# Patient Record
Sex: Female | Born: 1970 | Race: White | Hispanic: No | Marital: Married | Smoking: Never smoker
Health system: Southern US, Community
[De-identification: ages and names within clinical notes are randomized; demographics above are authoritative.]

## PROBLEM LIST (undated history)

## (undated) DIAGNOSIS — R32 Unspecified urinary incontinence: Secondary | ICD-10-CM

## (undated) DIAGNOSIS — G379 Demyelinating disease of central nervous system, unspecified: Secondary | ICD-10-CM

## (undated) DIAGNOSIS — Z9289 Personal history of other medical treatment: Secondary | ICD-10-CM

## (undated) DIAGNOSIS — K219 Gastro-esophageal reflux disease without esophagitis: Secondary | ICD-10-CM

## (undated) DIAGNOSIS — J309 Allergic rhinitis, unspecified: Secondary | ICD-10-CM

## (undated) DIAGNOSIS — R7303 Prediabetes: Secondary | ICD-10-CM

## (undated) DIAGNOSIS — E7800 Pure hypercholesterolemia, unspecified: Secondary | ICD-10-CM

## (undated) DIAGNOSIS — I959 Hypotension, unspecified: Secondary | ICD-10-CM

## (undated) DIAGNOSIS — Q282 Arteriovenous malformation of cerebral vessels: Secondary | ICD-10-CM

## (undated) DIAGNOSIS — F419 Anxiety disorder, unspecified: Secondary | ICD-10-CM

## (undated) DIAGNOSIS — E039 Hypothyroidism, unspecified: Secondary | ICD-10-CM

## (undated) HISTORY — DX: Gastro-esophageal reflux disease without esophagitis: K21.9

## (undated) HISTORY — DX: Unspecified urinary incontinence: R32

## (undated) HISTORY — DX: Personal history of other medical treatment: Z92.89

## (undated) HISTORY — DX: Allergic rhinitis, unspecified: J30.9

## (undated) HISTORY — PX: REFRACTIVE SURGERY: SHX103

## (undated) HISTORY — PX: NASAL SEPTUM SURGERY: SHX37

## (undated) HISTORY — PX: BLADDER SURGERY: SHX569

## (undated) HISTORY — PX: TONSILLECTOMY: SUR1361

## (undated) HISTORY — DX: Pure hypercholesterolemia, unspecified: E78.00

## (undated) HISTORY — DX: Hypothyroidism, unspecified: E03.9

## (undated) HISTORY — PX: MOUTH SURGERY: SHX715

## (undated) HISTORY — DX: Demyelinating disease of central nervous system, unspecified: G37.9

---
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
## 2008-12-01 DIAGNOSIS — G378 Other specified demyelinating diseases of central nervous system: Secondary | ICD-10-CM

## 2008-12-01 DIAGNOSIS — G3789 Other specified demyelinating diseases of central nervous system: Secondary | ICD-10-CM

## 2008-12-01 HISTORY — DX: Other specified demyelinating diseases of central nervous system: G37.89

## 2008-12-01 HISTORY — DX: Other specified demyelinating diseases of central nervous system: G37.8

## 2010-03-31 ENCOUNTER — Ambulatory Visit: Payer: Self-pay | Admitting: Neurology

## 2011-04-26 ENCOUNTER — Ambulatory Visit: Payer: Self-pay | Admitting: Psychiatry

## 2011-05-08 ENCOUNTER — Encounter: Payer: Self-pay | Admitting: Maternal & Fetal Medicine

## 2012-01-19 ENCOUNTER — Emergency Department: Payer: Self-pay | Admitting: Emergency Medicine

## 2012-01-19 LAB — CBC WITH DIFFERENTIAL/PLATELET
Basophil %: 0.4 %
Eosinophil #: 0.2 10*3/uL (ref 0.0–0.7)
Eosinophil %: 1.8 %
HCT: 43.9 % (ref 35.0–47.0)
HGB: 15 g/dL (ref 12.0–16.0)
Lymphocyte %: 21 %
MCHC: 34.2 g/dL (ref 32.0–36.0)
Monocyte %: 9.2 %
Neutrophil #: 7.1 10*3/uL — ABNORMAL HIGH (ref 1.4–6.5)
Neutrophil %: 67.6 %
RBC: 5.02 10*6/uL (ref 3.80–5.20)
RDW: 12.7 % (ref 11.5–14.5)
WBC: 10.5 10*3/uL (ref 3.6–11.0)

## 2012-01-19 LAB — URINALYSIS, COMPLETE
Bacteria: NONE SEEN
Glucose,UR: NEGATIVE mg/dL (ref 0–75)
Leukocyte Esterase: NEGATIVE
Nitrite: NEGATIVE
Protein: NEGATIVE
RBC,UR: 2 /HPF (ref 0–5)
WBC UR: 1 /HPF (ref 0–5)

## 2012-01-19 LAB — COMPREHENSIVE METABOLIC PANEL
Anion Gap: 4 — ABNORMAL LOW (ref 7–16)
Bilirubin,Total: 0.2 mg/dL (ref 0.2–1.0)
Calcium, Total: 8.6 mg/dL (ref 8.5–10.1)
Chloride: 105 mmol/L (ref 98–107)
Co2: 28 mmol/L (ref 21–32)
Creatinine: 0.75 mg/dL (ref 0.60–1.30)
EGFR (African American): >60
EGFR (Non-African Amer.): >60
Osmolality: 274 (ref 275–301)
Potassium: 4.3 mmol/L (ref 3.5–5.1)
Sodium: 137 mmol/L (ref 136–145)
Total Protein: 7.7 g/dL (ref 6.4–8.2)

## 2012-01-19 LAB — LIPASE, BLOOD: Lipase: 177 U/L (ref 73–393)

## 2012-03-15 ENCOUNTER — Ambulatory Visit: Payer: Self-pay | Admitting: Psychiatry

## 2012-11-18 ENCOUNTER — Encounter: Payer: Self-pay | Admitting: Obstetrics & Gynecology

## 2012-11-29 ENCOUNTER — Encounter: Payer: Self-pay | Admitting: Maternal & Fetal Medicine

## 2013-03-17 ENCOUNTER — Encounter: Payer: Self-pay | Admitting: Maternal & Fetal Medicine

## 2013-03-28 LAB — CBC WITH DIFFERENTIAL/PLATELET
Basophil %: 0.5 %
HCT: 35 % (ref 35.0–47.0)
Lymphocyte %: 14.8 %
MCHC: 34.1 g/dL (ref 32.0–36.0)
Monocyte #: 1.2 x10 3/mm — ABNORMAL HIGH (ref 0.2–0.9)
Neutrophil #: 10.4 10*3/uL — ABNORMAL HIGH (ref 1.4–6.5)
Neutrophil %: 74.8 %
Platelet: 241 10*3/uL (ref 150–440)
RDW: 13.7 % (ref 11.5–14.5)
WBC: 13.9 10*3/uL — ABNORMAL HIGH (ref 3.6–11.0)

## 2013-03-29 ENCOUNTER — Inpatient Hospital Stay: Payer: Self-pay

## 2013-03-30 LAB — CBC WITH DIFFERENTIAL/PLATELET
Basophil %: 0.4 %
Eosinophil #: 0 10*3/uL (ref 0.0–0.7)
HGB: 11.4 g/dL — ABNORMAL LOW (ref 12.0–16.0)
Lymphocyte #: 2 10*3/uL (ref 1.0–3.6)
MCH: 30.1 pg (ref 26.0–34.0)
MCV: 86 fL (ref 80–100)
Monocyte #: 1.1 x10 3/mm — ABNORMAL HIGH (ref 0.2–0.9)
Monocyte %: 6.7 %
Neutrophil #: 13.1 10*3/uL — ABNORMAL HIGH (ref 1.4–6.5)
Platelet: 251 10*3/uL (ref 150–440)
RDW: 13.6 % (ref 11.5–14.5)

## 2013-03-31 LAB — CBC WITH DIFFERENTIAL/PLATELET
Basophil #: 0 10*3/uL (ref 0.0–0.1)
Basophil %: 0.2 %
Eosinophil %: 0.7 %
HCT: 31.8 % — ABNORMAL LOW (ref 35.0–47.0)
HGB: 11 g/dL — ABNORMAL LOW (ref 12.0–16.0)
Lymphocyte #: 2.8 10*3/uL (ref 1.0–3.6)
MCH: 29.8 pg (ref 26.0–34.0)
MCHC: 34.6 g/dL (ref 32.0–36.0)
MCV: 86 fL (ref 80–100)
Monocyte %: 9.2 %
Neutrophil %: 69.9 %
Platelet: 232 10*3/uL (ref 150–440)
RDW: 13.9 % (ref 11.5–14.5)
WBC: 14.1 10*3/uL — ABNORMAL HIGH (ref 3.6–11.0)

## 2013-04-01 LAB — CBC WITH DIFFERENTIAL/PLATELET
Basophil #: 0 10*3/uL (ref 0.0–0.1)
Eosinophil #: 0.2 10*3/uL (ref 0.0–0.7)
Eosinophil %: 1.3 %
HCT: 31.9 % — ABNORMAL LOW (ref 35.0–47.0)
Lymphocyte #: 2.8 10*3/uL (ref 1.0–3.6)
Lymphocyte %: 18 %
MCH: 30.3 pg (ref 26.0–34.0)
MCHC: 35.3 g/dL (ref 32.0–36.0)
Monocyte #: 1.3 x10 3/mm — ABNORMAL HIGH (ref 0.2–0.9)
Monocyte %: 8.3 %
Neutrophil #: 11.4 10*3/uL — ABNORMAL HIGH (ref 1.4–6.5)
Neutrophil %: 72.1 %
RBC: 3.71 10*6/uL — ABNORMAL LOW (ref 3.80–5.20)
RDW: 13.7 % (ref 11.5–14.5)

## 2013-04-04 DIAGNOSIS — E034 Atrophy of thyroid (acquired): Secondary | ICD-10-CM | POA: Insufficient documentation

## 2013-04-04 DIAGNOSIS — Q283 Other malformations of cerebral vessels: Secondary | ICD-10-CM | POA: Insufficient documentation

## 2013-06-25 ENCOUNTER — Emergency Department: Payer: Self-pay | Admitting: Emergency Medicine

## 2013-09-06 ENCOUNTER — Ambulatory Visit: Payer: Self-pay

## 2014-04-26 ENCOUNTER — Ambulatory Visit: Payer: Self-pay | Admitting: Psychiatry

## 2014-07-18 DIAGNOSIS — Z8751 Personal history of pre-term labor: Secondary | ICD-10-CM | POA: Insufficient documentation

## 2015-03-20 NOTE — Consult Note (Signed)
Referral Information:   Reason for Referral 44 year old referred for preconception counseling, due to history of mylelitis diagnosis in 2010 following episode of numbness in left hand, arm and torso, which resolved with steroid therapy.  Her lumbar puncture and visual evoked response were normal.  She does not have a diagnosis of multiple sclerosis, however, is followed closely by neurology.  MRI demonstrated 2 lesions in the right cerebellum consistent with hemangiomas and or vascular malformations.    Referring Physician Dr. Rossie Muskrat    Prenatal Hx noncontributorn    Past Obstetrical Hx nulliparous   Allergies:   Sulfa drugs: Other  Perinatal Consult:   PGyn Hx regular menses    PMed Hx Hx of varicella    Past Medical History cont'd 1. Hashimotos Thyoriditis 2. Seasonal Allergies 3. Vitamin B12 Deficiency 4. Elevated BMI (35) 5.    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 vascular malformation, and the other c/w a venous angioma--we addressed the need for neurosurgical evaluation prior to preganncy and surgical correction prior to conception, if indicated.   We also addressed the risk for bleeding and rupture of during pregnancy.  I could not determine the size of these lesions based on available records, however, we addressed the risk of rupture during pregnancy due to increase in engorgement and friability of vessels during pregnancy.  We addressed the need to determine  whether she would be at risk during labor and whether or not valsalva would increase her risk for rupture. 2. 1 episode of myelitis/branstem syndrome in 2000--diagnosed following transient los of function/numbness in left hand/arm/torso--resolved with steroids, subsequent evaluations normal, no evidence of multiple sclerosis 3. History of Hashimotos Thyroiditis--we reviewed the risks associated with growth disturbances and even fetal goiter in the setting of hashmotos.  We reviewed surveillance with close monitoring of maternal thyroid function, as well as evaluation for fetal growth. 4. Vitamin D deficiency, on replacement 5. Seasonal allergies---has required steroid use in past 6. Works with organic solvents during theater work, but uses Herbalist for safety purposes.   7. AMA--we adressed the concerns for aneuploidy. 8. She has had varicella, but is unsure of other vaccines.  She is taking folic acid and will continue to do so.  9. elevated BMI--we addressed risks for gestational diabetes, preeclampsia, and operative delivery in setting of elevated bmi, as well as IOM recommendations for weight gain during pregnancy to reduce risks.    Recommendations 1.  Recommend neurosurgical evaluation of her cerebellar vascular lesions prior to pregnancy.  If correction/treatment is recommended, I advised her to have this performed prior to pregnancy. If she becomes pregnant prior to any intervention, we addressed the need to determine her risks associated with labor/valsalva.  We addressed the options for cesarean delivery in this setting v. laboring down followed by an assisted second stage of labor. 2. history of myelitis--we addressed safety of steroid therapy during pregnancy and reviewed fact that pregnancy not associated with recurrence of this condition 3. Thyroiditis--we addressed surveillance in pregnancy with growth scans and  assessment for fetal goiter, as well as 30% increase in thyroid  replacement during pregnancy and thyroid function testing q trimester. 4. Vitamin D deiciency--she is on replacement and will continue during pregnancy 5. Seasonal allergies--her current regimen is acceptable to continue during pregnancy 6. Exposure to paints and solvents--she will review any specific pregnancy-associated precautions regarding exposures, however, does use respirator during exposure to solvents used during theatre work 7. AMA--we addressed the risk for aneuploidy and recommend she meet with our genetic counselors during pregnancy for additional family history screening and to review all options available for prenatal screening/diagnosis. 8 Gen: she will continue folic acid, will check on her rubella status and will request tdap if not already obtained 9. She is working on weight reduction and I adivised her to attempt a 10% weight reduction prior to conception   Plan:   Genetic Counseling yes     Total Time Spent with Patient 45 minutes    Office Use Only [id number]  Level 3 (47min) NEW office consult detailed   Coding Description: MATERNAL CONDITIONS/HISTORY INDICATION(S).   OTHER: history of intracranial vascular malformation.  Electronic Signatures: Pearletha Furl Investment banker, corporate)  (Signed 13-Dec-13 14:24)  Authored: Referral, Allergies, Consult, Exam, Impression, Plan, Billing, Coding Description   Last Updated: 13-Dec-13 14:24 by Pearletha Furl (RN)

## 2015-03-20 NOTE — Consult Note (Signed)
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 She was seen by Dr. Diamantina Monks in our office on 05/08/11 for prepregnancy consultation (see that separate dictation),  In 2010, she had numbness that began in her left upper extremity that progressed to her left upper torso. She maintained normal strength and senstation. Her workup demonstrated only a "plaque" within her cerebellum and her symptoms quickly resolved with steroids. 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  She has never had symptoms since.    During the workup of her left upper extremity numbness, she was found to have an incidental finding of two venous malformations within the cerebellum. She was told that these likely were not the cause of her numbness symptoms. She does not have history of headaches or weakness.  She was seen by two neurosurgeons prior to becoming pregnant (Dr. Harrison [facility] and Dr. Alfred Levins [facility] neuro[facility] in [place]).  We have requested their notes but the patient states that both felt that the size/location of the lesions did not require surgical correction or embolization.  She also said that they both felt that pregnancy likely would not increase the risk for rupture above her baseline risk.  MRI of the brain from [facility] on 03/15/12 demonstrated two stable lesions in the right cerrebellar hemisphere (previously measured 1.1 cm and 1.3 cm.)  Stephanie Shields denies headache, blurry vision, trouble with gait/speech or any other neurologic symptom. .    Past Obstetrical Hx G1 P0 -Conceived on clomid.  Viability ultrasound pending.   Home Medications: Medication Instructions Status  Prenatal Multivitamins oral tablet 1 tab(s) orally once a day Active  Vitamin D2 1.25 milligram(s) orally 2 times a week Active  Armour Thyroid 60 mg oral tablet 1 tab(s) orally once a day Active   Allergies:   Sulfa drugs: Other  Perinatal Consult:   PGyn Hx Denies history of abnormal paps/STDs    Past Medical History cont'd PMH: 1. Cerrebellal venous malformation as above 2. Hypothyroid 3. Vitamin D deficiency 4. Advanced Maternal Age 2. Episode of left upper extremity numbess (see above, responded to steroids) 6. Elevated BMI    PSurg Hx Wisdom teeth, bladder urethra dilation (age 44), Tonsillectomy, Lasix eye surgery    FHx Maternal first cousin with Down Syndrome. 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 No headaches, blurry vision, speech/gait difficulties. No abdominal pain. No bleeding.    Fever/Chills No    Cough No    Abdominal Pain No    Nausea/Vomiting No    SOB/DOE No    Chest Pain No    Dysuria No    Tolerating Diet Yes     Additional Lab/Radiology Notes EKG (11/11/12): Normal sinus rhythm. Normal EKG. Thyroid function ([facility] 11/10/12): TSH 3.56 (0.34-5.6), Free T4 0.78 (0.66-1.14)  MRI brain [facility] 04/26/11): 1.3 cm and 1.1 cm right cerrebellar lesions consistent with vascular malformations. No change in  size when compared to scans from [facility] on 03/31/10 and 07/03/09. MRI brain [facility] 03/15/12): Stable right cerrebellar lesions consitent with vascular lesion MRI spine [facility] 03/15/12): No evidence of MS. .   Impression/Recommendations:   Impression 44 year-old G1 P0 at 5 4/7 weeks (no ultrasound yet) with history of Advanced Maternal Age, hypothyroidism,  Vitamin D Deficiency, episode of left extremity numbness in 2010 of unclear etiology and two stable lesions in the right cerrebellum thought to be venous malformations.    Recommendations 1. Advanced Maternal Age. We discussed the association of aneuploidy with Advanced Maternal Age and the availability of genetic counseling. We also discussed the available screening and diagnostic testing options that are available. We have scheduled a genetic counseling consultation to be done after her viabiltity scan which is scheduled for next week.  We also discussed the association of medical complications of pregnancy with Advanced Maternal Age. Given her elevated BMI, an early glucola could be considered to screen for pre-existing type II diabetes.   2. Hypothyroidism. She is well-controlled on her synthroid and is followed by an endocrinologist, Dr. Ronnald Collum. We discussed the importance of thyroid hormone for normal fetal neurologic development and fetal growth. She will contact her endocrinologist to alert them of her pregnancy. Our group's practice has been to increase synthroid by an extra two pills (at her current dose) per week. Please check thyroid function tests at least every trimester and follow fetal growth with ultrasound.  3. Vitamin D deficiency. She is currently taking supplementation.  4. History of single episode of left extremity numbness that responded to steroids. No recurrent episodes. Does not have a diagnosis of MS but was told that if the symptoms returned that there would be concern for MS. Her most recent MRI of the spine was normal.  MS is thought to improve during pregnancy but there is risk for exacerbations postpartum.  Women with MS also have increased risk for cesarean delivery and low birth weight.   ...continued below...     Comments ...continued from above...  5. Two stable cerrebellar lesions, thought to be venous malformations.  It is reasuring that the size of the lesions  have remained stable over the last 3 years. The worry is for rupture during pregnancy in the setting of the increased blood volume of pregnancy. She has seen two neurosurgeons, both of which did not recommend resection or embolization.  They also both told her that they would not recommend against pregnancy.  A Review of the literature suggests that brain vascular malformations have the same risk of rupture during pregnancy as compared to non-pregnancy, though I did explain that the literature is limited to small series.  We reviewed signs and symptoms of rutpure and need for emergent medical attention. Furthermore, it is unclear if valsalva during the second stage increases the risk of rupture. An assisted second stage seems reasonable.  Recs: -We have scheduled an appointment with genetic counseling to review Advanced Maternal Age  -Early glucola to screen for type II diabetes in setting of Advanced Maternal Age and elevated maternal BMI -Continue synthroid. Check TFTs at least every trimester. Stephanie Shields will touch base with her endocrinologist concerning dose adjustment in pregnancy. Our group's practice has been to increase her weekly dose by two pills per week -Detailed anatomy scan at approximately 18 weeks. We will be happy to perform if desired -Follow fetal growth during pregnancy with ultrasound (hypothryroid, Advanced Maternal Age, elevated BMI) -We have requested the consult notes  from both neurosurgeons that saw Stephanie Shields to determine if there were any other recommendations.  We will update this consult note once those notes are received. Assisted second stage is reasonable but again it is not known if valsalva during the second stage increases the risk for rupture of cerebral venous malformations.     Total Time Spent with Patient 60 minutes    Office Use Only 99244  Level 4 (90min) NEW office consult low complexity   Coding Description: MATERNAL CONDITIONS/HISTORY INDICATION(S).   Adv  Maternal Age- primigravida.   Thyroid dysfunction in pregnancy.   OTHER: Cerebral venous malformations.  Electronic Signatures: Chastity Noland, [place] (MD)  (Signed 19-Dec-13 13:49)  Authored: Referral, Home Medications, Allergies, Consult, Exam, Lab/Radiology Notes, Impression, Other Comments, Billing, Coding Description   Last Updated: 19-Dec-13 13:49 by Helana Macbride, [place] (MD)

## 2015-03-23 NOTE — Consult Note (Signed)
Referral Information:  Reason for Referral Pregnant with history of PPROM at ~19 weeks, cerebral venous malformation, Advanced Maternal Age and hypothyroidism   Referring Physician [facility]   Prenatal Hx Stephanie Shields is a 44 year-old G1 P0 at 21/3 4/7 weeks by LMP. She was previously seen by our group for prepregnancy in 2012 and again during the first trimeseter of pregnancy.  She has subsequently seen Neurology and advised pregnancy and delivery should not impact her brain lesion.   Most recently, she reports onset of watery discharge at 19 weeks.  She was seen in the office following this episode and noted to have oligohydramnios and PPROM.  Her most recent ultrasound (03/14/13) demonstrated an AFI of 7.26. She is accompained by her husband, Randall Hiss, today.   Past Obstetrical Hx G1 P0 -Conceived on clomid. Viability ultrasound pending.   Home Medications: Medication Instructions Status  Prenatal Multivitamins oral tablet 1 tab(s) orally once a day Active  Dulcolax Stool Softener sodium 100 mg oral capsule 1  orally 2 times a day, As Needed Active  Vitamin D2 1.25 milligram(s) orally 2 times a week Active  Armour Thyroid 60 mg oral tablet 1 tab(s) orally once a day Active   Allergies:   Sulfa drugs: Other  Vital Signs/Notes:  Nursing Vital Signs: **Vital Signs.:   17-Apr-14 11:07  Vital Signs Type Routine  Temperature Temperature (F) 98  Celsius 36.6  Temperature Source oral  Pulse Pulse 69  Respirations Respirations 18  Systolic BP Systolic BP 962  Diastolic BP (mmHg) Diastolic BP (mmHg) 62  Mean BP 76   Perinatal Consult:  PGyn Hx Denies history of abnormal paps/STDs   Past Medical History cont'd PMH: 1. Cerrebellal venous malformation as above 2. Hypothyroid 3. Vitamin D deficiency 4. Advanced Maternal Age 16. Episode of left upper extremity numbess (see previous consults responded to steroids) 6. Elevated BMI   PSurg Hx Wisdom teeth, bladder urethra dilation  (age 89), Tonsillectomy, Lasix eye surgery   FHx Maternal first cousin with Down Syndrome. 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 No headaches, blurry vision, speech/gait difficulties. No abdominal pain. No bleeding.   Fever/Chills No   Cough No   Abdominal Pain No   Nausea/Vomiting No   SOB/DOE No   Chest Pain No   Dysuria No   Tolerating Diet Yes    Additional Lab/Radiology Notes EKG (11/11/12): Normal sinus rhythm. Normal EKG. Thyroid function ([facility] 11/10/12): TSH 3.56 (0.34-5.6), Free T4 0.78 (0.66-1.14)  MRI brain [facility] 04/26/11): 1.3 cm and 1.1 cm right cerrebellar lesions consistent with vascular malformations. No change in size when compared to scans from [facility] on 03/31/10 and 07/03/09. MRI brain [facility] 03/15/12): Stable right cerrebellar lesions consitent with vascular lesion MRI spine [facility] 03/15/12): No evidence of MS. .   Impression/Recommendations:  Impression 44 year-old G1 P0 at 21 weeks 3 days here to address midtrimester PPROM since [redacted] weeks gestation.   History also significant for Advanced Maternal Age, hypothyroidism, Vitamin D Deficiency, episode of left extremity numbness in 2010 of unclear etiology and two stable lesions in the right cerrebellum thought to be venous malformations.  Please see previous consultations for details.  We addressed concerns related to midtrimester PPROM.  We addressed the possible mechanisms--cervical weakness leading to  inflammation from ascending bacteria (subclinical inflammation) and subsequent rupture of membranes, trauma/invasive procedures and bleeding (she did not report this) which sometimes leads to weaking of membranes and rupture.   We addressed the following concerns related to  midtrimester PPROM: --30-50% risk for infection and need for delivery to avoid maternal sepsis. we addressed avoidance of digital exams in order to prologn latency --risk for abruption (~40%) with severe abruption resulting in preterm delivery/uterine activity or stillbirth --risk for stillbirth/iufd (30%) due largely to infection and/or abruption --risk of pulm hypoplasia (particularly if mid tri amniotic fluid pocket measurs <2cm) and ~80% mortality in setting of pulmonary hypoplasia --possiblity that membranes 'reseal' (approx 20%) --we addressed the concerns related to periviability (23w) and morbidity/mortality associated with fetuses born at or above 24w.  We also addressed  possible admission (in the absence of infection, labor etc)  to the hospital at a gestational age when she and her husband would be comfortable with intervention for fetal benefit (generally that gestational age is considered 53 weeks--we addresed likelihood a cesarean delivery at 24 weeks would be a classical cs) --She is aware of signs of infection, preterm labor and abruption and knows to present to [facility] if these occur before 24weeks.  --She shows no sign of infection, preterm labor, or abruption at this time.   Recommendations 1. PPROM since [redacted] weeks gestation.   She has met with Dr. Tona Sensing from Neonatology and appears to have good understanding of what to expect in terms of ICU care for a premature infant and expectations for survival/morbidity at early gestational ages.   --She and her husband would like to be admitted to [facility] at ~[redacted] weeks gestation, if her condition remains stable.  She will continue to do daily temperature checks and will monitor for signs of bleeding or preterm labor --If transfer of care is desired prior to 24 weeks, please contact our office and we can establish care.  At this point, it is reasonable to continue outpatient care.   Plan:  Ultrasound at what gestational ages weekly afi    (Removed):     Total Time Spent with Patient 45 minutes   >50% of visit spent in couseling/coordination of care yes   Office Use Only [id number]  Office Visit Level 4 (84mn) EST detailed office/outpt   Coding Description: FETAL - 2nd/3rd TRIMESTER INDICATION(S).   OTHER: preterm premature rupture of membranes.  Electronic Signatures: SManfred Shirts(MD)  (Signed 17-Apr-14 13:00)  Authored: Referral, Home Medications, Allergies, Vital Signs/Notes, Consult, Exam, Lab/Radiology Notes, Impression, Plan, Other Comments, Billing, Coding Description   Last Updated: 17-Apr-14 13:00 by SManfred Shirts(MD)

## 2015-04-10 NOTE — H&P (Signed)
L&D Evaluation:  History:  HPI 77 G1P0 at 40 +0 weeks with PPROM for the last 4 weeks . Pt c/o vaginal bleeding tonight. No cramping. some additional gush of fluid.   Patient's Medical History cerebral venous malformation, AMA, obesity, hypothyroid   Patient's Surgical History urethral dilation, wisdom tooth, lasix , tonsilectomy   Medications armour 60 mg , pnv   Allergies Sulfa   Social History none   Family History Non-Contributory   ROS:  ROS All systems were reviewed.  HEENT, CNS, GI, GU, Respiratory, CV, Renal and Musculoskeletal systems were found to be normal.   Exam:  Vital Signs stable  afebrile106/59  p=64   General no apparent distress   Mental Status clear   Chest clear   Heart normal sinus rhythm   Abdomen gravid, non-tender   Fetal Position vtx   Back no CVAT   Edema no edema   Reflexes 1+   Pelvic cervix closed and thick, Pink d/c on glove   FHT 140   Ucx absent   Skin dry   Impression:  Impression PPROM, vaginal bleeding. Nl FHT   Plan:  Plan observaton , IVF, cbc   Electronic Signatures: Tashonda Pinkus, Gwen Her (MD)  (Signed 28-Apr-14 00:08)  Authored: L&D Evaluation   Last Updated: 28-Apr-14 00:08 by Venicia Vandall, Gwen Her (MD)

## 2015-05-20 DIAGNOSIS — N179 Acute kidney failure, unspecified: Secondary | ICD-10-CM | POA: Insufficient documentation

## 2015-08-07 ENCOUNTER — Other Ambulatory Visit: Payer: Self-pay | Admitting: Psychiatry

## 2015-08-07 DIAGNOSIS — G35 Multiple sclerosis: Secondary | ICD-10-CM

## 2015-08-13 ENCOUNTER — Ambulatory Visit
Admission: RE | Admit: 2015-08-13 | Discharge: 2015-08-13 | Disposition: A | Discharge status: Home / Self Care | Payer: BLUE CROSS/BLUE SHIELD | Source: Ambulatory Visit | Attending: Psychiatry | Admitting: Psychiatry

## 2015-08-13 DIAGNOSIS — G35 Multiple sclerosis: Secondary | ICD-10-CM

## 2015-08-13 MED ORDER — GADOBENATE DIMEGLUMINE 529 MG/ML IV SOLN
20.0000 mL | Freq: Once | INTRAVENOUS | Status: AC | PRN
  Administered 2015-08-13: 20 mL via INTRAVENOUS

## 2015-11-19 ENCOUNTER — Encounter: Payer: Self-pay | Admitting: Family Medicine

## 2015-11-19 ENCOUNTER — Ambulatory Visit (INDEPENDENT_AMBULATORY_CARE_PROVIDER_SITE_OTHER): Payer: BLUE CROSS/BLUE SHIELD | Admitting: Family Medicine

## 2015-11-19 VITALS — BP 114/68 | HR 65 | Temp 98.2°F | Ht 66.5 in | Wt 232.0 lb

## 2015-11-19 DIAGNOSIS — E039 Hypothyroidism, unspecified: Secondary | ICD-10-CM

## 2015-11-19 DIAGNOSIS — K219 Gastro-esophageal reflux disease without esophagitis: Secondary | ICD-10-CM | POA: Diagnosis not present

## 2015-11-19 NOTE — Patient Instructions (Signed)
Nice to meet you. Please continue the [place] for your reflux. You can add back as needed Zantac or Mylanta. If this does not help we may need to consider changing your reflux medication.  We will check your thyroid function today.  If you develop chest pain, shortness of breath, sweating , palpitations , abdominal pain, blood in her stool, or any new or changing symptoms please seek medical attention.

## 2015-11-19 NOTE — Progress Notes (Signed)
Pre visit review using our clinic review tool, if applicable. No additional management support is needed unless otherwise documented below in the visit note. 

## 2015-11-20 ENCOUNTER — Encounter: Payer: Self-pay | Admitting: Family Medicine

## 2015-11-20 DIAGNOSIS — E039 Hypothyroidism, unspecified: Secondary | ICD-10-CM | POA: Insufficient documentation

## 2015-11-20 DIAGNOSIS — K219 Gastro-esophageal reflux disease without esophagitis: Secondary | ICD-10-CM | POA: Insufficient documentation

## 2015-11-20 LAB — TSH: TSH: 0.54 u[IU]/mL (ref 0.35–4.50)

## 2015-11-20 NOTE — Assessment & Plan Note (Signed)
Appears stable on Synthroid. We will check a TSH today

## 2015-11-20 NOTE — Assessment & Plan Note (Signed)
Symptoms most consistent with reflux. Has had this for a number of years. Unlikely to be related to cardiac cause or VTE. Unlikely to be related pulmonary cause as well. Discussed continuing her current PPI versus switching to a new PPI. Patient opted to continue her current PPI. Discussed that she could add back Zantac if her reflux is more bothersome. We discussed dietary changes and foods to monitor. Also discussed having her go back to see GI if her reflux does not continue to improve. Given return precautions.

## 2015-11-20 NOTE — Progress Notes (Signed)
Patient ID: Stephanie Shields, female   DOB: Jun 21, 1971, 43 y.o.   MRN: [id number]  [name], MD Phone: [phone]  Stephanie Shields is a 44 y.o. female who presents today for new patient visit.  GERD: Patient notes since 2010 she's had issues with heartburn. She describes her heartburn as a burning sensation in her central chest. She occasionally gets sharp pains on the outside of her chest with this. She also notes she occasionally gets sensation of fullness in her central chest following eating and then she needs to burp and it improves. The sharp discomforts are less than a second at a time. The fullness always improves with burping. Only gets symptoms following eating. She denies shortness of breath, exertional component, diaphoresis, history of PE and DVT, long trips, and recent surgeries. She notes the issue has been unchanged since 2010. Does note occasionally she will get flareups that will respond to Mylanta. She's been on dexilant for several years. She has been on Zantac in addition to this as well the past. She now she had EGD in 2015 that she reports was normal. Also reports she has had several EKGs that have been normal in the past. Notes recently it is mildly worsened. She's been using Mylanta for this. She has not particularly identified any foods that bother her.  Hypothyroidism: Patient notes this is related to Hashimoto's thyroiditis. She had been on [place] Thyroid in the past. Recently she was switched to Synthroid 150 g daily. She has tolerated this well. Her TSH is always been normal. She denies skin changes, palpitations, weight changes, and hair changes.  Active Ambulatory Problems    Diagnosis Date Noted  . Hypothyroidism 11/20/2015  . GERD (gastroesophageal reflux disease) 11/20/2015   Resolved Ambulatory Problems    Diagnosis Date Noted  . No Resolved Ambulatory Problems   Past Medical History  Diagnosis Date  . Allergic rhinitis   . Elevated cholesterol   .  History of blood transfusion   . Urinary incontinence   . 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 Arthritis      Parent, Grandparent  . Prostate cancer      Grandparent  . Hyperlipidemia      Parent  . Heart disease      Grandparent  . Stroke      Grandparent  . Diabetes      Parent    Social History   Social History  . Marital Status: Married    Spouse Name: N/A  . Number of Children: N/A  . Years of Education: N/A   Occupational History  . Not on file.   Social History Main Topics  . Smoking status: Never Smoker   . Smokeless tobacco: Not on file  . Alcohol Use: 0.0 oz/week    0 Standard drinks or equivalent per week  . Drug Use: No  . Sexual Activity: Not on file   Other Topics Concern  . Not on file   Social History Narrative    ROS  General:  Negative for unexplained weight loss, fever Skin: Negative for new or changing mole, sore that won't heal HEENT: Negative for trouble hearing, trouble seeing, ringing in ears, mouth sores, hoarseness, change in voice, dysphagia. CV:  Negative for chest pain, dyspnea, edema, palpitations Resp: Negative for cough, dyspnea, hemoptysis GI: Positive for GERD, Negative for nausea, vomiting, diarrhea, constipation, abdominal pain, melena, hematochezia. GU: Negative for dysuria,  incontinence, urinary hesitance, hematuria, vaginal or penile discharge, polyuria, sexual difficulty, lumps in testicle or breasts MSK: Negative for muscle cramps or aches, joint pain or swelling Neuro: Negative for headaches, weakness, numbness, dizziness, passing out/fainting Psych: Negative for depression, anxiety, memory problems    Objective  Physical Exam Filed Vitals:   11/19/15 1609  BP: 114/68  Pulse: 65  Temp: 98.2 F (36.8 C)    BP Readings from Last 3 Encounters:  11/19/15 114/68   Wt Readings from Last 3 Encounters:  11/19/15 232 lb (105.235 kg)  08/13/15  226 lb (102.513 kg)    Physical Exam  Constitutional: She is well-developed, well-nourished, and in no distress.  HENT:  Head: Normocephalic and atraumatic.  Right Ear: External ear normal.  Left Ear: External ear normal.  Mouth/Throat: Oropharynx is clear and moist. No oropharyngeal exudate.  Eyes: Conjunctivae are normal.  Neck: Neck supple. No thyromegaly present.  Cardiovascular: Normal rate, regular rhythm and normal heart sounds.  Exam reveals no gallop and no friction rub.   No murmur heard. Pulmonary/Chest: Effort normal and breath sounds normal. No respiratory distress. She has no wheezes. She has no rales.  Abdominal: Soft. Bowel sounds are normal. She exhibits no distension. There is no tenderness. There is no rebound and no guarding.  Lymphadenopathy:    She has no cervical adenopathy.  Neurological: She is alert. Gait normal.  Skin: Skin is warm and dry. She is not diaphoretic.  Psychiatric: Mood and affect normal.     Assessment/Plan:   Hypothyroidism Appears stable on Synthroid. We will check a TSH today  GERD (gastroesophageal reflux disease) Symptoms most consistent with reflux. Has had this for a number of years. Unlikely to be related to cardiac cause or VTE. Unlikely to be related pulmonary cause as well. Discussed continuing her current PPI versus switching to a new PPI. Patient opted to continue her current PPI. Discussed that she could add back Zantac if her reflux is more bothersome. We discussed dietary changes and foods to monitor. Also discussed having her go back to see GI if her reflux does not continue to improve. Given return precautions.    Orders Placed This Encounter  Procedures  . TSH   Dragon voice recognition software was used during the dictation process of this note. If any phrases or words seem inappropriate it is likely secondary to the translation process being inefficient.  [name]

## 2015-12-07 ENCOUNTER — Encounter: Payer: Self-pay | Admitting: Family Medicine

## 2015-12-10 ENCOUNTER — Encounter: Payer: Self-pay | Admitting: Family Medicine

## 2015-12-21 ENCOUNTER — Encounter: Payer: Self-pay | Admitting: Family Medicine

## 2015-12-21 ENCOUNTER — Ambulatory Visit (INDEPENDENT_AMBULATORY_CARE_PROVIDER_SITE_OTHER): Payer: BLUE CROSS/BLUE SHIELD | Admitting: Family Medicine

## 2015-12-21 VITALS — BP 118/66 | HR 72 | Temp 98.6°F | Ht 66.25 in | Wt 233.0 lb

## 2015-12-21 DIAGNOSIS — K219 Gastro-esophageal reflux disease without esophagitis: Secondary | ICD-10-CM

## 2015-12-21 DIAGNOSIS — M2550 Pain in unspecified joint: Secondary | ICD-10-CM

## 2015-12-21 MED ORDER — DEXLANSOPRAZOLE 60 MG PO CPDR: 60.0000 mg | DELAYED_RELEASE_CAPSULE | Freq: Every day | ORAL | Status: DC

## 2015-12-21 NOTE — Progress Notes (Signed)
Pre visit review using our clinic review tool, if applicable. No additional management support is needed unless otherwise documented below in the visit note. 

## 2015-12-21 NOTE — Patient Instructions (Signed)
Nice to see you. Please continued dexilant. Please monitor your hands. If you develop abdominal pain, blood in your stool, or worsening reflux please let us know.

## 2015-12-22 DIAGNOSIS — M2550 Pain in unspecified joint: Secondary | ICD-10-CM | POA: Insufficient documentation

## 2015-12-22 NOTE — Progress Notes (Signed)
Patient ID: Stephanie Shields, female   DOB: [birth year], 45 y.o.   MRN: [id number]  [name], MD Phone: [phone]  Stephanie Shields is a 45 y.o. female who presents today for follow-up.  GERD: Patient notes this is improved. She has only used Mylanta a couple of times. She's been more careful about what she eats. Has avoided acidic and citrus foods. Watching how much chocolate she takes in and cutting back on spicy foods as well. She rarely notes a burning reflux sensation. Takes her dexilant most days. No blood in her stool or recent pneumonias.  Patient also notes some achiness in her bilateral hands. She notes there is minimal swelling associated with this. She notes this tends to occur when she uses her hands more at work. Also the swelling comes on when she takes in an increased amount of salt. She denies any weakness. She does note some positional numbness particularly if she is holding her hands with her arms flexed. The numbness in the lateral aspect of her hand.  PMH: nonsmoker.   ROS see history of present illness  Objective  Physical Exam Filed Vitals:   12/21/15 1557  BP: 118/66  Pulse: 72  Temp: 98.6 F (37 C)    BP Readings from Last 3 Encounters:  12/21/15 118/66  11/19/15 114/68   Wt Readings from Last 3 Encounters:  12/21/15 233 lb (105.688 kg)  11/19/15 232 lb (105.235 kg)  08/13/15 226 lb (102.513 kg)    Physical Exam  Constitutional: She is well-developed, well-nourished, and in no distress.  HENT:  Head: Normocephalic and atraumatic.  Cardiovascular: Normal rate, regular rhythm and normal heart sounds.  Exam reveals no gallop and no friction rub.   No murmur heard. Pulmonary/Chest: Effort normal and breath sounds normal. No respiratory distress. She has no wheezes. She has no rales.  Abdominal: Soft. Bowel sounds are normal. She exhibits no distension. There is no tenderness. There is no rebound and no guarding.  Musculoskeletal:  No swelling of  the soft tissue in the hands or the joints of the hands, no joint tenderness or soft tissue tenderness in the hands, hands are warm and well-perfused  Neurological: She is alert.  5 out of 5 strength in bilateral biceps, triceps, grip, sensation light touch intact in bilateral upper extremities  Skin: Skin is warm and dry. She is not diaphoretic.     Assessment/Plan: Please see individual problem list.  GERD (gastroesophageal reflux disease) Improved. Benign abdominal exam. We'll continue dexilant. Monitor for worsening symptoms.  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 Has benign exam today. She is neurologically and vascularly intact. Suspect numbness is positional in nature and sounds as though does ulnar nerve related. Discussed that if this is persistent she needs let us know. We will continue to monitor and if recurs consider further testing.    Meds ordered this encounter  Medications  . dexlansoprazole (DEXILANT) 60 MG capsule    Sig: Take 1 capsule (60 mg total) by mouth daily.    Dispense:  90 capsule    Refill:  [place]

## 2015-12-22 NOTE — Assessment & Plan Note (Signed)
Improved. Benign abdominal exam. We'll continue dexilant. Monitor for worsening symptoms.

## 2015-12-22 NOTE — Assessment & Plan Note (Addendum)
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 Has benign exam today. She is neurologically and vascularly intact. Suspect numbness is positional in nature and sounds as though does ulnar nerve related. Discussed that if this is persistent she needs let us know. We will continue to monitor and if recurs consider further testing.

## 2016-02-19 ENCOUNTER — Ambulatory Visit (INDEPENDENT_AMBULATORY_CARE_PROVIDER_SITE_OTHER): Payer: BLUE CROSS/BLUE SHIELD | Admitting: Family Medicine

## 2016-02-19 ENCOUNTER — Encounter: Payer: Self-pay | Admitting: Family Medicine

## 2016-02-19 VITALS — BP 102/78 | HR 73 | Temp 98.1°F | Wt 227.2 lb

## 2016-02-19 DIAGNOSIS — J3489 Other specified disorders of nose and nasal sinuses: Secondary | ICD-10-CM | POA: Diagnosis not present

## 2016-02-19 MED ORDER — PREDNISONE 50 MG PO TABS: ORAL_TABLET | ORAL | Status: DC

## 2016-02-19 MED ORDER — AZELASTINE-FLUTICASONE 137-50 MCG/ACT NA SUSP: NASAL | Status: DC

## 2016-02-19 NOTE — Progress Notes (Signed)
Pre visit review using our clinic review tool, if applicable. No additional management support is needed unless otherwise documented below in the visit note. 

## 2016-02-19 NOTE — Patient Instructions (Signed)
This is allergic in nature.  Use the nasal spray in lieu of flonase.  Take the prednisone as prescribed.  Take care  Dr. Lacinda Axon

## 2016-02-20 DIAGNOSIS — J3489 Other specified disorders of nose and nasal sinuses: Secondary | ICD-10-CM | POA: Insufficient documentation

## 2016-02-20 NOTE — Progress Notes (Signed)
   Subjective:  Patient ID: Stephanie Shields, female    DOB: 11/03/1971  Age: 45 y.o. MRN: [id number]  CC: Sinus pressure/pain  HPI:  45 year old female presents to clinic today with the above complaint.  Sinus pressure/pain  Reports a 2 week history of sinus pressure/pain.  No associated fever.  She reports recent discolored mucous when blowing her nose. This has improved recently.  She has been using an antihistamine intermittently and flonase with no resolution.  No known exacerbating factors.   No other complaints today.  Social Hx   Social History   Social History  . Marital Status: Married    Spouse Name: N/A  . Number of Children: N/A  . Years of Education: N/A   Social History Main Topics  . Smoking status: Never Smoker   . Smokeless tobacco: None  . Alcohol Use: 0.0 oz/week    0 Standard drinks or equivalent per week  . Drug Use: No  . Sexual Activity: Not Asked   Other Topics Concern  . None   Social History Narrative   Review of Systems  Constitutional: Negative.   HENT: Positive for sinus pressure.    Objective:  BP 102/78 mmHg  Pulse 73  Temp(Src) 98.1 F (36.7 C) (Oral)  Wt 227 lb 4 oz (103.08 kg)  SpO2 96%  BP/Weight 02/19/2016 12/21/2015 0000000  Systolic BP [id number] 123456 [id number]  Diastolic BP 78 66 68  Wt. (Lbs) 227.25 233 232  BMI 36.39 37.31 36.89   Physical Exam  Constitutional: She appears well-developed. No distress.  HENT:  Head: Normocephalic and atraumatic.  Mouth/Throat: Oropharynx is clear and moist.  Normal TM's bilaterally. Nose - normal. Sinuses nontender to palpation.  Eyes: Conjunctivae are normal.  Neck: Neck supple.  Cardiovascular: Normal rate and regular rhythm.   Pulmonary/Chest: Effort normal and breath sounds normal.  Lymphadenopathy:    She has no cervical adenopathy.  Vitals reviewed.  Lab Results  Component Value Date   WBC 15.8* 04/01/2013   HGB 11.3* 04/01/2013   HCT 31.9* 04/01/2013   PLT 238  04/01/2013   GLUCOSE 92 01/19/2012   ALT 29 01/19/2012   AST 21 01/19/2012   NA 137 01/19/2012   K 4.3 01/19/2012   CL 105 01/19/2012   CREATININE 0.75 01/19/2012   BUN 15 01/19/2012   CO2 28 01/19/2012   TSH 0.54 11/19/2015    Assessment & Plan:   Problem List Items Addressed This Visit    Sinus pressure - Primary    New Problem. Appears to be allergic in nature. Treating with Dymista. Given duration of problem, also given prednisone to expedite improvement.         Meds ordered this encounter  Medications  . Azelastine-Fluticasone 137-50 MCG/ACT SUSP    Sig: 1 spray in each nostril twice daily.    Dispense:  23 g    Refill:  1  . predniSONE (DELTASONE) 50 MG tablet    Sig: 1 tablet daily x 5 days.    Dispense:  5 tablet    Refill:  0    Follow-up: PRN  [place]

## 2016-02-20 NOTE — Assessment & Plan Note (Addendum)
New Problem. Appears to be allergic in nature. Treating with Dymista. Given duration of problem, also given prednisone to expedite improvement.

## 2016-03-18 DIAGNOSIS — J029 Acute pharyngitis, unspecified: Secondary | ICD-10-CM | POA: Diagnosis not present

## 2016-03-19 DIAGNOSIS — R21 Rash and other nonspecific skin eruption: Secondary | ICD-10-CM | POA: Diagnosis not present

## 2016-03-19 DIAGNOSIS — J020 Streptococcal pharyngitis: Secondary | ICD-10-CM | POA: Diagnosis not present

## 2016-03-20 ENCOUNTER — Encounter: Payer: Self-pay | Admitting: Family Medicine

## 2016-03-20 ENCOUNTER — Ambulatory Visit (INDEPENDENT_AMBULATORY_CARE_PROVIDER_SITE_OTHER): Payer: BLUE CROSS/BLUE SHIELD | Admitting: Family Medicine

## 2016-03-20 VITALS — BP 118/72 | HR 70 | Temp 97.9°F | Ht 66.25 in | Wt 226.8 lb

## 2016-03-20 DIAGNOSIS — J020 Streptococcal pharyngitis: Secondary | ICD-10-CM

## 2016-03-20 DIAGNOSIS — R21 Rash and other nonspecific skin eruption: Secondary | ICD-10-CM | POA: Diagnosis not present

## 2016-03-20 NOTE — Assessment & Plan Note (Signed)
Positive rapid strep test at urgent care. Symptoms improving on amoxicillin. She will continue to monitor. Given return precautions.

## 2016-03-20 NOTE — Progress Notes (Signed)
Patient ID: Stephanie Shields, female   DOB: 1971-02-07, 45 y.o.   MRN: [id number]  [name], MD Phone: [phone]  Stephanie Shields is a 45 y.o. female who presents today for same-day visit.  Patient notes she started with sore throat last week and then developed tiredness and feeling sick on Monday. She was evaluated at the walk-in clinic and started on doxycycline for a possible bug bite over her anterior neck they felt could be MRSA. She had a negative strep test at that time. She followed up yesterday at an urgent care and was started on amoxicillin for a positive strep throat test. She notes her sore throat has improved. She feels back to normal. She does note the small area of rash on her anterior neck is still present. She has tried hydrocortisone cream on it. She's not had any fevers.  PMH: nonsmoker.   ROS see history of present illness  Objective  Physical Exam Filed Vitals:   03/20/16 1135  BP: 118/72  Pulse: 70  Temp: 97.9 F (36.6 C)    BP Readings from Last 3 Encounters:  03/20/16 118/72  02/19/16 102/78  12/21/15 118/66   Wt Readings from Last 3 Encounters:  03/20/16 226 lb 12.8 oz (102.876 kg)  02/19/16 227 lb 4 oz (103.08 kg)  12/21/15 233 lb (105.688 kg)    Physical Exam  Constitutional: She is well-developed, well-nourished, and in no distress.  HENT:  Head: Normocephalic and atraumatic.  Right Ear: External ear normal.  Left Ear: External ear normal.  Mouth/Throat: Oropharynx is clear and moist. No oropharyngeal exudate.  TMs mildly erythematous with mild decrease light reflex, no purulent material behind the TMs  Eyes: Conjunctivae are normal. Pupils are equal, round, and reactive to light.  Neck: Neck supple.  Small less than dime-sized area of erythema midline neck right about the middle of her neck that is nontender, nonfluctuant, nonindurated  Cardiovascular: Normal rate, regular rhythm and normal heart sounds.   Pulmonary/Chest: Effort  normal and breath sounds normal.  Lymphadenopathy:    She has no cervical adenopathy.  Neurological: She is alert. Gait normal.  Skin: Skin is warm and dry. She is not diaphoretic.     Assessment/Plan: Please see individual problem list.  Streptococcal sore throat Positive rapid strep test at urgent care. Symptoms improving on amoxicillin. She will continue to monitor. Given return precautions.  Rash and nonspecific skin eruption Patient with nonspecific rash in the anterior portion of her neck. Could be related to a clogged follicle though has no purulent material. Doubt MRSA given lack of purulent material. Doubt cellulitis given lack of warmth and induration. Could be a bug bite given itching that she has had. She will finish her course of doxycycline. She will continue to monitor and try Benadryl cream on it. She is given return precautions.    [name], MD [place]

## 2016-03-20 NOTE — Progress Notes (Signed)
Pre visit review using our clinic review tool, if applicable. No additional management support is needed unless otherwise documented below in the visit note. 

## 2016-03-20 NOTE — Assessment & Plan Note (Signed)
Patient with nonspecific rash in the anterior portion of her neck. Could be related to a clogged follicle though has no purulent material. Doubt MRSA given lack of purulent material. Doubt cellulitis given lack of warmth and induration. Could be a bug bite given itching that she has had. She will finish her course of doxycycline. She will continue to monitor and try Benadryl cream on it. She is given return precautions.

## 2016-03-20 NOTE — Patient Instructions (Signed)
Nice to see you. Please complete your treatments of amoxicillin and doxycycline for strep throat and lesion on your skin. If you develop fevers, sore throat, trouble swallowing, spreading rash, or any new or changing symptoms please seek medical attention.

## 2016-04-25 ENCOUNTER — Telehealth: Payer: Self-pay | Admitting: *Deleted

## 2016-04-25 NOTE — Telephone Encounter (Signed)
Patient called back to schedule her Hep AB vaccine on 04/29/16

## 2016-04-25 NOTE — Telephone Encounter (Signed)
Patient will be traveling to [place] and [place] on June 28, she requested to know if her regular vaccines are up to date. She stated that Hep A and B, rabies and [place] encelitis vaccine are recommenced. Can she receive these vaccines in this office.

## 2016-04-25 NOTE — Telephone Encounter (Signed)
Spoke with patient and let her know we do not have any immunizations on file for her.  We do not offer the rabies or encelitis vaccine, but we do offer the Hep A/B vaccine.  Recommended she check with previous doctor's seen to verify immunization record and present [place] with a copy.  Also, to check with her local health department for the vaccines requested.  Encouraged to call the office back to make an appointment if she still needs the Hep A/B vaccine after following up with prior physicians record.

## 2016-04-27 DIAGNOSIS — J019 Acute sinusitis, unspecified: Secondary | ICD-10-CM | POA: Diagnosis not present

## 2016-04-29 ENCOUNTER — Other Ambulatory Visit: Payer: Self-pay | Admitting: Family Medicine

## 2016-04-29 ENCOUNTER — Ambulatory Visit: Payer: BLUE CROSS/BLUE SHIELD

## 2016-04-29 ENCOUNTER — Other Ambulatory Visit (INDEPENDENT_AMBULATORY_CARE_PROVIDER_SITE_OTHER): Payer: BLUE CROSS/BLUE SHIELD

## 2016-04-29 DIAGNOSIS — Z283 Underimmunization status: Secondary | ICD-10-CM

## 2016-04-29 DIAGNOSIS — Z2839 Other underimmunization status: Secondary | ICD-10-CM

## 2016-04-29 DIAGNOSIS — Z789 Other specified health status: Secondary | ICD-10-CM

## 2016-04-30 LAB — HEPATITIS A ANTIBODY, TOTAL: HEP A TOTAL AB: REACTIVE — AB

## 2016-04-30 LAB — HEPATITIS B SURFACE ANTIGEN: HEP B S AG: NEGATIVE

## 2016-04-30 LAB — HEPATITIS B CORE ANTIBODY, TOTAL: HEP B C TOTAL AB: NONREACTIVE

## 2016-04-30 LAB — HEPATITIS B SURFACE ANTIBODY,QUALITATIVE: HEP B S AB: NEGATIVE

## 2016-05-01 ENCOUNTER — Other Ambulatory Visit: Payer: Self-pay | Admitting: *Deleted

## 2016-05-01 ENCOUNTER — Telehealth: Payer: Self-pay | Admitting: *Deleted

## 2016-05-01 MED ORDER — DEXLANSOPRAZOLE 60 MG PO CPDR: 60.0000 mg | DELAYED_RELEASE_CAPSULE | Freq: Every day | ORAL | Status: DC

## 2016-05-01 NOTE — Telephone Encounter (Signed)
Patient has requested lab results She will also need a medication refill for dexilant  Pharmacy [facility] [address]

## 2016-05-01 NOTE — Telephone Encounter (Signed)
Sent in [place]. Patient already informed of lab results

## 2016-05-02 ENCOUNTER — Ambulatory Visit (INDEPENDENT_AMBULATORY_CARE_PROVIDER_SITE_OTHER): Payer: BLUE CROSS/BLUE SHIELD | Admitting: Internal Medicine

## 2016-05-02 DIAGNOSIS — Z9189 Other specified personal risk factors, not elsewhere classified: Secondary | ICD-10-CM

## 2016-05-02 MED ORDER — CIPROFLOXACIN HCL 500 MG PO TABS: 500.0000 mg | ORAL_TABLET | Freq: Two times a day (BID) | ORAL | Status: DC

## 2016-05-06 ENCOUNTER — Ambulatory Visit (INDEPENDENT_AMBULATORY_CARE_PROVIDER_SITE_OTHER): Payer: BLUE CROSS/BLUE SHIELD | Admitting: *Deleted

## 2016-05-06 ENCOUNTER — Ambulatory Visit (INDEPENDENT_AMBULATORY_CARE_PROVIDER_SITE_OTHER): Payer: BLUE CROSS/BLUE SHIELD | Admitting: Family Medicine

## 2016-05-06 VITALS — BP 108/72 | HR 70 | Temp 98.6°F

## 2016-05-06 DIAGNOSIS — J069 Acute upper respiratory infection, unspecified: Secondary | ICD-10-CM

## 2016-05-06 DIAGNOSIS — Z23 Encounter for immunization: Secondary | ICD-10-CM | POA: Diagnosis not present

## 2016-05-06 LAB — POCT RAPID STREP A (OFFICE): Rapid Strep A Screen: NEGATIVE

## 2016-05-06 NOTE — Progress Notes (Signed)
Pre visit review using our clinic review tool, if applicable. No additional management support is needed unless otherwise documented below in the visit note. 

## 2016-05-06 NOTE — Assessment & Plan Note (Signed)
New acute problem. Has already been on antibiotics. Rapid strep negative. Advised supportive care, flonase, PRN Sudafed.

## 2016-05-06 NOTE — Progress Notes (Signed)
   Subjective:  Patient ID: Stephanie Shields, female    DOB: 1971/09/20  Age: 45 y.o. MRN: [id number]  CC: Sinus congestion, ST  HPI:  45 year old female presents with the above complaints.  Patient states that she's been sick for the past 2 weeks. Symptoms started after her child became sick with adenovirus. She was recently seen at urgent care and was treated with amoxicillin for a sinus infection. She reports that she continues to have sinus congestion but it is improved. Last night she developed sore throat. Moderate in severity. No fever or chills. No known exacerbating factors. No other complaints at this time.  Social Hx   Social History   Social History  . Marital Status: Married    Spouse Name: N/A  . Number of Children: N/A  . Years of Education: N/A   Social History Main Topics  . Smoking status: Never Smoker   . Smokeless tobacco: Not on file  . Alcohol Use: 0.0 oz/week    0 Standard drinks or equivalent per week  . Drug Use: No  . Sexual Activity: Not on file   Other Topics Concern  . Not on file   Social History Narrative   Review of Systems  Constitutional: Negative for fever.  HENT: Positive for congestion and sore throat.    Objective:  BP 108/72 mmHg  Pulse 70  Temp(Src) 98.6 F (37 C) (Oral)  SpO2 98%  BP/Weight 05/06/2016 03/20/2016 [id number]  Systolic BP [id number] 123456 [id number]  Diastolic BP 72 72 78  Wt. (Lbs) - 226.8 227.25  BMI - 36.32 36.39   Physical Exam  Constitutional: She is oriented to person, place, and time. She appears well-developed. No distress.  HENT:  Oropharynx with erythema. No exudate. + Post nasal drip.  Normal TM's.  Cardiovascular: Normal rate and regular rhythm.   Pulmonary/Chest: Effort normal. She has no wheezes. She has no rales.  Neurological: She is alert and oriented to person, place, and time.  Vitals reviewed.  Lab Results  Component Value Date   WBC 15.8* 04/01/2013   HGB 11.3* 04/01/2013   HCT 31.9* 04/01/2013   PLT  238 04/01/2013   GLUCOSE 92 01/19/2012   ALT 29 01/19/2012   AST 21 01/19/2012   NA 137 01/19/2012   K 4.3 01/19/2012   CL 105 01/19/2012   CREATININE 0.75 01/19/2012   BUN 15 01/19/2012   CO2 28 01/19/2012   TSH 0.54 11/19/2015   Assessment & Plan:   Problem List Items Addressed This Visit    URI (upper respiratory infection) - Primary    New acute problem. Has already been on antibiotics. Rapid strep negative. Advised supportive care, flonase, PRN Sudafed.       Relevant Orders   POCT rapid strep A (Completed)     Follow-up: PRN  [place]

## 2016-05-06 NOTE — Progress Notes (Signed)
Patient received first injection of HEP B vaccine to left deltoid.  Patient given reminder card for second dose.

## 2016-05-06 NOTE — Patient Instructions (Signed)
Use flonase and a nettipot for the congestion.  Ibuprofen for sore throat.  This is likely viral and will improve.  Take care  Dr. Lacinda Axon

## 2016-05-16 DIAGNOSIS — J069 Acute upper respiratory infection, unspecified: Secondary | ICD-10-CM | POA: Diagnosis not present

## 2016-05-30 NOTE — Progress Notes (Signed)
  RFV: pretravel counseling for upcoming trip to [place] Subjective:    Patient ID: Lenoria Farrier, female    DOB: [birth year], 45 y.o.   MRN: [id number]  HPI  Elray Mcgregor is a 45yo F who will be going to a work related conference to [place] and briefly in [place].  Allergies  Allergen Reactions  . Sulfa Antibiotics Nausea And Vomiting   Current Outpatient Prescriptions on File Prior to Visit  Medication Sig Dispense Refill  . Azelastine-Fluticasone 137-50 MCG/ACT SUSP 1 spray in each nostril twice daily. 23 g 1  . dexlansoprazole (DEXILANT) 60 MG capsule Take 1 capsule (60 mg total) by mouth daily. 90 capsule 0  . levothyroxine (SYNTHROID, LEVOTHROID) 150 MCG tablet Take by mouth.    . Vitamin D, Ergocalciferol, (DRISDOL) 50000 UNITS CAPS capsule      No current facility-administered medications on file prior to visit.   Active Ambulatory Problems    Diagnosis Date Noted  . Hypothyroidism 11/20/2015  . GERD (gastroesophageal reflux disease) 11/20/2015  . URI (upper respiratory infection) 05/06/2016   Resolved Ambulatory Problems    Diagnosis Date Noted  . Hand aching 12/22/2015  . Sinus pressure 02/20/2016  . Streptococcal sore throat 03/20/2016  . Rash and nonspecific skin eruption 03/20/2016   Past Medical History  Diagnosis Date  . Allergic rhinitis   . Elevated cholesterol   . History of blood transfusion   . Urinary incontinence   . 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 Recommended hep A. 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  Can either use azithromycin or cipro for treatment if needed.  Can use mosquito repellent for bug bite prevention but no need for malaria prophylaxis

## 2016-06-10 ENCOUNTER — Ambulatory Visit (INDEPENDENT_AMBULATORY_CARE_PROVIDER_SITE_OTHER): Payer: BLUE CROSS/BLUE SHIELD

## 2016-06-10 DIAGNOSIS — Z23 Encounter for immunization: Secondary | ICD-10-CM

## 2016-06-10 NOTE — Progress Notes (Signed)
Patient is in today receiving a Hep B injection in right deltoid. patient tolerated well.

## 2016-06-12 ENCOUNTER — Ambulatory Visit: Payer: BLUE CROSS/BLUE SHIELD | Admitting: Family Medicine

## 2016-06-19 ENCOUNTER — Encounter: Payer: Self-pay | Admitting: Family Medicine

## 2016-06-19 ENCOUNTER — Ambulatory Visit (INDEPENDENT_AMBULATORY_CARE_PROVIDER_SITE_OTHER): Payer: BLUE CROSS/BLUE SHIELD | Admitting: Family Medicine

## 2016-06-19 VITALS — BP 108/66 | HR 88 | Temp 98.0°F | Ht 66.25 in | Wt 227.4 lb

## 2016-06-19 DIAGNOSIS — K219 Gastro-esophageal reflux disease without esophagitis: Secondary | ICD-10-CM

## 2016-06-19 DIAGNOSIS — E039 Hypothyroidism, unspecified: Secondary | ICD-10-CM | POA: Diagnosis not present

## 2016-06-19 DIAGNOSIS — E669 Obesity, unspecified: Secondary | ICD-10-CM

## 2016-06-19 DIAGNOSIS — J309 Allergic rhinitis, unspecified: Secondary | ICD-10-CM | POA: Diagnosis not present

## 2016-06-19 LAB — TSH: TSH: 1.91 u[IU]/mL (ref 0.35–4.50)

## 2016-06-19 NOTE — Assessment & Plan Note (Signed)
BMI 36. Discussed diet and exercise. Encouraged her to work on this as she is able to.

## 2016-06-19 NOTE — Assessment & Plan Note (Addendum)
Asymptomatic. Check TSH today. Continue Synthroid.

## 2016-06-19 NOTE — Progress Notes (Signed)
  [name], MD Phone: [phone]  Stephanie Shields is a 45 y.o. female who presents today for f/u.  HYPOTHYROIDISM Disease Monitoring Weight changes: none  Skin Changes: no Palpitations: no Heat/Cold intolerance: no  Medication Monitoring Compliance:  Taking synthroid   Last TSH:   Lab Results  Component Value Date   TSH 0.54 11/19/2015   GERD: Well controlled. Diffuse symptoms on her PPI. No abdominal pain or blood in her stool.  Patient notes she had been dealing with upper respiratory symptoms for about a month and a half until the last week or so. Had been having sinus pain and cough with postnasal drip and sore throat. She went through 2 courses of antibiotics and this has resolved. Had some runny nose and postnasal drip over the last week and has been using Flonase and Claritin and this has improved.  Obesity: Exercises with her kids. Walks a mile 5 times a week with her family. Does meal prep deliveries. Tries to eat healthy. Just does not have the time to increase her effort with this.  PMH: nonsmoker   ROS see history of present illness  Objective  Physical Exam Filed Vitals:   06/19/16 1405  BP: 108/66  Pulse: 88  Temp: 98 F (36.7 C)    BP Readings from Last 3 Encounters:  06/19/16 108/66  05/06/16 108/72  03/20/16 118/72   Wt Readings from Last 3 Encounters:  06/19/16 227 lb 6.4 oz (103.148 kg)  03/20/16 226 lb 12.8 oz (102.876 kg)  02/19/16 227 lb 4 oz (103.08 kg)    Physical Exam  Constitutional: She is well-developed, well-nourished, and in no distress.  HENT:  Head: Normocephalic and atraumatic.  Right Ear: External ear normal.  Left Ear: External ear normal.  Mouth/Throat: Oropharynx is clear and moist. No oropharyngeal exudate.  Bilateral TMs minimally erythematous, no fluid noted behind the TMs  Eyes: Conjunctivae are normal. Pupils are equal, round, and reactive to light.  Cardiovascular: Normal rate, regular rhythm and normal heart  sounds.   Pulmonary/Chest: Effort normal and breath sounds normal.  Neurological: She is alert. Gait normal.  Skin: Skin is warm and dry.     Assessment/Plan: Please see individual problem list.  GERD (gastroesophageal reflux disease) Well-controlled. Continue current PPI therapy.  Hypothyroidism Asymptomatic. Check TSH today. Continue Synthroid.  Allergic rhinitis Upper respiratory symptoms recently likely related to allergic rhinitis. She'll continue Flonase and Claritin. Monitor for recurrence.  Obesity BMI 36. Discussed diet and exercise. Encouraged her to work on this as she is able to.    Orders Placed This Encounter  Procedures  . TSH    [name], MD [facility]

## 2016-06-19 NOTE — Assessment & Plan Note (Signed)
Upper respiratory symptoms recently likely related to allergic rhinitis. She'll continue Flonase and Claritin. Monitor for recurrence.

## 2016-06-19 NOTE — Assessment & Plan Note (Signed)
Well-controlled. Continue current PPI therapy.

## 2016-06-19 NOTE — Progress Notes (Signed)
Pre visit review using our clinic review tool, if applicable. No additional management support is needed unless otherwise documented below in the visit note. 

## 2016-06-19 NOTE — Patient Instructions (Signed)
Nice to see you. We're going to check your thyroid. Please continue Flonase and Claritin. Please continue your reflux medication. If you develop worsening reflux, any upper rest for his symptoms, fevers, or any new or changing symptoms please seek medical attention.

## 2016-07-28 ENCOUNTER — Other Ambulatory Visit: Payer: Self-pay | Admitting: Family Medicine

## 2016-08-06 ENCOUNTER — Telehealth: Payer: Self-pay | Admitting: *Deleted

## 2016-08-06 MED ORDER — LEVOTHYROXINE SODIUM 150 MCG PO TABS: 150.0000 ug | ORAL_TABLET | Freq: Every day | ORAL | 3 refills | Status: DC

## 2016-08-06 NOTE — Telephone Encounter (Signed)
Medication refilled

## 2016-08-06 NOTE — Telephone Encounter (Signed)
Pt requested her levothyroxine refilled [facility]

## 2016-09-16 DIAGNOSIS — J019 Acute sinusitis, unspecified: Secondary | ICD-10-CM | POA: Diagnosis not present

## 2016-09-24 ENCOUNTER — Ambulatory Visit (INDEPENDENT_AMBULATORY_CARE_PROVIDER_SITE_OTHER): Payer: BLUE CROSS/BLUE SHIELD | Admitting: Podiatry

## 2016-09-24 ENCOUNTER — Encounter: Payer: Self-pay | Admitting: Podiatry

## 2016-09-24 ENCOUNTER — Ambulatory Visit (INDEPENDENT_AMBULATORY_CARE_PROVIDER_SITE_OTHER): Payer: BLUE CROSS/BLUE SHIELD

## 2016-09-24 VITALS — Ht 66.0 in | Wt 226.0 lb

## 2016-09-24 DIAGNOSIS — M79671 Pain in right foot: Secondary | ICD-10-CM

## 2016-09-24 DIAGNOSIS — M722 Plantar fascial fibromatosis: Secondary | ICD-10-CM

## 2016-09-24 DIAGNOSIS — M84374A Stress fracture, right foot, initial encounter for fracture: Secondary | ICD-10-CM | POA: Diagnosis not present

## 2016-09-24 DIAGNOSIS — M7751 Other enthesopathy of right foot: Secondary | ICD-10-CM | POA: Diagnosis not present

## 2016-09-24 DIAGNOSIS — M778 Other enthesopathies, not elsewhere classified: Secondary | ICD-10-CM

## 2016-09-24 DIAGNOSIS — M779 Enthesopathy, unspecified: Secondary | ICD-10-CM

## 2016-09-24 MED ORDER — IBUPROFEN-FAMOTIDINE 800-26.6 MG PO TABS: 1.0000 | ORAL_TABLET | Freq: Three times a day (TID) | ORAL | 1 refills | Status: DC

## 2016-09-24 MED ORDER — NONFORMULARY OR COMPOUNDED ITEM: 1.0000 g | Freq: Four times a day (QID) | 2 refills | Status: DC

## 2016-09-24 NOTE — Progress Notes (Signed)
   Subjective:    Patient ID: Stephanie Shields, female    DOB: 1971-03-30, 45 y.o.   MRN: [id number]  HPI    Review of Systems  Allergic/Immunologic: Positive for environmental allergies.  All other systems reviewed and are negative.      Objective:   Physical Exam        Assessment & Plan:

## 2016-09-25 MED ORDER — BETAMETHASONE SOD PHOS & ACET 6 (3-3) MG/ML IJ SUSP: 3.0000 mg | Freq: Once | INTRAMUSCULAR | Status: DC

## 2016-09-25 NOTE — Progress Notes (Signed)
Patient ID: Stephanie Shields, female   DOB: [birth year], 45 y.o.   MRN: [id number] Subjective: Patient presents today for pain and tenderness in the right foot. Patient states the foot pain has been hurting for several weeks now. Patient states that it hurts in the mornings with the first steps out of bed. Patient presents today for further treatment and evaluation Patient also complains of pain to the lateral aspect of the patient's right foot. Patient states that she noticed the pain before she began to notice her heel pain.  Objective: Physical Exam General: The patient is alert and oriented x3 in no acute distress.  Dermatology: Skin is warm, dry and supple bilateral lower extremities. Negative for open lesions or macerations bilateral.   Vascular: Dorsalis Pedis and Posterior Tibial pulses palpable bilateral.  Capillary fill time is immediate to all digits.  Neurological: Epicritic and protective threshold intact bilateral.   Musculoskeletal: Tenderness to palpation at the medial calcaneal tubercale and through the insertion of the plantar fascia of the right foot. All other joints range of motion within normal limits bilateral. Strength 5/5 in all groups bilateral.  Pain on palpation noted to the lateral aspect of the patient's right foot  Radiographic exam: Normal osseous mineralization. Joint spaces preserved. No fracture/dislocation/boney destruction. Calcaneal spur present with mild thickening of plantar fascia right. No other soft tissue abnormalities or radiopaque foreign bodies.  Negative for fracture dislocation to the lateral aspect of the right foot  Assessment: 1. Plantar fasciitis right 2. Pain in right foot 3. Possible stress fracture not visible on x-ray fourth metatarsal right foot 4. Capsulitis right midfoot-Lisfranc joint   Plan of Care:  1. Patient evaluated. Xrays reviewed.   2. Injection of 0.5cc Celestone soluspan injected into the right heel at the insertion of  the plantar fascia.  3. Instructed patient regarding therapies and modalities at home to alleviate symptoms.  4. Rx for duexis dispensed   5. Rx for anti-inflammatory pain cream dispensed through [employer]. 6. Plantar fascial band(s) dispensed. 7. Injection of 0.5 mL Celestone Soluspan injected in the patient's right Lisfranc joint laterally.  8. Return to clinic in 4 weeks.

## 2016-09-26 ENCOUNTER — Telehealth: Payer: Self-pay | Admitting: *Deleted

## 2016-09-26 NOTE — Telephone Encounter (Addendum)
Pt states she was given Duexis and she takes Dexilant for stomach problems and she wandered if the Famotidine would cause a problem with the [place]. I told pt I would check with Dr. Amalia Hailey incase it did and call again possible Monday. Pt states that would be fine, the shot and brace has helped and she has Ibuprofen at home. 09/30/2016-Left message with Dr. Amalia Hailey explanation no reactions between Famotidine and Dexilant, and that she should be okay to take as long as she tolerated Ibuprofen.

## 2016-09-26 NOTE — Telephone Encounter (Signed)
There's no significant reactions between famotidine and dexilant. She is okay to take it if she is known to tolerate ibuprofen.   Thanks,  Dr. Amalia Hailey

## 2016-09-29 DIAGNOSIS — Z01419 Encounter for gynecological examination (general) (routine) without abnormal findings: Secondary | ICD-10-CM | POA: Diagnosis not present

## 2016-09-29 DIAGNOSIS — R32 Unspecified urinary incontinence: Secondary | ICD-10-CM | POA: Diagnosis not present

## 2016-09-29 DIAGNOSIS — Z1231 Encounter for screening mammogram for malignant neoplasm of breast: Secondary | ICD-10-CM | POA: Diagnosis not present

## 2016-10-15 DIAGNOSIS — Z1231 Encounter for screening mammogram for malignant neoplasm of breast: Secondary | ICD-10-CM | POA: Diagnosis not present

## 2016-10-20 ENCOUNTER — Ambulatory Visit (INDEPENDENT_AMBULATORY_CARE_PROVIDER_SITE_OTHER): Payer: BLUE CROSS/BLUE SHIELD | Admitting: Podiatry

## 2016-10-20 DIAGNOSIS — M722 Plantar fascial fibromatosis: Secondary | ICD-10-CM

## 2016-10-20 DIAGNOSIS — M779 Enthesopathy, unspecified: Secondary | ICD-10-CM

## 2016-10-20 DIAGNOSIS — M79671 Pain in right foot: Secondary | ICD-10-CM

## 2016-10-20 DIAGNOSIS — M778 Other enthesopathies, not elsewhere classified: Secondary | ICD-10-CM

## 2016-10-20 DIAGNOSIS — M7751 Other enthesopathy of right foot: Secondary | ICD-10-CM

## 2016-10-22 DIAGNOSIS — N3641 Hypermobility of urethra: Secondary | ICD-10-CM | POA: Diagnosis not present

## 2016-10-22 DIAGNOSIS — N393 Stress incontinence (female) (male): Secondary | ICD-10-CM | POA: Diagnosis not present

## 2016-11-09 NOTE — Progress Notes (Signed)
Patient ID: Stephanie Shields, female   DOB: [birth year], 45 y.o.   MRN: [id number] Subjective: Patient presents today for follow-up evaluation of pain and tenderness in the right foot. Patient states that there is a lot of improvement and she feels much better. Patient presents today for further treatment and evaluation  Objective: Physical Exam General: The patient is alert and oriented x3 in no acute distress.  Dermatology: Skin is warm, dry and supple bilateral lower extremities. Negative for open lesions or macerations bilateral.   Vascular: Dorsalis Pedis and Posterior Tibial pulses palpable bilateral.  Capillary fill time is immediate to all digits.  Neurological: Epicritic and protective threshold intact bilateral.   Musculoskeletal: Tenderness to palpation at the medial calcaneal tubercale and through the insertion of the plantar fascia of the right foot. All other joints range of motion within normal limits bilateral. Strength 5/5 in all groups bilateral.  Pain on palpation noted to the lateral aspect of the patient's right foot  Assessment: 1. Plantar fasciitis right-improved 2. Pain in right foot-improved 3. Possible stress fracture not visible on x-ray fourth metatarsal right foot 4. Capsulitis right midfoot-Lisfranc joint   Plan of Care:  1. Patient evaluated. Xrays reviewed.   2. Today we discussed the conservative management of plantar fasciitis to the right foot including shoe gear modification, supportive tennis shoes, physical therapy activities, and oral anti-inflammatories. Instructed the patient regarding therapies and modalities at home to alleviate symptoms. 3. Today patient was scanned for custom molded orthotics. 4. Patient was concerned regarding the mornings labeled on her Duexis prescription. Recommend taking Duexis as needed for inflammation 5. Return to clinic in 3 weeks for orthotic pickup

## 2016-11-10 ENCOUNTER — Ambulatory Visit (INDEPENDENT_AMBULATORY_CARE_PROVIDER_SITE_OTHER): Payer: BLUE CROSS/BLUE SHIELD | Admitting: Podiatry

## 2016-11-10 DIAGNOSIS — M7751 Other enthesopathy of right foot: Secondary | ICD-10-CM

## 2016-11-10 DIAGNOSIS — M79671 Pain in right foot: Secondary | ICD-10-CM | POA: Diagnosis not present

## 2016-11-10 DIAGNOSIS — M778 Other enthesopathies, not elsewhere classified: Secondary | ICD-10-CM

## 2016-11-10 DIAGNOSIS — M722 Plantar fascial fibromatosis: Secondary | ICD-10-CM | POA: Diagnosis not present

## 2016-11-10 DIAGNOSIS — M779 Enthesopathy, unspecified: Secondary | ICD-10-CM

## 2016-11-10 DIAGNOSIS — M84374D Stress fracture, right foot, subsequent encounter for fracture with routine healing: Secondary | ICD-10-CM

## 2016-11-10 NOTE — Progress Notes (Signed)
Patient ID: Stephanie Shields, female   DOB: 06/24/1971, 45 y.o.   MRN: [id number]  Patient presents for orthotic pick up.  Verbal and written break in and wear instructions given.  Patient will follow up in 4 weeks if symptoms worsen or fail to improve.

## 2016-11-10 NOTE — Patient Instructions (Signed)

## 2016-11-12 ENCOUNTER — Ambulatory Visit (INDEPENDENT_AMBULATORY_CARE_PROVIDER_SITE_OTHER): Payer: BLUE CROSS/BLUE SHIELD

## 2016-11-12 ENCOUNTER — Ambulatory Visit: Payer: BLUE CROSS/BLUE SHIELD

## 2016-11-12 DIAGNOSIS — Z23 Encounter for immunization: Secondary | ICD-10-CM | POA: Diagnosis not present

## 2016-11-12 NOTE — Progress Notes (Signed)
Patient comes in for injection.  Injected right deltoid.  Patient tolerated injection well.

## 2016-11-16 MED ORDER — BETAMETHASONE SOD PHOS & ACET 6 (3-3) MG/ML IJ SUSP: 3.0000 mg | Freq: Once | INTRAMUSCULAR | Status: DC

## 2016-11-16 NOTE — Progress Notes (Signed)
I reviewed the above note and agree.  Scottlynn Lindell, M.D. 

## 2016-11-16 NOTE — Progress Notes (Signed)
Patient ID: Stephanie Shields, female   DOB: 06-Feb-1971, 45 y.o.   MRN: [id number] Subjective: Patient presents today for follow-up evaluation of pain and tenderness in the right foot. Patient states that there is a lot of improvement and she feels much better. Patient also presents today for orthotic pickup Patient presents today for further treatment and evaluation  Objective: Physical Exam General: The patient is alert and oriented x3 in no acute distress.  Dermatology: Skin is warm, dry and supple bilateral lower extremities. Negative for open lesions or macerations bilateral.   Vascular: Dorsalis Pedis and Posterior Tibial pulses palpable bilateral.  Capillary fill time is immediate to all digits.  Neurological: Epicritic and protective threshold intact bilateral.   Musculoskeletal: Tenderness to palpation at the medial calcaneal tubercale and through the insertion of the plantar fascia of the right foot. All other joints range of motion within normal limits bilateral. Strength 5/5 in all groups bilateral.  Pain on palpation noted to the lateral aspect of the patient's right foot  Assessment: 1. Plantar fasciitis right-improved 2. Pain in right foot-improved 3. Possible stress fracture not visible on x-ray fourth metatarsal right foot 4. Capsulitis right midfoot-Lisfranc joint   Plan of Care:  1. Patient evaluated. Xrays reviewed.   2. Today we discussed the conservative management of plantar fasciitis to the right foot including shoe gear modification, supportive tennis shoes, physical therapy activities, and oral anti-inflammatories. Instructed the patient regarding therapies and modalities at home to alleviate symptoms. 3. Today the patient picked up her custom molded orthotics with break-in instructions provided 4. Injection of 0.5 mL Celestone Soluspan injected into the right heel the insertion of the plantar fascia 5. Injection of 0.5 mL Celestone Soluspan injected into the right  midfoot-Lisfranc joint 6. Return to clinic in 4 weeks   Patient ID: Stephanie Shields, female   DOB: 30-Oct-1971, 45 y.o.   MRN: [id number]

## 2016-11-18 ENCOUNTER — Other Ambulatory Visit: Payer: Self-pay | Admitting: Family Medicine

## 2016-11-21 DIAGNOSIS — N393 Stress incontinence (female) (male): Secondary | ICD-10-CM | POA: Diagnosis not present

## 2016-11-21 DIAGNOSIS — M6281 Muscle weakness (generalized): Secondary | ICD-10-CM | POA: Diagnosis not present

## 2016-11-21 DIAGNOSIS — M62838 Other muscle spasm: Secondary | ICD-10-CM | POA: Diagnosis not present

## 2016-11-27 ENCOUNTER — Encounter: Payer: Self-pay | Admitting: Family

## 2016-11-27 ENCOUNTER — Ambulatory Visit (INDEPENDENT_AMBULATORY_CARE_PROVIDER_SITE_OTHER): Payer: BLUE CROSS/BLUE SHIELD | Admitting: Family

## 2016-11-27 VITALS — BP 118/76 | HR 73 | Temp 98.3°F | Ht 66.0 in | Wt 228.4 lb

## 2016-11-27 DIAGNOSIS — H6592 Unspecified nonsuppurative otitis media, left ear: Secondary | ICD-10-CM | POA: Diagnosis not present

## 2016-11-27 MED ORDER — PROMETHAZINE-DM 6.25-15 MG/5ML PO SYRP: 5.0000 mL | ORAL_SOLUTION | Freq: Every evening | ORAL | 1 refills | Status: DC | PRN

## 2016-11-27 MED ORDER — AMOXICILLIN 500 MG PO CAPS: 500.0000 mg | ORAL_CAPSULE | Freq: Two times a day (BID) | ORAL | 0 refills | Status: DC

## 2016-11-27 NOTE — Progress Notes (Signed)
Pre visit review using our clinic review tool, if applicable. No additional management support is needed unless otherwise documented below in the visit note. 

## 2016-11-27 NOTE — Patient Instructions (Signed)
Let me know if not better  Otitis Media With Effusion Otitis media with effusion is the presence of fluid in the middle ear. This is a common problem in children, which often follows ear infections. It may be present for weeks or longer after the infection. Unlike an acute ear infection, otitis media with effusion refers only to fluid behind the ear drum and not infection. Children with repeated ear and sinus infections and allergy problems are the most likely to get otitis media with effusion. CAUSES  The most frequent cause of the fluid buildup is dysfunction of the eustachian tubes. These are the tubes that drain fluid in the ears to the back of the nose (nasopharynx). SYMPTOMS   The main symptom of this condition is hearing loss. As a result, you or your child may:  Listen to the TV at a loud volume.  Not respond to questions.  Ask "what" often when spoken to.  Mistake or confuse one sound or word for another.  There may be a sensation of fullness or pressure but usually not pain. DIAGNOSIS   Your health care provider will diagnose this condition by examining you or your child's ears.  Your health care provider may test the pressure in you or your child's ear with a tympanometer.  A hearing test may be conducted if the problem persists. TREATMENT   Treatment depends on the duration and the effects of the effusion.  Antibiotics, decongestants, nose drops, and cortisone-type drugs (tablets or nasal spray) may not be helpful.  Children with persistent ear effusions may have delayed language or behavioral problems. Children at risk for developmental delays in hearing, learning, and speech may require referral to a specialist earlier than children not at risk.  You or your child's health care provider may suggest a referral to an ear, nose, and throat surgeon for treatment. The following may help restore normal hearing:  Drainage of fluid.  Placement of ear tubes (tympanostomy  tubes).  Removal of adenoids (adenoidectomy). HOME CARE INSTRUCTIONS   Avoid secondhand smoke.  Infants who are breastfed are less likely to have this condition.  Avoid feeding infants while they are lying flat.  Avoid known environmental allergens.  Avoid people who are sick. SEEK MEDICAL CARE IF:   Hearing is not better in 3 months.  Hearing is worse.  Ear pain.  Drainage from the ear.  Dizziness. MAKE SURE YOU:   Understand these instructions.  Will watch your condition.  Will get help right away if you are not doing well or get worse. This information is not intended to replace advice given to you by your health care provider. Make sure you discuss any questions you have with your health care provider. Document Released: 12/25/2004 Document Revised: 12/08/2014 Document Reviewed: 06/14/2013 Elsevier Interactive Patient Education  2017 [employer].

## 2016-11-27 NOTE — Progress Notes (Signed)
Subjective:    Patient ID: Stephanie Shields, female    DOB: 01/16/1971, 45 y.o.   MRN: [id number]  CC: Stephanie Shields is a 45 y.o. female who presents today for an acute visit.    HPI: CC: thick sinus congestion x 2 weeks, worsening. Tried claritin D,  sudafed without relief. Endorses ear pain, sinus pressure, productive cough. No HA, vision changes, fever, chills.  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        HISTORY:  Past Medical History:  Diagnosis Date  . Allergic rhinitis   . 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 Elevated cholesterol   . GERD (gastroesophageal reflux disease)   . History of blood transfusion   . Hypothyroidism   . Urinary incontinence    After delivery of her recent baby   Past Surgical History:  Procedure Laterality Date  . APPENDECTOMY  1980   Family History  Problem Relation Age of Onset  . Arthritis      Parent, Grandparent  . Prostate cancer      Grandparent  . Hyperlipidemia      Parent  . Heart disease      Grandparent  . Stroke      Grandparent  . Diabetes      Parent    Allergies: Sulfa antibiotics Current Outpatient Prescriptions on File Prior to Visit  Medication Sig Dispense Refill  . Azelastine-Fluticasone 137-50 MCG/ACT SUSP 1 spray in each nostril twice daily. 23 g 1  . DEXILANT 60 MG capsule TAKE 1 CAPSULE (60 MG TOTAL) BY MOUTH DAILY. 90 capsule 2  . Ibuprofen-Famotidine (DUEXIS) 800-26.6 MG TABS Take 1 tablet by mouth 3 (three) times daily. 90 tablet 1  . levothyroxine (SYNTHROID, LEVOTHROID) 150 MCG tablet TAKE 1 TABLET (150 MCG TOTAL) BY MOUTH DAILY BEFORE BREAKFAST. 30 tablet 6  . loratadine (CLARITIN) 10 MG tablet Take 10 mg by mouth daily as needed.    . NONFORMULARY OR COMPOUNDED ITEM Apply 1-2 g topically 4 (four) times daily. 120 each 2  . Vitamin D, Ergocalciferol, (DRISDOL) 50000 UNITS CAPS capsule      Current  Facility-Administered Medications on File Prior to Visit  Medication Dose Route Frequency Provider Last Rate Last Dose  . betamethasone acetate-betamethasone sodium phosphate (CELESTONE) injection 3 mg  3 mg Intramuscular Once Edrick Kins, DPM      . betamethasone acetate-betamethasone sodium phosphate (CELESTONE) injection 3 mg  3 mg Intramuscular Once Edrick Kins, DPM        Social History  Substance Use Topics  . Smoking status: Never Smoker  . Smokeless tobacco: Not on file  . Alcohol use 0.0 oz/week    Review of Systems  Constitutional: Negative for chills and fever.  HENT: Positive for congestion, ear pain and sinus pressure. Negative for ear discharge and sore throat.   Respiratory: Positive for cough. Negative for shortness of breath and wheezing.   Cardiovascular: Negative for chest pain and palpitations.  Gastrointestinal: Negative for nausea and vomiting.      Objective:    BP 118/76   Pulse 73   Temp 98.3 F (36.8 C) (Oral)   Ht 5\' 6"  (1.676 m)   Wt 228 lb 6.4 oz (103.6 kg)   SpO2 97%   BMI 36.86 kg/m    Physical Exam  Constitutional: She appears well-developed and well-nourished.  HENT:  Head: Normocephalic and atraumatic.  Right  Ear: Hearing, tympanic membrane, external ear and ear canal normal. No drainage, swelling or tenderness. No foreign bodies. Tympanic membrane is not erythematous and not bulging. No middle ear effusion. No decreased hearing is noted.  Left Ear: Hearing, external ear and ear canal normal. No drainage, swelling or tenderness. No foreign bodies. Tympanic membrane is erythematous. Tympanic membrane is not bulging. A middle ear effusion is present. No decreased hearing is noted.  Nose: Nose normal. No rhinorrhea. Right sinus exhibits no maxillary sinus tenderness and no frontal sinus tenderness. Left sinus exhibits no maxillary sinus tenderness and no frontal sinus tenderness.  Mouth/Throat: Uvula is midline, oropharynx is clear and moist  and mucous membranes are normal. No oropharyngeal exudate, posterior oropharyngeal edema, posterior oropharyngeal erythema or tonsillar abscesses.  Eyes: Conjunctivae are normal.  Cardiovascular: Regular rhythm, normal heart sounds and normal pulses.   Pulmonary/Chest: Effort normal and breath sounds normal. She has no wheezes. She has no rhonchi. She has no rales.  Lymphadenopathy:       Head (right side): No submental, no submandibular, no tonsillar, no preauricular, no posterior auricular and no occipital adenopathy present.       Head (left side): No submental, no submandibular, no tonsillar, no preauricular, no posterior auricular and no occipital adenopathy present.    She has no cervical adenopathy.  Neurological: She is alert.  Skin: Skin is warm and dry.  Psychiatric: She has a normal mood and affect. Her speech is normal and behavior is normal. Thought content normal.  Vitals reviewed.      Assessment & Plan:  1. Left non-suppurative otitis media Afebrile. Left otitis media with concurrent bronchitis. Will treat with amoxicillin based on duration of symptoms. PRN cough syrup. Return precautions given.  - promethazine-dextromethorphan (PROMETHAZINE-DM) 6.25-15 MG/5ML syrup; Take 5 mLs by mouth at bedtime as needed for cough.  Dispense: 40 mL; Refill: 1 - amoxicillin (AMOXIL) 500 MG capsule; Take 1 capsule (500 mg total) by mouth 2 (two) times daily.  Dispense: 14 capsule; Refill: 0     I have discontinued Ms. Ruland's amoxicillin-clavulanate. I am also having her start on promethazine-dextromethorphan and amoxicillin. Additionally, I am having her maintain her Vitamin D (Ergocalciferol), Azelastine-Fluticasone, loratadine, DEXILANT, Ibuprofen-Famotidine, NONFORMULARY OR COMPOUNDED ITEM, and levothyroxine. We will continue to administer betamethasone acetate-betamethasone sodium phosphate and betamethasone acetate-betamethasone sodium phosphate.   Meds ordered this encounter    Medications  . promethazine-dextromethorphan (PROMETHAZINE-DM) 6.25-15 MG/5ML syrup    Sig: Take 5 mLs by mouth at bedtime as needed for cough.    Dispense:  40 mL    Refill:  1    Order Specific Question:   Supervising Provider    Answer:   Derrel Nip, TERESA L [[id number]]  . amoxicillin (AMOXIL) 500 MG capsule    Sig: Take 1 capsule (500 mg total) by mouth 2 (two) times daily.    Dispense:  14 capsule    Refill:  0    Order Specific Question:   Supervising Provider    Answer:   Crecencio Mc [[id number]]    Return precautions given.   Risks, benefits, and alternatives of the medications and treatment plan prescribed today were discussed, and patient expressed understanding.   Education regarding symptom management and diagnosis given to patient on AVS.  Continue to follow with [name], MD for routine health maintenance.   Lenoria Farrier and I agreed with plan.   Mable Paris, FNP

## 2016-12-01 ENCOUNTER — Encounter: Payer: Self-pay | Admitting: Family

## 2016-12-11 ENCOUNTER — Telehealth: Payer: Self-pay | Admitting: Family Medicine

## 2016-12-11 NOTE — Telephone Encounter (Signed)
Patient stated that her mucus has changed to pale color since finishing antibiotics.  Feels a little better but still having bad congestion. Also she stated that she may have caught something from her children yesterday. Please advise.

## 2016-12-11 NOTE — Telephone Encounter (Signed)
Left message for patient to return call back.  

## 2016-12-11 NOTE — Telephone Encounter (Signed)
Pt called and stated that she saw M. Arnett on 12/28 for a sinus infection and it has not resolved. She is still having a lot of pressure in her ears along with general congestion. Please advise, thank you!  Call pt @ [phone]

## 2016-12-11 NOTE — Telephone Encounter (Signed)
Patient has been notified.  Patient had no questions, comments, or concerns at this time. 

## 2016-12-11 NOTE — Telephone Encounter (Signed)
Call pt-  I started her on amoxicillin which is great agent for ear infection and sinus congestion.   She is still on the medication, which I would prefer her to finish.   Is she not feeling better even slightly?  If not better at all, we can try augmentin.

## 2016-12-11 NOTE — Telephone Encounter (Signed)
Pleased to see improvement.   At this point, I would advise to stay the course a few more days with mucinex and other remedies below.   Most likely viral if caught something from kids yesterday so another antibiotic would not be appropriate.   Increase intake of clear fluids. Congestion is best treated by hydration, when mucus is wetter, it is thinner, less sticky, and easier to expel from the body, either through coughing up drainage, or by blowing your nose.   Get plenty of rest.   Use saline nasal drops and blow your nose frequently. Run a humidifier at night and elevate the head of the bed. Vicks Vapor rub will help with congestion and cough. Steam showers and sinus massage for congestion.   Use Acetaminophen or Ibuprofen as needed for fever or pain. Avoid second hand smoke. Even the smallest exposure will worsen symptoms.   This illness will typically last 7 - 10 days.   Please follow up with our clinic if you develop a fever greater than 101 F, symptoms worsen, or do not resolve in the next week.

## 2016-12-11 NOTE — Telephone Encounter (Signed)
Reason for call: sinus Symptoms:sinus congestion,sinus pain, sinus pressure  ear pain /pressure, malaise, fever not measureds 2 nights ago, kids tested negative for RSV and flu Duration sinus early December  Medications:Mucinex /Flonase Last seen for this problem:11/27/16 Seen MD:[id number] Arnett FNP No appointment available today Please advise

## 2016-12-12 ENCOUNTER — Ambulatory Visit (INDEPENDENT_AMBULATORY_CARE_PROVIDER_SITE_OTHER): Payer: BLUE CROSS/BLUE SHIELD | Admitting: Family Medicine

## 2016-12-12 ENCOUNTER — Ambulatory Visit (INDEPENDENT_AMBULATORY_CARE_PROVIDER_SITE_OTHER): Payer: BLUE CROSS/BLUE SHIELD | Admitting: Podiatry

## 2016-12-12 DIAGNOSIS — M722 Plantar fascial fibromatosis: Secondary | ICD-10-CM

## 2016-12-12 DIAGNOSIS — M79671 Pain in right foot: Secondary | ICD-10-CM | POA: Diagnosis not present

## 2016-12-12 DIAGNOSIS — Z0289 Encounter for other administrative examinations: Secondary | ICD-10-CM

## 2016-12-13 DIAGNOSIS — H109 Unspecified conjunctivitis: Secondary | ICD-10-CM | POA: Diagnosis not present

## 2016-12-13 DIAGNOSIS — J019 Acute sinusitis, unspecified: Secondary | ICD-10-CM | POA: Diagnosis not present

## 2016-12-17 NOTE — Progress Notes (Signed)
Patient ID: Stephanie Shields, female   DOB: 1971/04/22, 47 y.o.   MRN: [id number] Subjective: Patient presents today for follow-up evaluation of plantar fasciitis to the right foot. Patient states that she's doing great.  Objective: Physical Exam General: The patient is alert and oriented x3 in no acute distress.  Dermatology: Skin is warm, dry and supple bilateral lower extremities. Negative for open lesions or macerations bilateral.   Vascular: Dorsalis Pedis and Posterior Tibial pulses palpable bilateral.  Capillary fill time is immediate to all digits.  Neurological: Epicritic and protective threshold intact bilateral.   Musculoskeletal: Negative for Tenderness to palpation at the medial calcaneal tubercale and through the insertion of the plantar fascia of the right foot. All other joints range of motion within normal limits bilateral. Strength 5/5 in all groups bilateral.    Assessment: 1. Plantar fasciitis right-improved 2. Pain in right foot-improved   Plan of Care:  1. Patient evaluated. Patient status is improved. 2. Patient doing very well with her custom molded orthotics. 3. Return to clinic when necessary

## 2016-12-24 ENCOUNTER — Encounter: Payer: Self-pay | Admitting: Family Medicine

## 2016-12-24 ENCOUNTER — Ambulatory Visit (INDEPENDENT_AMBULATORY_CARE_PROVIDER_SITE_OTHER): Payer: BLUE CROSS/BLUE SHIELD | Admitting: Family Medicine

## 2016-12-24 VITALS — BP 104/70 | HR 66 | Temp 98.6°F | Wt 230.8 lb

## 2016-12-24 DIAGNOSIS — J329 Chronic sinusitis, unspecified: Secondary | ICD-10-CM | POA: Diagnosis not present

## 2016-12-24 DIAGNOSIS — E6609 Other obesity due to excess calories: Secondary | ICD-10-CM | POA: Diagnosis not present

## 2016-12-24 DIAGNOSIS — D229 Melanocytic nevi, unspecified: Secondary | ICD-10-CM | POA: Diagnosis not present

## 2016-12-24 DIAGNOSIS — E039 Hypothyroidism, unspecified: Secondary | ICD-10-CM

## 2016-12-24 DIAGNOSIS — Z6836 Body mass index (BMI) 36.0-36.9, adult: Secondary | ICD-10-CM

## 2016-12-24 LAB — HEMOGLOBIN A1C: Hgb A1c MFr Bld: 5.8 % (ref 4.6–6.5)

## 2016-12-24 LAB — TSH: TSH: 0.59 u[IU]/mL (ref 0.35–4.50)

## 2016-12-24 NOTE — Assessment & Plan Note (Signed)
Check TSH.  Continue Synthroid. 

## 2016-12-24 NOTE — Assessment & Plan Note (Signed)
Currently asymptomatic. Given her current issues with sinus infections and allergies we will have her see ENT. Offered referral though she notes that she can contact them to set up an appointment on her own.

## 2016-12-24 NOTE — Progress Notes (Signed)
  Stephanie Rumps, MD Phone: [phone]  Stephanie Shields is a 46 y.o. female who presents today for follow-up.  Hypothyroidism: Taking Synthroid. No weight changes. No skin changes. No cold or heat intolerance.  Patient reports frequent sinus infections. Several times a year. This most recent time required amoxicillin and Levaquin. It is clearing up this time. She notes no upper respiratory symptoms at this time.  Patient notes 2 new moles on the left aspect of her face and neck. The one on her neck she is unsure how long it has been there. She is unsure if it has changed. The one on her face has been there several weeks and does itch some. Has not grown any since it is become apparent. No rash around it.  PMH: nonsmoker.   ROS see history of present illness  Objective  Physical Exam Vitals:   12/24/16 1405  BP: 104/70  Pulse: 66  Temp: 98.6 F (37 C)    BP Readings from Last 3 Encounters:  12/24/16 104/70  11/27/16 118/76  06/19/16 108/66   Wt Readings from Last 3 Encounters:  12/24/16 230 lb 12.8 oz (104.7 kg)  11/27/16 228 lb 6.4 oz (103.6 kg)  09/24/16 226 lb (102.5 kg)    Physical Exam  Constitutional: No distress.  HENT:  Head: Normocephalic and atraumatic.    Mouth/Throat: Oropharynx is clear and moist. No oropharyngeal exudate.  Normal TMs bilaterally  Eyes: Conjunctivae are normal. Pupils are equal, round, and reactive to light.  Cardiovascular: Normal rate, regular rhythm and normal heart sounds.   Pulmonary/Chest: Effort normal and breath sounds normal.  Skin: She is not diaphoretic.     Assessment/Plan: Please see individual problem list.  Hypothyroidism Check TSH. Continue Synthroid.  Recurrent sinusitis Currently asymptomatic. Given her current issues with sinus infections and allergies we will have her see ENT. Offered referral though she notes that she can contact them to set up an appointment on her own.  Multiple benign nevi Patient  with 2 new benign-appearing nevi. They do not meet ABCD criteria. Discussed that they appear benign and that she can continue to monitor them. If there is itching associated with the one on her cheek she could try topical Benadryl. Discussed that if they change in any manner she should be reevaluated.   Orders Placed This Encounter  Procedures  . HgB A1c  . TSH   Stephanie Rumps, MD [place]

## 2016-12-24 NOTE — Patient Instructions (Signed)
Nice to see you. We will get some lab work today and contact you with the results. Please monitor the mole on your face and neck. If these change at all in any manner please have them looked at again.  Please contact ENT at her convenience for evaluation for frequent sinus infections.

## 2016-12-24 NOTE — Assessment & Plan Note (Signed)
Patient with 2 new benign-appearing nevi. They do not meet ABCD criteria. Discussed that they appear benign and that she can continue to monitor them. If there is itching associated with the one on her cheek she could try topical Benadryl. Discussed that if they change in any manner she should be reevaluated.

## 2016-12-24 NOTE — Progress Notes (Signed)
Pre visit review using our clinic review tool, if applicable. No additional management support is needed unless otherwise documented below in the visit note. 

## 2016-12-26 DIAGNOSIS — G379 Demyelinating disease of central nervous system, unspecified: Secondary | ICD-10-CM | POA: Diagnosis not present

## 2016-12-29 ENCOUNTER — Other Ambulatory Visit: Payer: Self-pay | Admitting: Psychiatry

## 2016-12-29 DIAGNOSIS — G35 Multiple sclerosis: Secondary | ICD-10-CM

## 2017-01-05 ENCOUNTER — Ambulatory Visit
Admission: RE | Admit: 2017-01-05 | Discharge: 2017-01-05 | Disposition: A | Discharge status: Home / Self Care | Payer: BLUE CROSS/BLUE SHIELD | Source: Ambulatory Visit | Attending: Psychiatry | Admitting: Psychiatry

## 2017-01-05 DIAGNOSIS — G936 Cerebral edema: Secondary | ICD-10-CM | POA: Diagnosis not present

## 2017-01-05 DIAGNOSIS — G35 Multiple sclerosis: Secondary | ICD-10-CM

## 2017-01-05 DIAGNOSIS — D1802 Hemangioma of intracranial structures: Secondary | ICD-10-CM | POA: Insufficient documentation

## 2017-01-05 DIAGNOSIS — J320 Chronic maxillary sinusitis: Secondary | ICD-10-CM | POA: Insufficient documentation

## 2017-01-05 MED ORDER — GADOBENATE DIMEGLUMINE 529 MG/ML IV SOLN
20.0000 mL | Freq: Once | INTRAVENOUS | Status: AC | PRN
  Administered 2017-01-05: 20 mL via INTRAVENOUS

## 2017-01-14 DIAGNOSIS — M62838 Other muscle spasm: Secondary | ICD-10-CM | POA: Diagnosis not present

## 2017-01-14 DIAGNOSIS — M6281 Muscle weakness (generalized): Secondary | ICD-10-CM | POA: Diagnosis not present

## 2017-01-14 DIAGNOSIS — N393 Stress incontinence (female) (male): Secondary | ICD-10-CM | POA: Diagnosis not present

## 2017-02-01 DIAGNOSIS — J040 Acute laryngitis: Secondary | ICD-10-CM | POA: Diagnosis not present

## 2017-02-01 DIAGNOSIS — R05 Cough: Secondary | ICD-10-CM | POA: Diagnosis not present

## 2017-02-01 DIAGNOSIS — J019 Acute sinusitis, unspecified: Secondary | ICD-10-CM | POA: Diagnosis not present

## 2017-02-04 DIAGNOSIS — M62838 Other muscle spasm: Secondary | ICD-10-CM | POA: Diagnosis not present

## 2017-02-04 DIAGNOSIS — N393 Stress incontinence (female) (male): Secondary | ICD-10-CM | POA: Diagnosis not present

## 2017-02-04 DIAGNOSIS — M6281 Muscle weakness (generalized): Secondary | ICD-10-CM | POA: Diagnosis not present

## 2017-02-18 DIAGNOSIS — M6281 Muscle weakness (generalized): Secondary | ICD-10-CM | POA: Diagnosis not present

## 2017-02-18 DIAGNOSIS — N393 Stress incontinence (female) (male): Secondary | ICD-10-CM | POA: Diagnosis not present

## 2017-02-18 DIAGNOSIS — M62838 Other muscle spasm: Secondary | ICD-10-CM | POA: Diagnosis not present

## 2017-03-04 DIAGNOSIS — M62838 Other muscle spasm: Secondary | ICD-10-CM | POA: Diagnosis not present

## 2017-03-04 DIAGNOSIS — N393 Stress incontinence (female) (male): Secondary | ICD-10-CM | POA: Diagnosis not present

## 2017-03-04 DIAGNOSIS — M6281 Muscle weakness (generalized): Secondary | ICD-10-CM | POA: Diagnosis not present

## 2017-03-25 DIAGNOSIS — M6281 Muscle weakness (generalized): Secondary | ICD-10-CM | POA: Diagnosis not present

## 2017-03-25 DIAGNOSIS — N393 Stress incontinence (female) (male): Secondary | ICD-10-CM | POA: Diagnosis not present

## 2017-03-25 DIAGNOSIS — M62838 Other muscle spasm: Secondary | ICD-10-CM | POA: Diagnosis not present

## 2017-04-15 DIAGNOSIS — M62838 Other muscle spasm: Secondary | ICD-10-CM | POA: Diagnosis not present

## 2017-04-15 DIAGNOSIS — M6281 Muscle weakness (generalized): Secondary | ICD-10-CM | POA: Diagnosis not present

## 2017-04-15 DIAGNOSIS — N393 Stress incontinence (female) (male): Secondary | ICD-10-CM | POA: Diagnosis not present

## 2017-05-06 DIAGNOSIS — M6281 Muscle weakness (generalized): Secondary | ICD-10-CM | POA: Diagnosis not present

## 2017-05-06 DIAGNOSIS — M62838 Other muscle spasm: Secondary | ICD-10-CM | POA: Diagnosis not present

## 2017-05-06 DIAGNOSIS — N393 Stress incontinence (female) (male): Secondary | ICD-10-CM | POA: Diagnosis not present

## 2017-05-12 DIAGNOSIS — J329 Chronic sinusitis, unspecified: Secondary | ICD-10-CM | POA: Diagnosis not present

## 2017-05-12 DIAGNOSIS — J301 Allergic rhinitis due to pollen: Secondary | ICD-10-CM | POA: Diagnosis not present

## 2017-05-28 DIAGNOSIS — N393 Stress incontinence (female) (male): Secondary | ICD-10-CM | POA: Diagnosis not present

## 2017-05-28 DIAGNOSIS — M6281 Muscle weakness (generalized): Secondary | ICD-10-CM | POA: Diagnosis not present

## 2017-05-28 DIAGNOSIS — M62838 Other muscle spasm: Secondary | ICD-10-CM | POA: Diagnosis not present

## 2017-06-23 ENCOUNTER — Ambulatory Visit (INDEPENDENT_AMBULATORY_CARE_PROVIDER_SITE_OTHER): Payer: BLUE CROSS/BLUE SHIELD | Admitting: Family Medicine

## 2017-06-23 ENCOUNTER — Encounter: Payer: Self-pay | Admitting: Family Medicine

## 2017-06-23 VITALS — BP 102/70 | HR 63 | Temp 98.0°F | Wt 231.0 lb

## 2017-06-23 DIAGNOSIS — K219 Gastro-esophageal reflux disease without esophagitis: Secondary | ICD-10-CM

## 2017-06-23 DIAGNOSIS — J329 Chronic sinusitis, unspecified: Secondary | ICD-10-CM | POA: Diagnosis not present

## 2017-06-23 DIAGNOSIS — E039 Hypothyroidism, unspecified: Secondary | ICD-10-CM

## 2017-06-23 DIAGNOSIS — G5603 Carpal tunnel syndrome, bilateral upper limbs: Secondary | ICD-10-CM

## 2017-06-23 DIAGNOSIS — E559 Vitamin D deficiency, unspecified: Secondary | ICD-10-CM | POA: Diagnosis not present

## 2017-06-23 DIAGNOSIS — R7303 Prediabetes: Secondary | ICD-10-CM | POA: Diagnosis not present

## 2017-06-23 DIAGNOSIS — G5600 Carpal tunnel syndrome, unspecified upper limb: Secondary | ICD-10-CM | POA: Insufficient documentation

## 2017-06-23 LAB — COMPREHENSIVE METABOLIC PANEL
ALBUMIN: 4 g/dL (ref 3.5–5.2)
ALK PHOS: 51 U/L (ref 39–117)
ALT: 22 U/L (ref 0–35)
AST: 19 U/L (ref 0–37)
BUN: 15 mg/dL (ref 6–23)
CHLORIDE: 104 meq/L (ref 96–112)
CO2: 28 mEq/L (ref 19–32)
Calcium: 8.7 mg/dL (ref 8.4–10.5)
Creatinine, Ser: 0.69 mg/dL (ref 0.40–1.20)
GFR: 98.22 mL/min (ref >60.00)
Glucose, Bld: 104 mg/dL — ABNORMAL HIGH (ref 70–99)
POTASSIUM: 4 meq/L (ref 3.5–5.1)
SODIUM: 136 meq/L (ref 135–145)
TOTAL PROTEIN: 7 g/dL (ref 6.0–8.3)
Total Bilirubin: 0.7 mg/dL (ref 0.2–1.2)

## 2017-06-23 LAB — TSH: TSH: 0.91 u[IU]/mL (ref 0.35–4.50)

## 2017-06-23 LAB — VITAMIN D 25 HYDROXY (VIT D DEFICIENCY, FRACTURES): VITD: 34.39 ng/mL (ref 30.00–100.00)

## 2017-06-23 LAB — HEMOGLOBIN A1C: Hgb A1c MFr Bld: 5.9 % (ref 4.6–6.5)

## 2017-06-23 MED ORDER — AMOXICILLIN-POT CLAVULANATE 875-125 MG PO TABS: 1.0000 | ORAL_TABLET | Freq: Two times a day (BID) | ORAL | 0 refills | Status: DC

## 2017-06-23 MED ORDER — DEXLANSOPRAZOLE 60 MG PO CPDR: 60.0000 mg | DELAYED_RELEASE_CAPSULE | Freq: Every day | ORAL | 2 refills | Status: DC

## 2017-06-23 MED ORDER — LEVOTHYROXINE SODIUM 150 MCG PO TABS: 150.0000 ug | ORAL_TABLET | Freq: Every day | ORAL | 3 refills | Status: DC

## 2017-06-23 NOTE — Assessment & Plan Note (Signed)
Check TSH.  Continue Synthroid. 

## 2017-06-23 NOTE — Patient Instructions (Signed)
Nice to see you. Please try the splints you have at home for your wrists. Please continue to work on diet and exercise. Please take the Augmentin for your sinus infection. Please let your neurologist know about the symptoms in your hands.

## 2017-06-23 NOTE — Assessment & Plan Note (Signed)
Patient appears to have symptoms of recurrent sinusitis. Given persistence of symptoms we'll treat with Augmentin. Advised on probiotics and yogurt. She sees ENT next week.

## 2017-06-23 NOTE — Progress Notes (Signed)
[name], MD Phone: [phone]  Stephanie Shields is a 46 y.o. female who presents today for follow-up.  HYPOTHYROIDISM Disease Monitoring Weight changes: no  Skin Changes: no Heat/Cold intolerance: no  Medication Monitoring Compliance:  Taking synthroid    Last TSH:   Lab Results  Component Value Date   TSH 0.59 12/24/2016   GERD: Taking Dexilant. Reflux is controlled well with this. No blood in her stool. No abdominal pain.  Prediabetes: Last A1c 5.8. She's been changing her diet with lots of vegetables. Has cut back on sweets intake. Also has been exercising by doing rowing 3 days a week and doing landscaping in the yard.  Continues to have intermittent issues with sinus congestion and has seen ENT for this. Has a CT scan scheduled for next week. Follows up with them at that time as well. Note she has had symptoms since July 1 with congestion. Did some nasal rinses and that did help some though continues to have symptoms. Feels as though it is somewhat moved into her chest with chest congestion and cough. Slight trouble breathing when laying down last night though improved with propping herself up. No chest pain. No fevers.  Reports intermittent tingling and numbness in her bilateral palmar aspect of her hands. Typically is worse when sleeping and right when she wakes up. She has been using her hands more frequently over the last several months. Has been going on for last couple of months. No numbness elsewhere. No weakness. Does have a history of carpal tunnel syndrome.  History of vitamin D deficiency. Has been on a supplement. Vitamin D checked.  PMH: nonsmoker.   ROS see history of present illness  Objective  Physical Exam Vitals:   06/23/17 1040  BP: 102/70  Pulse: 63  Temp: 98 F (36.7 C)    BP Readings from Last 3 Encounters:  06/23/17 102/70  12/24/16 104/70  11/27/16 118/76   Wt Readings from Last 3 Encounters:  06/23/17 231 lb (104.8 kg)  12/24/16  230 lb 12.8 oz (104.7 kg)  11/27/16 228 lb 6.4 oz (103.6 kg)    Physical Exam  Constitutional: No distress.  HENT:  Head: Normocephalic and atraumatic.  Mouth/Throat: Oropharynx is clear and moist. No oropharyngeal exudate.  Normal TMs  Eyes: Pupils are equal, round, and reactive to light. Conjunctivae are normal.  Neck: Neck supple.  Cardiovascular: Normal rate, regular rhythm and normal heart sounds.   Pulmonary/Chest: Effort normal and breath sounds normal.  Musculoskeletal: She exhibits no edema.  Negative Tinel's, positive Phalen's bilaterally  Lymphadenopathy:    She has no cervical adenopathy.  Neurological: She is alert. Gait normal.  CN 2-12 intact, 5/5 strength in bilateral biceps, triceps, grip, quads, hamstrings, plantar and dorsiflexion, sensation to light touch intact in bilateral UE and LE, normal gait  Skin: Skin is warm and dry. She is not diaphoretic.     Assessment/Plan: Please see individual problem list.  Recurrent sinusitis Patient appears to have symptoms of recurrent sinusitis. Given persistence of symptoms we'll treat with Augmentin. Advised on probiotics and yogurt. She sees ENT next week.  GERD (gastroesophageal reflux disease) A refill of Dexilant given.  Hypothyroidism Check TSH. Continue Synthroid.  Prediabetes Check A1c. Continue diet and exercise.  Carpal tunnel syndrome Suspect wrist and hand symptoms due to carpal tunnel syndrome. Neurologically intact. Positive Phalen's. Advised that she should let her neurologist know given prior neurological history though history and exam findings most consistent with carpal tunnel syndrome. She has braces  to wear at home already. She'll wear these. If not improving she'll let us know.  Vitamin D deficiency Check vitamin D.   Orders Placed This Encounter  Procedures  . Vitamin D (25 hydroxy)  . HgB A1c  . TSH  . Comp Met (CMET)   [name], MD [place]

## 2017-06-23 NOTE — Assessment & Plan Note (Signed)
Check vitamin D. 

## 2017-06-23 NOTE — Progress Notes (Signed)
foll

## 2017-06-23 NOTE — Assessment & Plan Note (Signed)
A refill of Dexilant given.

## 2017-06-23 NOTE — Assessment & Plan Note (Signed)
Suspect wrist and hand symptoms due to carpal tunnel syndrome. Neurologically intact. Positive Phalen's. Advised that she should let her neurologist know given prior neurological history though history and exam findings most consistent with carpal tunnel syndrome. She has braces to wear at home already. She'll wear these. If not improving she'll let us know.

## 2017-06-23 NOTE — Assessment & Plan Note (Signed)
Check A1c.  Continue diet and exercise. 

## 2017-06-30 DIAGNOSIS — J3489 Other specified disorders of nose and nasal sinuses: Secondary | ICD-10-CM | POA: Diagnosis not present

## 2017-06-30 DIAGNOSIS — J329 Chronic sinusitis, unspecified: Secondary | ICD-10-CM | POA: Diagnosis not present

## 2017-06-30 DIAGNOSIS — J320 Chronic maxillary sinusitis: Secondary | ICD-10-CM | POA: Diagnosis not present

## 2017-06-30 DIAGNOSIS — J301 Allergic rhinitis due to pollen: Secondary | ICD-10-CM | POA: Diagnosis not present

## 2017-06-30 DIAGNOSIS — J343 Hypertrophy of nasal turbinates: Secondary | ICD-10-CM | POA: Diagnosis not present

## 2017-06-30 DIAGNOSIS — J342 Deviated nasal septum: Secondary | ICD-10-CM | POA: Diagnosis not present

## 2017-07-04 ENCOUNTER — Other Ambulatory Visit: Payer: Self-pay | Admitting: Family Medicine

## 2017-07-14 ENCOUNTER — Ambulatory Visit: Payer: BLUE CROSS/BLUE SHIELD

## 2017-08-07 DIAGNOSIS — J310 Chronic rhinitis: Secondary | ICD-10-CM | POA: Diagnosis not present

## 2017-08-07 DIAGNOSIS — E669 Obesity, unspecified: Secondary | ICD-10-CM | POA: Diagnosis not present

## 2017-08-07 DIAGNOSIS — Z6836 Body mass index (BMI) 36.0-36.9, adult: Secondary | ICD-10-CM | POA: Diagnosis not present

## 2017-08-07 DIAGNOSIS — Z1629 Resistance to other single specified antibiotic: Secondary | ICD-10-CM | POA: Diagnosis not present

## 2017-08-07 DIAGNOSIS — J328 Other chronic sinusitis: Secondary | ICD-10-CM | POA: Diagnosis not present

## 2017-08-07 DIAGNOSIS — Z79899 Other long term (current) drug therapy: Secondary | ICD-10-CM | POA: Diagnosis not present

## 2017-08-07 DIAGNOSIS — J342 Deviated nasal septum: Secondary | ICD-10-CM | POA: Diagnosis not present

## 2017-08-07 DIAGNOSIS — Z1611 Resistance to penicillins: Secondary | ICD-10-CM | POA: Diagnosis not present

## 2017-08-07 DIAGNOSIS — Z6834 Body mass index (BMI) 34.0-34.9, adult: Secondary | ICD-10-CM | POA: Diagnosis not present

## 2017-08-07 DIAGNOSIS — B953 Streptococcus pneumoniae as the cause of diseases classified elsewhere: Secondary | ICD-10-CM | POA: Diagnosis not present

## 2017-08-07 DIAGNOSIS — J343 Hypertrophy of nasal turbinates: Secondary | ICD-10-CM | POA: Diagnosis not present

## 2017-08-07 DIAGNOSIS — J3489 Other specified disorders of nose and nasal sinuses: Secondary | ICD-10-CM | POA: Diagnosis not present

## 2017-08-07 DIAGNOSIS — J324 Chronic pansinusitis: Secondary | ICD-10-CM | POA: Diagnosis not present

## 2017-08-07 DIAGNOSIS — K219 Gastro-esophageal reflux disease without esophagitis: Secondary | ICD-10-CM | POA: Diagnosis not present

## 2017-08-17 DIAGNOSIS — J3489 Other specified disorders of nose and nasal sinuses: Secondary | ICD-10-CM | POA: Diagnosis not present

## 2017-08-17 DIAGNOSIS — J324 Chronic pansinusitis: Secondary | ICD-10-CM | POA: Diagnosis not present

## 2017-09-14 DIAGNOSIS — J3489 Other specified disorders of nose and nasal sinuses: Secondary | ICD-10-CM | POA: Diagnosis not present

## 2017-10-09 ENCOUNTER — Ambulatory Visit (INDEPENDENT_AMBULATORY_CARE_PROVIDER_SITE_OTHER): Payer: BLUE CROSS/BLUE SHIELD | Admitting: Psychology

## 2017-10-09 DIAGNOSIS — F4323 Adjustment disorder with mixed anxiety and depressed mood: Secondary | ICD-10-CM | POA: Diagnosis not present

## 2017-10-19 ENCOUNTER — Ambulatory Visit (INDEPENDENT_AMBULATORY_CARE_PROVIDER_SITE_OTHER): Payer: BLUE CROSS/BLUE SHIELD | Admitting: Psychology

## 2017-10-19 DIAGNOSIS — F4323 Adjustment disorder with mixed anxiety and depressed mood: Secondary | ICD-10-CM

## 2017-10-28 ENCOUNTER — Ambulatory Visit: Payer: BLUE CROSS/BLUE SHIELD | Admitting: Psychology

## 2017-10-30 ENCOUNTER — Ambulatory Visit: Payer: BLUE CROSS/BLUE SHIELD | Admitting: Podiatry

## 2017-11-06 ENCOUNTER — Ambulatory Visit (INDEPENDENT_AMBULATORY_CARE_PROVIDER_SITE_OTHER): Payer: BLUE CROSS/BLUE SHIELD

## 2017-11-06 ENCOUNTER — Encounter: Payer: Self-pay | Admitting: Podiatry

## 2017-11-06 ENCOUNTER — Ambulatory Visit: Payer: BLUE CROSS/BLUE SHIELD | Admitting: Podiatry

## 2017-11-06 DIAGNOSIS — M722 Plantar fascial fibromatosis: Secondary | ICD-10-CM

## 2017-11-06 MED ORDER — NONFORMULARY OR COMPOUNDED ITEM: 2 refills | Status: DC

## 2017-11-09 NOTE — Progress Notes (Signed)
   Subjective: 46 year old female presents today for follow-up evaluation of right plantar fasciitis.  She states her pain resolved but returned a few months ago.  She also reports a new complaint of intermittent pain to the lateral side of the left foot that began 1-2 months ago.  Walking increases the pain.  There are no alleviating factors noted.  She has been taking Duexis to treat the symptoms.  She is requesting new orthotics. Patient presents today for further treatment and evaluation.   Past Medical History:  Diagnosis Date  . Allergic rhinitis   . 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 Elevated cholesterol   . GERD (gastroesophageal reflux disease)   . History of blood transfusion   . Hypothyroidism   . Urinary incontinence    After delivery of her recent baby     Objective: Physical Exam General: The patient is alert and oriented x3 in no acute distress.  Dermatology: Skin is warm, dry and supple bilateral lower extremities. Negative for open lesions or macerations bilateral.   Vascular: Dorsalis Pedis and Posterior Tibial pulses palpable bilateral.  Capillary fill time is immediate to all digits.  Neurological: Epicritic and protective threshold intact bilateral.   Musculoskeletal: Tenderness to palpation at the medial calcaneal tubercale and through the insertion of the plantar fascia of the right foot. All other joints range of motion within normal limits bilateral. Strength 5/5 in all groups bilateral.  Pain with palpation to the fifth metatarsal of the left foot.  Radiographic exam: Normal and laterally deviated fifth metatarsal at the distal one third metatarsal neck possibly due to old stress fracture.  Assessment: 1. Plantar fasciitis right 2.  Left fifth metatarsal pain  Plan of Care:  1. Patient evaluated. Xrays reviewed.   2. Injection of 0.5cc Celestone soluspan injected into the right  plantar fascia  3.  Continue taking Duexis. 4.  Refill prescription for pain cream to be dispensed from [facility]. 5.  Continue wearing custom molded orthotics. 6.  Return to clinic in 4 weeks.   Edrick Kins, DPM [facility]  Dr. Edrick Kins, DPM    [address],  [address]                Office [phone]  Fax [phone]

## 2017-11-26 DIAGNOSIS — J029 Acute pharyngitis, unspecified: Secondary | ICD-10-CM | POA: Diagnosis not present

## 2017-11-26 DIAGNOSIS — J019 Acute sinusitis, unspecified: Secondary | ICD-10-CM | POA: Diagnosis not present

## 2017-11-26 DIAGNOSIS — H66003 Acute suppurative otitis media without spontaneous rupture of ear drum, bilateral: Secondary | ICD-10-CM | POA: Diagnosis not present

## 2017-12-18 ENCOUNTER — Encounter: Payer: Self-pay | Admitting: Family Medicine

## 2017-12-18 ENCOUNTER — Other Ambulatory Visit: Payer: Self-pay

## 2017-12-18 ENCOUNTER — Ambulatory Visit: Payer: BLUE CROSS/BLUE SHIELD | Admitting: Family Medicine

## 2017-12-18 VITALS — BP 100/62 | HR 62 | Temp 99.0°F | Wt 220.0 lb

## 2017-12-18 DIAGNOSIS — K219 Gastro-esophageal reflux disease without esophagitis: Secondary | ICD-10-CM

## 2017-12-18 DIAGNOSIS — R7303 Prediabetes: Secondary | ICD-10-CM | POA: Diagnosis not present

## 2017-12-18 DIAGNOSIS — E039 Hypothyroidism, unspecified: Secondary | ICD-10-CM

## 2017-12-18 DIAGNOSIS — J329 Chronic sinusitis, unspecified: Secondary | ICD-10-CM

## 2017-12-18 MED ORDER — DOXYCYCLINE HYCLATE 100 MG PO TABS: 100.0000 mg | ORAL_TABLET | Freq: Two times a day (BID) | ORAL | 0 refills | Status: DC

## 2017-12-18 NOTE — Assessment & Plan Note (Signed)
Consistent with sinusitis.  Will treat with doxycycline.  Discussed sun sensitivity with this.  Given return precautions.

## 2017-12-18 NOTE — Assessment & Plan Note (Signed)
Check TSH.  Continue Synthroid. 

## 2017-12-18 NOTE — Patient Instructions (Signed)
Nice to see you. We will treat you for sinus infection with doxycycline.  If you do not improve please let us know. We will check lab work and contact you with the results. Please try to work some exercise into your routine.

## 2017-12-18 NOTE — Assessment & Plan Note (Signed)
Check A1c.  Encourage diet and exercise. 

## 2017-12-18 NOTE — Progress Notes (Signed)
  [name], MD Phone: [phone]  Stephanie Shields is a 47 y.o. female who presents today for follow-up.  Hypothyroidism: Taking Synthroid.  Weight has come down with diet and exercise.  No skin changes.  No heat or cold intolerance.  Has a history of Hashimoto's in the past and has been hypothyroid since.  Prediabetes: No polyuria or polydipsia.  She saw a nutritionist and is cut carbs out pretty much completely.  Eating much healthier.  She is going to start exercising.  GERD: Taking Dexilant.  Only has symptoms if she eats acidic foods.  No other symptoms otherwise.  No blood in her stool.  She was previously treated for ear infection as well as a sinus infection with amoxicillin.  Notes she did improve with that though 2 days after coming off antibiotics her symptoms returned with maxillary sinus congestion that is mostly worse in the morning.  She is blowing greenish mucus out of her nose.  Social History   Tobacco Use  Smoking Status Never Smoker  Smokeless Tobacco Never Used     ROS see history of present illness  Objective  Physical Exam Vitals:   12/18/17 1531  BP: 100/62  Pulse: 62  Temp: 99 F (37.2 C)  SpO2: 96%    BP Readings from Last 3 Encounters:  12/18/17 100/62  06/23/17 102/70  12/24/16 104/70   Wt Readings from Last 3 Encounters:  12/18/17 220 lb (99.8 kg)  06/23/17 231 lb (104.8 kg)  12/24/16 230 lb 12.8 oz (104.7 kg)    Physical Exam  Constitutional: No distress.  HENT:  Head: Normocephalic and atraumatic.  Mouth/Throat: Oropharynx is clear and moist. No oropharyngeal exudate.  Normal TMs  Eyes: Conjunctivae are normal. Pupils are equal, round, and reactive to light.  Cardiovascular: Normal rate, regular rhythm and normal heart sounds.  Pulmonary/Chest: Effort normal and breath sounds normal.  Musculoskeletal: She exhibits no edema.  Neurological: She is alert. Gait normal.  Skin: Skin is warm and dry. She is not diaphoretic.      Assessment/Plan: Please see individual problem list.  Recurrent sinusitis Consistent with sinusitis.  Will treat with doxycycline.  Discussed sun sensitivity with this.  Given return precautions.  Hypothyroidism Check TSH.  Continue Synthroid.  GERD (gastroesophageal reflux disease) Continue Dexilant.  Prediabetes Check A1c.  Encourage diet and exercise.   Orders Placed This Encounter  Procedures  . TSH  . HgB A1c    Meds ordered this encounter  Medications  . doxycycline (VIBRA-TABS) 100 MG tablet    Sig: Take 1 tablet (100 mg total) by mouth 2 (two) times daily.    Dispense:  14 tablet    Refill:  0     [name], MD [place]

## 2017-12-18 NOTE — Assessment & Plan Note (Signed)
Continue Dexilant 

## 2017-12-19 LAB — TSH: TSH: 1.33 mIU/L

## 2017-12-19 LAB — HEMOGLOBIN A1C
HEMOGLOBIN A1C: 5.4 %{Hb} (ref <5.7)
Mean Plasma Glucose: 108 (calc)
eAG (mmol/L): 6 (calc)

## 2017-12-28 ENCOUNTER — Ambulatory Visit (INDEPENDENT_AMBULATORY_CARE_PROVIDER_SITE_OTHER): Payer: BLUE CROSS/BLUE SHIELD | Admitting: Psychology

## 2017-12-28 DIAGNOSIS — F4323 Adjustment disorder with mixed anxiety and depressed mood: Secondary | ICD-10-CM | POA: Diagnosis not present

## 2018-01-12 ENCOUNTER — Ambulatory Visit: Payer: BLUE CROSS/BLUE SHIELD | Admitting: Podiatry

## 2018-01-22 ENCOUNTER — Ambulatory Visit: Payer: BLUE CROSS/BLUE SHIELD | Admitting: Podiatry

## 2018-01-22 ENCOUNTER — Encounter: Payer: Self-pay | Admitting: Podiatry

## 2018-01-22 DIAGNOSIS — M722 Plantar fascial fibromatosis: Secondary | ICD-10-CM | POA: Diagnosis not present

## 2018-01-22 MED ORDER — NONFORMULARY OR COMPOUNDED ITEM: 2 refills | Status: DC

## 2018-01-22 MED ORDER — IBUPROFEN-FAMOTIDINE 800-26.6 MG PO TABS: 1.0000 | ORAL_TABLET | Freq: Three times a day (TID) | ORAL | 1 refills | Status: DC

## 2018-01-25 NOTE — Progress Notes (Signed)
   Subjective: Patient presents today for follow up evaluation of right plantar fasciitis. She states she is doing better but does reports some continued intermittent pain. She has been taking Duexis which helps alleviate the pain. Patient is here for further evaluation and treatment.   Past Medical History:  Diagnosis Date  . Allergic rhinitis   . 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 Elevated cholesterol   . GERD (gastroesophageal reflux disease)   . History of blood transfusion   . Hypothyroidism   . Urinary incontinence    After delivery of her recent baby     Objective: Physical Exam General: The patient is alert and oriented x3 in no acute distress.  Dermatology: Skin is warm, dry and supple bilateral lower extremities. Negative for open lesions or macerations bilateral.   Vascular: Dorsalis Pedis and Posterior Tibial pulses palpable bilateral.  Capillary fill time is immediate to all digits.  Neurological: Epicritic and protective threshold intact bilateral.   Musculoskeletal: Tenderness to palpation at the medial calcaneal tubercale and through the insertion of the plantar fascia of the right foot. All other joints range of motion within normal limits bilateral. Strength 5/5 in all groups bilateral.   Assessment: 1. Plantar fasciitis right 2. Pain in right foot  Plan of Care:  1. Patient evaluated.  2. Injection of 0.5cc Celestone soluspan injected into the right plantar fascia  3. Refill prescription for Duexis provided to patient.  4. Refill prescription for antiinflammatory cream to be dispensed by [facility].  5. Continue wearing custom molded orthotics.  6. Return to clinic as needed.   Going on a 10 month sabbatical. Works as an Tree surgeon at [employer].     Edrick Kins, DPM [facility]  Dr. Edrick Kins, DPM    [address], [place] [address]                Office [phone]  Fax [phone]

## 2018-02-24 DIAGNOSIS — Z01419 Encounter for gynecological examination (general) (routine) without abnormal findings: Secondary | ICD-10-CM | POA: Diagnosis not present

## 2018-02-24 DIAGNOSIS — Z30431 Encounter for routine checking of intrauterine contraceptive device: Secondary | ICD-10-CM | POA: Diagnosis not present

## 2018-02-28 ENCOUNTER — Other Ambulatory Visit: Payer: Self-pay | Admitting: Family Medicine

## 2018-03-01 ENCOUNTER — Ambulatory Visit (INDEPENDENT_AMBULATORY_CARE_PROVIDER_SITE_OTHER): Payer: BLUE CROSS/BLUE SHIELD | Admitting: Psychology

## 2018-03-01 DIAGNOSIS — F4329 Adjustment disorder with other symptoms: Secondary | ICD-10-CM | POA: Diagnosis not present

## 2018-03-15 DIAGNOSIS — H109 Unspecified conjunctivitis: Secondary | ICD-10-CM | POA: Diagnosis not present

## 2018-03-15 DIAGNOSIS — J309 Allergic rhinitis, unspecified: Secondary | ICD-10-CM | POA: Diagnosis not present

## 2018-03-17 ENCOUNTER — Ambulatory Visit: Payer: Self-pay | Admitting: Family Medicine

## 2018-03-19 DIAGNOSIS — Z1231 Encounter for screening mammogram for malignant neoplasm of breast: Secondary | ICD-10-CM | POA: Diagnosis not present

## 2018-03-19 DIAGNOSIS — Z01419 Encounter for gynecological examination (general) (routine) without abnormal findings: Secondary | ICD-10-CM | POA: Diagnosis not present

## 2018-04-20 DIAGNOSIS — J301 Allergic rhinitis due to pollen: Secondary | ICD-10-CM | POA: Diagnosis not present

## 2018-04-20 DIAGNOSIS — J322 Chronic ethmoidal sinusitis: Secondary | ICD-10-CM | POA: Diagnosis not present

## 2018-05-03 ENCOUNTER — Ambulatory Visit (INDEPENDENT_AMBULATORY_CARE_PROVIDER_SITE_OTHER): Payer: BLUE CROSS/BLUE SHIELD | Admitting: Psychology

## 2018-05-03 DIAGNOSIS — F4323 Adjustment disorder with mixed anxiety and depressed mood: Secondary | ICD-10-CM

## 2018-05-10 DIAGNOSIS — G379 Demyelinating disease of central nervous system, unspecified: Secondary | ICD-10-CM | POA: Diagnosis not present

## 2018-05-11 ENCOUNTER — Other Ambulatory Visit: Payer: Self-pay | Admitting: Psychiatry

## 2018-05-11 DIAGNOSIS — G35 Multiple sclerosis: Secondary | ICD-10-CM

## 2018-05-21 ENCOUNTER — Ambulatory Visit
Admission: RE | Admit: 2018-05-21 | Discharge: 2018-05-21 | Disposition: A | Discharge status: Home / Self Care | Payer: BLUE CROSS/BLUE SHIELD | Source: Ambulatory Visit | Attending: Psychiatry | Admitting: Psychiatry

## 2018-05-21 DIAGNOSIS — G35 Multiple sclerosis: Secondary | ICD-10-CM | POA: Diagnosis not present

## 2018-05-21 DIAGNOSIS — R600 Localized edema: Secondary | ICD-10-CM | POA: Insufficient documentation

## 2018-05-21 DIAGNOSIS — Q283 Other malformations of cerebral vessels: Secondary | ICD-10-CM | POA: Insufficient documentation

## 2018-05-21 MED ORDER — GADOBENATE DIMEGLUMINE 529 MG/ML IV SOLN
20.0000 mL | Freq: Once | INTRAVENOUS | Status: AC | PRN
  Administered 2018-05-21: 20 mL via INTRAVENOUS

## 2018-06-21 ENCOUNTER — Ambulatory Visit: Payer: BLUE CROSS/BLUE SHIELD | Admitting: Family Medicine

## 2018-06-21 ENCOUNTER — Encounter: Payer: Self-pay | Admitting: Family Medicine

## 2018-06-21 VITALS — BP 120/70 | HR 66 | Temp 99.0°F | Ht 66.0 in | Wt 224.2 lb

## 2018-06-21 DIAGNOSIS — E039 Hypothyroidism, unspecified: Secondary | ICD-10-CM | POA: Diagnosis not present

## 2018-06-21 DIAGNOSIS — K219 Gastro-esophageal reflux disease without esophagitis: Secondary | ICD-10-CM | POA: Diagnosis not present

## 2018-06-21 DIAGNOSIS — M7542 Impingement syndrome of left shoulder: Secondary | ICD-10-CM | POA: Diagnosis not present

## 2018-06-21 DIAGNOSIS — E6609 Other obesity due to excess calories: Secondary | ICD-10-CM

## 2018-06-21 DIAGNOSIS — Z6836 Body mass index (BMI) 36.0-36.9, adult: Secondary | ICD-10-CM | POA: Diagnosis not present

## 2018-06-21 DIAGNOSIS — E66812 Obesity, class 2: Secondary | ICD-10-CM

## 2018-06-21 LAB — HEMOGLOBIN A1C: Hgb A1c MFr Bld: 5.7 % (ref 4.6–6.5)

## 2018-06-21 LAB — TSH: TSH: 0.56 u[IU]/mL (ref 0.35–4.50)

## 2018-06-21 NOTE — Assessment & Plan Note (Signed)
Suspect rotator cuff impingement.  Discussed exercises.  She will no longer carry her bag over her shoulder.  She bought a rolling bag today.  If not improving could consider physical therapy or injection.

## 2018-06-21 NOTE — Assessment & Plan Note (Signed)
Check TSH.  Continue Synthroid. 

## 2018-06-21 NOTE — Assessment & Plan Note (Addendum)
Adequately controlled.  Continue current regimen.  Request EGD records.

## 2018-06-21 NOTE — Progress Notes (Signed)
  [name], MD Phone: [phone]  Stephanie Shields is a 47 y.o. female who presents today for f/u.  CC: Hypothyroidism, GERD, left shoulder pain, obesity  Hypothyroidism: Taking Synthroid.  Weight is relatively stable.  No skin changes.  No heat or cold intolerance.  GERD:   Reflux symptoms: rare if she eats the wrong foods   Abd pain: no   Blood in stool: no  Dysphagia: no   EGD: history of this 5 years ago - requesting records  Medication: dexilant  Left shoulder pain: Patient notes this has been going on for some time.  She was wearing her computer bag on her left shoulder and that made it worse.  Hurts with internal rotation and abduction.  Massage is helped.  She has not tried any medications.  Obesity: She has increased her exercise drastically.  She does her rowing machine daily.  Backed off on the diet to some degree though will get back to it.   Social History   Tobacco Use  Smoking Status Never Smoker  Smokeless Tobacco Never Used     ROS see history of present illness  Objective  Physical Exam Vitals:   06/21/18 1327  BP: 120/70  Pulse: 66  Temp: 99 F (37.2 C)  SpO2: 94%    BP Readings from Last 3 Encounters:  06/21/18 120/70  12/18/17 100/62  06/23/17 102/70   Wt Readings from Last 3 Encounters:  06/21/18 224 lb 3.2 oz (101.7 kg)  12/18/17 220 lb (99.8 kg)  06/23/17 231 lb (104.8 kg)    Physical Exam  Constitutional: No distress.  Neck: No thyromegaly present.  Cardiovascular: Normal rate, regular rhythm and normal heart sounds.  Pulmonary/Chest: Effort normal and breath sounds normal.  Musculoskeletal: She exhibits no edema.  Slight tenderness and tightness in the left trapezius muscle near her shoulder, no other shoulder tenderness bilaterally, full range of motion actively and passively in bilateral shoulders, slight discomfort on active and passive internal rotation and abduction on the left, positive empty can on the left    Neurological: She is alert.  Skin: Skin is warm and dry. She is not diaphoretic.     Assessment/Plan: Please see individual problem list.  GERD (gastroesophageal reflux disease) Adequately controlled.  Continue current regimen.  Request EGD records.  Hypothyroidism Check TSH.  Continue Synthroid.  Obesity Continue diet and exercise.  Rotator cuff impingement syndrome of left shoulder Suspect rotator cuff impingement.  Discussed exercises.  She will no longer carry her bag over her shoulder.  She bought a rolling bag today.  If not improving could consider physical therapy or injection.   Orders Placed This Encounter  Procedures  . TSH  . HgB A1c    No orders of the defined types were placed in this encounter.    [name], MD [place]

## 2018-06-21 NOTE — Assessment & Plan Note (Signed)
Continue diet and exercise. 

## 2018-06-21 NOTE — Patient Instructions (Addendum)
Nice to see you. We will check lab work today. We will get lab work today. Please do the exercises for your left shoulder and if it does not improve please let us know.   Shoulder Impingement Syndrome Rehab Ask your health care provider which exercises are safe for you. Do exercises exactly as told by your health care provider and adjust them as directed. It is normal to feel mild stretching, pulling, tightness, or discomfort as you do these exercises, but you should stop right away if you feel sudden pain or your pain gets worse.Do not begin these exercises until told by your health care provider. Stretching and range of motion exercise This exercise warms up your muscles and joints and improves the movement and flexibility of your shoulder. This exercise also helps to relieve pain and stiffness. Exercise A: Passive horizontal adduction  1. Sit or stand and pull your left / right elbow across your chest, toward your other shoulder. Stop when you feel a gentle stretch in the back of your shoulder and upper arm. ? Keep your arm at shoulder height. ? Keep your arm as close to your body as you comfortably can. 2. Hold for __________ seconds. 3. Slowly return to the starting position. Repeat __________ times. Complete this exercise __________ times a day. Strengthening exercises These exercises build strength and endurance in your shoulder. Endurance is the ability to use your muscles for a long time, even after they get tired. Exercise B: External rotation, isometric 1. Stand or sit in a doorway, facing the door frame. 2. Bend your left / right elbow and place the back of your wrist against the door frame. Only your wrist should be touching the frame. Keep your upper arm at your side. 3. Gently press your wrist against the door frame, as if you are trying to push your arm away from your abdomen. ? Avoid shrugging your shoulder while you press your hand against the door frame. Keep your shoulder  blade tucked down toward the middle of your back. 4. Hold for __________ seconds. 5. Slowly release the tension, and relax your muscles completely before you do the exercise again. Repeat __________ times. Complete this exercise __________ times a day. Exercise C: Internal rotation, isometric  1. Stand or sit in a doorway, facing the door frame. 2. Bend your left / right elbow and place the inside of your wrist against the door frame. Only your wrist should be touching the frame. Keep your upper arm at your side. 3. Gently press your wrist against the door frame, as if you are trying to push your arm toward your abdomen. ? Avoid shrugging your shoulder while you press your hand against the door frame. Keep your shoulder blade tucked down toward the middle of your back. 4. Hold for __________ seconds. 5. Slowly release the tension, and relax your muscles completely before you do the exercise again. Repeat __________ times. Complete this exercise __________ times a day. Exercise D: Scapular protraction, supine  1. Lie on your back on a firm surface. Hold a __________ weight in your left / right hand. 2. Raise your left / right arm straight into the air so your hand is directly above your shoulder joint. 3. Push the weight into the air so your shoulder lifts off of the surface that you are lying on. Do not move your head, neck, or back. 4. Hold for __________ seconds. 5. Slowly return to the starting position. Let your muscles relax completely before you  repeat this exercise. Repeat __________ times. Complete this exercise __________ times a day. Exercise E: Scapular retraction  1. Sit in a stable chair without armrests, or stand. 2. Secure an exercise band to a stable object in front of you so the band is at shoulder height. 3. Hold one end of the exercise band in each hand. Your palms should face down. 4. Squeeze your shoulder blades together and move your elbows slightly behind you. Do not  shrug your shoulders while you do this. 5. Hold for __________ seconds. 6. Slowly return to the starting position. Repeat __________ times. Complete this exercise __________ times a day. Exercise F: Shoulder extension  1. Sit in a stable chair without armrests, or stand. 2. Secure an exercise band to a stable object in front of you where the band is above shoulder height. 3. Hold one end of the exercise band in each hand. 4. Straighten your elbows and lift your hands up to shoulder height. 5. Squeeze your shoulder blades together and pull your hands down to the sides of your thighs. Stop when your hands are straight down by your sides. Do not let your hands go behind your body. 6. Hold for __________ seconds. 7. Slowly return to the starting position. Repeat __________ times. Complete this exercise __________ times a day. This information is not intended to replace advice given to you by your health care provider. Make sure you discuss any questions you have with your health care provider. Document Released: 11/17/2005 Document Revised: 07/24/2016 Document Reviewed: 10/20/2015 Elsevier Interactive Patient Education  [employer].

## 2018-06-29 ENCOUNTER — Ambulatory Visit (INDEPENDENT_AMBULATORY_CARE_PROVIDER_SITE_OTHER): Payer: BLUE CROSS/BLUE SHIELD | Admitting: Psychology

## 2018-06-29 DIAGNOSIS — F4323 Adjustment disorder with mixed anxiety and depressed mood: Secondary | ICD-10-CM | POA: Diagnosis not present

## 2018-07-02 ENCOUNTER — Ambulatory Visit (INDEPENDENT_AMBULATORY_CARE_PROVIDER_SITE_OTHER): Payer: BLUE CROSS/BLUE SHIELD | Admitting: Psychology

## 2018-07-02 DIAGNOSIS — F4323 Adjustment disorder with mixed anxiety and depressed mood: Secondary | ICD-10-CM | POA: Diagnosis not present

## 2018-07-02 DIAGNOSIS — F330 Major depressive disorder, recurrent, mild: Secondary | ICD-10-CM | POA: Diagnosis not present

## 2018-07-19 ENCOUNTER — Ambulatory Visit: Payer: BLUE CROSS/BLUE SHIELD | Admitting: Psychology

## 2018-08-05 ENCOUNTER — Ambulatory Visit (INDEPENDENT_AMBULATORY_CARE_PROVIDER_SITE_OTHER): Payer: BLUE CROSS/BLUE SHIELD | Admitting: Psychology

## 2018-08-05 DIAGNOSIS — F4310 Post-traumatic stress disorder, unspecified: Secondary | ICD-10-CM

## 2018-08-05 DIAGNOSIS — F330 Major depressive disorder, recurrent, mild: Secondary | ICD-10-CM | POA: Diagnosis not present

## 2018-08-05 DIAGNOSIS — F4323 Adjustment disorder with mixed anxiety and depressed mood: Secondary | ICD-10-CM

## 2018-08-11 ENCOUNTER — Ambulatory Visit (INDEPENDENT_AMBULATORY_CARE_PROVIDER_SITE_OTHER): Payer: BLUE CROSS/BLUE SHIELD | Admitting: Psychology

## 2018-08-11 DIAGNOSIS — F4310 Post-traumatic stress disorder, unspecified: Secondary | ICD-10-CM | POA: Diagnosis not present

## 2018-08-11 DIAGNOSIS — F330 Major depressive disorder, recurrent, mild: Secondary | ICD-10-CM

## 2018-08-11 DIAGNOSIS — F4323 Adjustment disorder with mixed anxiety and depressed mood: Secondary | ICD-10-CM | POA: Diagnosis not present

## 2018-08-18 ENCOUNTER — Ambulatory Visit: Payer: BLUE CROSS/BLUE SHIELD | Admitting: Psychology

## 2018-08-19 ENCOUNTER — Ambulatory Visit: Payer: BLUE CROSS/BLUE SHIELD | Admitting: Psychology

## 2018-08-19 ENCOUNTER — Ambulatory Visit: Payer: BLUE CROSS/BLUE SHIELD

## 2018-08-19 DIAGNOSIS — F4310 Post-traumatic stress disorder, unspecified: Secondary | ICD-10-CM

## 2018-08-21 ENCOUNTER — Other Ambulatory Visit: Payer: Self-pay | Admitting: Family Medicine

## 2018-08-24 ENCOUNTER — Other Ambulatory Visit: Payer: Self-pay | Admitting: Family Medicine

## 2018-08-24 NOTE — Telephone Encounter (Signed)
Last OV 06/21/2018   Last refilled 03/03/2018 disp 90 with 1 refill   Sent to PCP to advise

## 2018-08-25 ENCOUNTER — Ambulatory Visit (INDEPENDENT_AMBULATORY_CARE_PROVIDER_SITE_OTHER): Payer: BLUE CROSS/BLUE SHIELD | Admitting: Psychology

## 2018-08-25 DIAGNOSIS — F4310 Post-traumatic stress disorder, unspecified: Secondary | ICD-10-CM | POA: Diagnosis not present

## 2018-08-25 DIAGNOSIS — F330 Major depressive disorder, recurrent, mild: Secondary | ICD-10-CM | POA: Diagnosis not present

## 2018-09-01 ENCOUNTER — Ambulatory Visit: Payer: BLUE CROSS/BLUE SHIELD | Admitting: Psychology

## 2018-09-15 ENCOUNTER — Ambulatory Visit (INDEPENDENT_AMBULATORY_CARE_PROVIDER_SITE_OTHER): Payer: BLUE CROSS/BLUE SHIELD | Admitting: Psychology

## 2018-09-15 DIAGNOSIS — F4323 Adjustment disorder with mixed anxiety and depressed mood: Secondary | ICD-10-CM | POA: Diagnosis not present

## 2018-09-15 DIAGNOSIS — F331 Major depressive disorder, recurrent, moderate: Secondary | ICD-10-CM

## 2018-09-15 DIAGNOSIS — F4310 Post-traumatic stress disorder, unspecified: Secondary | ICD-10-CM

## 2018-09-22 ENCOUNTER — Ambulatory Visit (INDEPENDENT_AMBULATORY_CARE_PROVIDER_SITE_OTHER): Payer: BLUE CROSS/BLUE SHIELD | Admitting: Psychology

## 2018-09-22 DIAGNOSIS — F4310 Post-traumatic stress disorder, unspecified: Secondary | ICD-10-CM | POA: Diagnosis not present

## 2018-09-22 DIAGNOSIS — F330 Major depressive disorder, recurrent, mild: Secondary | ICD-10-CM | POA: Diagnosis not present

## 2018-09-29 ENCOUNTER — Ambulatory Visit: Payer: BLUE CROSS/BLUE SHIELD | Admitting: Psychology

## 2018-10-05 ENCOUNTER — Ambulatory Visit (INDEPENDENT_AMBULATORY_CARE_PROVIDER_SITE_OTHER): Payer: BLUE CROSS/BLUE SHIELD | Admitting: Psychology

## 2018-10-05 DIAGNOSIS — F4310 Post-traumatic stress disorder, unspecified: Secondary | ICD-10-CM

## 2018-10-05 DIAGNOSIS — F330 Major depressive disorder, recurrent, mild: Secondary | ICD-10-CM | POA: Diagnosis not present

## 2018-10-06 ENCOUNTER — Ambulatory Visit: Payer: BLUE CROSS/BLUE SHIELD | Admitting: Psychology

## 2018-10-11 DIAGNOSIS — M7542 Impingement syndrome of left shoulder: Secondary | ICD-10-CM | POA: Insufficient documentation

## 2018-10-13 ENCOUNTER — Ambulatory Visit (INDEPENDENT_AMBULATORY_CARE_PROVIDER_SITE_OTHER): Payer: BLUE CROSS/BLUE SHIELD | Admitting: Psychology

## 2018-10-13 DIAGNOSIS — F4310 Post-traumatic stress disorder, unspecified: Secondary | ICD-10-CM | POA: Diagnosis not present

## 2018-10-13 DIAGNOSIS — F330 Major depressive disorder, recurrent, mild: Secondary | ICD-10-CM | POA: Diagnosis not present

## 2018-10-19 ENCOUNTER — Ambulatory Visit (INDEPENDENT_AMBULATORY_CARE_PROVIDER_SITE_OTHER): Payer: BLUE CROSS/BLUE SHIELD | Admitting: Psychology

## 2018-10-19 DIAGNOSIS — F4323 Adjustment disorder with mixed anxiety and depressed mood: Secondary | ICD-10-CM | POA: Diagnosis not present

## 2018-10-20 ENCOUNTER — Ambulatory Visit: Payer: BLUE CROSS/BLUE SHIELD | Admitting: Psychology

## 2018-10-27 ENCOUNTER — Ambulatory Visit (INDEPENDENT_AMBULATORY_CARE_PROVIDER_SITE_OTHER): Payer: BLUE CROSS/BLUE SHIELD | Admitting: Psychology

## 2018-10-27 DIAGNOSIS — F4310 Post-traumatic stress disorder, unspecified: Secondary | ICD-10-CM

## 2018-10-27 DIAGNOSIS — F330 Major depressive disorder, recurrent, mild: Secondary | ICD-10-CM

## 2018-11-03 ENCOUNTER — Ambulatory Visit (INDEPENDENT_AMBULATORY_CARE_PROVIDER_SITE_OTHER): Payer: BLUE CROSS/BLUE SHIELD | Admitting: Psychology

## 2018-11-03 ENCOUNTER — Ambulatory Visit: Payer: BLUE CROSS/BLUE SHIELD | Admitting: Psychology

## 2018-11-03 DIAGNOSIS — F4310 Post-traumatic stress disorder, unspecified: Secondary | ICD-10-CM | POA: Diagnosis not present

## 2018-11-03 DIAGNOSIS — F330 Major depressive disorder, recurrent, mild: Secondary | ICD-10-CM | POA: Diagnosis not present

## 2018-11-05 DIAGNOSIS — M7542 Impingement syndrome of left shoulder: Secondary | ICD-10-CM | POA: Diagnosis not present

## 2018-11-08 DIAGNOSIS — G379 Demyelinating disease of central nervous system, unspecified: Secondary | ICD-10-CM | POA: Diagnosis not present

## 2018-11-10 ENCOUNTER — Ambulatory Visit (INDEPENDENT_AMBULATORY_CARE_PROVIDER_SITE_OTHER): Payer: BLUE CROSS/BLUE SHIELD | Admitting: Psychology

## 2018-11-10 DIAGNOSIS — F4310 Post-traumatic stress disorder, unspecified: Secondary | ICD-10-CM | POA: Diagnosis not present

## 2018-11-10 DIAGNOSIS — F330 Major depressive disorder, recurrent, mild: Secondary | ICD-10-CM

## 2018-11-10 DIAGNOSIS — F4323 Adjustment disorder with mixed anxiety and depressed mood: Secondary | ICD-10-CM | POA: Diagnosis not present

## 2018-11-17 DIAGNOSIS — Z6835 Body mass index (BMI) 35.0-35.9, adult: Secondary | ICD-10-CM | POA: Diagnosis not present

## 2018-11-17 DIAGNOSIS — Q283 Other malformations of cerebral vessels: Secondary | ICD-10-CM | POA: Diagnosis not present

## 2018-11-30 ENCOUNTER — Ambulatory Visit: Payer: BLUE CROSS/BLUE SHIELD | Admitting: Psychology

## 2018-12-02 DIAGNOSIS — J069 Acute upper respiratory infection, unspecified: Secondary | ICD-10-CM | POA: Diagnosis not present

## 2018-12-03 DIAGNOSIS — M7542 Impingement syndrome of left shoulder: Secondary | ICD-10-CM | POA: Diagnosis not present

## 2018-12-08 ENCOUNTER — Ambulatory Visit: Payer: BLUE CROSS/BLUE SHIELD | Admitting: Psychology

## 2018-12-08 DIAGNOSIS — F4310 Post-traumatic stress disorder, unspecified: Secondary | ICD-10-CM | POA: Diagnosis not present

## 2018-12-08 DIAGNOSIS — F330 Major depressive disorder, recurrent, mild: Secondary | ICD-10-CM

## 2018-12-15 ENCOUNTER — Ambulatory Visit: Payer: BLUE CROSS/BLUE SHIELD | Admitting: Psychology

## 2018-12-22 ENCOUNTER — Encounter: Payer: Self-pay | Admitting: Family Medicine

## 2018-12-22 ENCOUNTER — Ambulatory Visit: Payer: BLUE CROSS/BLUE SHIELD | Admitting: Psychology

## 2018-12-22 ENCOUNTER — Encounter: Payer: Self-pay | Admitting: *Deleted

## 2018-12-22 ENCOUNTER — Ambulatory Visit: Payer: BLUE CROSS/BLUE SHIELD | Admitting: Family Medicine

## 2018-12-22 VITALS — BP 108/70 | HR 56 | Temp 98.4°F | Ht 66.0 in | Wt 215.4 lb

## 2018-12-22 DIAGNOSIS — R7303 Prediabetes: Secondary | ICD-10-CM

## 2018-12-22 DIAGNOSIS — F4310 Post-traumatic stress disorder, unspecified: Secondary | ICD-10-CM

## 2018-12-22 DIAGNOSIS — F4323 Adjustment disorder with mixed anxiety and depressed mood: Secondary | ICD-10-CM | POA: Diagnosis not present

## 2018-12-22 DIAGNOSIS — R195 Other fecal abnormalities: Secondary | ICD-10-CM | POA: Insufficient documentation

## 2018-12-22 DIAGNOSIS — E559 Vitamin D deficiency, unspecified: Secondary | ICD-10-CM | POA: Diagnosis not present

## 2018-12-22 DIAGNOSIS — K219 Gastro-esophageal reflux disease without esophagitis: Secondary | ICD-10-CM

## 2018-12-22 DIAGNOSIS — E039 Hypothyroidism, unspecified: Secondary | ICD-10-CM | POA: Diagnosis not present

## 2018-12-22 DIAGNOSIS — G378 Other specified demyelinating diseases of central nervous system: Secondary | ICD-10-CM | POA: Insufficient documentation

## 2018-12-22 DIAGNOSIS — M7542 Impingement syndrome of left shoulder: Secondary | ICD-10-CM | POA: Diagnosis not present

## 2018-12-22 DIAGNOSIS — F439 Reaction to severe stress, unspecified: Secondary | ICD-10-CM

## 2018-12-22 DIAGNOSIS — G379 Demyelinating disease of central nervous system, unspecified: Secondary | ICD-10-CM

## 2018-12-22 LAB — COMPREHENSIVE METABOLIC PANEL
ALK PHOS: 53 U/L (ref 39–117)
ALT: 27 U/L (ref 0–35)
AST: 23 U/L (ref 0–37)
Albumin: 4 g/dL (ref 3.5–5.2)
BILIRUBIN TOTAL: 0.5 mg/dL (ref 0.2–1.2)
BUN: 16 mg/dL (ref 6–23)
CO2: 26 mEq/L (ref 19–32)
Calcium: 8.8 mg/dL (ref 8.4–10.5)
Chloride: 105 mEq/L (ref 96–112)
Creatinine, Ser: 0.66 mg/dL (ref 0.40–1.20)
GFR: 96.61 mL/min (ref >60.00)
GLUCOSE: 102 mg/dL — AB (ref 70–99)
POTASSIUM: 4.3 meq/L (ref 3.5–5.1)
SODIUM: 140 meq/L (ref 135–145)
TOTAL PROTEIN: 6.7 g/dL (ref 6.0–8.3)

## 2018-12-22 LAB — VITAMIN D 25 HYDROXY (VIT D DEFICIENCY, FRACTURES): VITD: 24.32 ng/mL — ABNORMAL LOW (ref 30.00–100.00)

## 2018-12-22 LAB — LIPID PANEL
CHOL/HDL RATIO: 5
Cholesterol: 158 mg/dL (ref 0–200)
HDL: 34 mg/dL — AB (ref >39.00)
LDL Cholesterol: 104 mg/dL — ABNORMAL HIGH (ref 0–99)
NONHDL: 124.06
Triglycerides: 100 mg/dL (ref 0.0–149.0)
VLDL: 20 mg/dL (ref 0.0–40.0)

## 2018-12-22 LAB — HEMOGLOBIN A1C: HEMOGLOBIN A1C: 5.4 % (ref 4.6–6.5)

## 2018-12-22 LAB — TSH: TSH: 0.66 u[IU]/mL (ref 0.35–4.50)

## 2018-12-22 NOTE — Patient Instructions (Signed)
Nice to see you. We will check lab work today and contact you with results. Please monitor the loose stools and if they do not improve in the next several days please let us know. We will have you see GI for your reflux. Please do the physical therapy for your shoulder.  If it is not improving please let us know.

## 2018-12-22 NOTE — Assessment & Plan Note (Signed)
Check vitamin D.  Discussed dietary sources of calcium.

## 2018-12-22 NOTE — Assessment & Plan Note (Signed)
History of prior plaques.  She is following with neurology regarding this.

## 2018-12-22 NOTE — Assessment & Plan Note (Signed)
Check A1c. 

## 2018-12-22 NOTE — Assessment & Plan Note (Signed)
Related to issues obtaining nursing care for her son.  There is a potential resolution in the site.  She is following with a therapist.  She will monitor.

## 2018-12-22 NOTE — Progress Notes (Signed)
[name], MD Phone: [phone]  Stephanie Shields is a 48 y.o. female who presents today for follow-up.  CC: Hypothyroidism, vitamin D deficiency, loose stools, GERD, left shoulder pain, stress  Hypothyroidism: Taking Synthroid.  No skin changes.  No heat or cold intolerance.  Previously diagnosed with Hashimoto's by endocrinology.  Vitamin D deficiency: She stopped her vitamin D.  No longer on calcium supplementation.  Loose stools: This has been going on for about 4 days.  She notes that they are not liquidy.  No increased stool frequency.  No blood.  No dietary changes.  No medication changes.  No odd water sources.  No nausea or vomiting.  No abdominal pain.  Left shoulder pain: She has seen orthopedics.  They injected it which was beneficial.  They recommended PT though she had some issues with starting physical therapy at the orthopedic office and will not go back there.  She is going to get set up with physical therapy.  GERD: She is taking Dexilant.  She does note reflux at times even on the [place] depending on what she eats.  No abdominal pain or blood in her stool.  No dysphagia.  Last EGD was 3 years ago and she reports reflux changes.  She is interested in coming off the [place] though is also interested in seeing GI.  Stress: Patient notes this is related to difficulty with getting nursing care for her son.  She notes this has caused quite a bit of stress.  There is minimal depression.  No SI.  No anxiety.  She is seeing a therapist and that has been beneficial.  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  She has periodic MRIs to follow-up on this.  She does not have MS.  They are monitoring.  Prediabetes: No polyuria or polydipsia.  Social History   Tobacco Use  Smoking Status Never Smoker  Smokeless Tobacco Never Used     ROS see history of present illness  Objective  Physical Exam Vitals:   12/22/18 0910  BP: 108/70  Pulse: (!) 56    Temp: 98.4 F (36.9 C)  SpO2: 94%    BP Readings from Last 3 Encounters:  12/22/18 108/70  06/21/18 120/70  12/18/17 100/62   Wt Readings from Last 3 Encounters:  12/22/18 215 lb 6.4 oz (97.7 kg)  06/21/18 224 lb 3.2 oz (101.7 kg)  12/18/17 220 lb (99.8 kg)    Physical Exam Constitutional:      General: She is not in acute distress.    Appearance: She is not diaphoretic.  Cardiovascular:     Rate and Rhythm: Normal rate and regular rhythm.     Heart sounds: Normal heart sounds.  Pulmonary:     Effort: Pulmonary effort is normal.     Breath sounds: Normal breath sounds.  Abdominal:     General: Bowel sounds are normal. There is no distension.     Palpations: Abdomen is soft.     Tenderness: There is no abdominal tenderness. There is no guarding or rebound.  Skin:    General: Skin is warm and dry.  Neurological:     Mental Status: She is alert.      Assessment/Plan: Please see individual problem list.  Stress Related to issues obtaining nursing care for her son.  There is a potential resolution in the site.  She is following with a therapist.  She will monitor.  Prediabetes Check A1c.  Rotator cuff impingement syndrome of left shoulder  She will complete physical therapy.  Hypothyroidism Check TSH.  Continue current medication.  GERD (gastroesophageal reflux disease) Continue Dexilant.  Refer to GI.  Vitamin D deficiency Check vitamin D.  Discussed dietary sources of calcium.  Loose stools Could be dietary related or stress related.  She will monitor and if not improving over the next several days she will let us know.  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  She is following with neurology regarding this.   Orders Placed This Encounter  Procedures  . Vitamin D (25 hydroxy)  . HgB A1c  . Comp Met (CMET)  . TSH  . Lipid panel  . Ambulatory referral to Gastroenterology    Referral Priority:   Routine    Referral  Type:   Consultation    Referral Reason:   Specialty Services Required    Number of Visits Requested:   1    No orders of the defined types were placed in this encounter.    [name], MD [place]

## 2018-12-22 NOTE — Assessment & Plan Note (Signed)
She will complete physical therapy.

## 2018-12-22 NOTE — Assessment & Plan Note (Signed)
Continue Dexilant.  Refer to GI.

## 2018-12-22 NOTE — Assessment & Plan Note (Signed)
Check TSH.  Continue current medication. 

## 2018-12-22 NOTE — Assessment & Plan Note (Signed)
Could be dietary related or stress related.  She will monitor and if not improving over the next several days she will let us know.

## 2018-12-26 DIAGNOSIS — J029 Acute pharyngitis, unspecified: Secondary | ICD-10-CM | POA: Diagnosis not present

## 2018-12-27 ENCOUNTER — Encounter: Payer: Self-pay | Admitting: Family Medicine

## 2018-12-28 ENCOUNTER — Encounter: Payer: Self-pay | Admitting: Family Medicine

## 2018-12-28 ENCOUNTER — Telehealth: Payer: Self-pay

## 2018-12-28 NOTE — Telephone Encounter (Signed)
Please contact them to offer them an appointment. Thanks.

## 2018-12-28 NOTE — Telephone Encounter (Signed)
It would seem odd that you both would have similar symptoms related to different illnesses. I will have [facility] contact you to be seen in the office so we can try to determine what specifically is going on.     Dr Varney Biles pt and left a VM to call back CRM created and sent to [facility] pool.

## 2018-12-28 NOTE — Telephone Encounter (Signed)
Sent to PCP to advise would you like for them to be scheduled to be seen?

## 2018-12-29 ENCOUNTER — Encounter: Payer: Self-pay | Admitting: Family Medicine

## 2018-12-29 ENCOUNTER — Ambulatory Visit (INDEPENDENT_AMBULATORY_CARE_PROVIDER_SITE_OTHER): Payer: BLUE CROSS/BLUE SHIELD | Admitting: Family Medicine

## 2018-12-29 ENCOUNTER — Ambulatory Visit: Payer: BLUE CROSS/BLUE SHIELD | Admitting: Psychology

## 2018-12-29 DIAGNOSIS — K219 Gastro-esophageal reflux disease without esophagitis: Secondary | ICD-10-CM | POA: Diagnosis not present

## 2018-12-29 DIAGNOSIS — R6889 Other general symptoms and signs: Secondary | ICD-10-CM | POA: Diagnosis not present

## 2018-12-29 MED ORDER — BENZONATATE 200 MG PO CAPS: 200.0000 mg | ORAL_CAPSULE | Freq: Two times a day (BID) | ORAL | 0 refills | Status: DC | PRN

## 2018-12-29 NOTE — Progress Notes (Signed)
[name], MD Phone: [phone]  Stephanie Shields is a 48 y.o. female who presents today for same-day visit.  CC: Sore throat  Patient notes onset of symptoms suddenly on Saturday.  She developed sore throat with nausea, headaches, chills, malaise, fatigue, and some body aches.  She had one episode of vomiting of clear broth.  No abdominal pain or diarrhea.  Her appetite has been somewhat suppressed.  She had nasal and sinus congestion.  She started coughing over the last several days.  She has started to feel little bit better.  She was evaluated at urgent care and prescribed amoxicillin to treat presumptive strep throat though she had a negative rapid strep swab and negative flu test.  She notes she saw some white spots in her posterior throat.  She has had a tonsillectomy.  Her husband had similar symptoms and was diagnosed with the flu and treated with Xofluza.  He is feeling better as well.  Patient additionally notes that her insurance is requiring a prior authorization for her [place].  She tried a PPI 10+ years ago though she does not remember the name.  Social History   Tobacco Use  Smoking Status Never Smoker  Smokeless Tobacco Never Used     ROS see history of present illness  Objective  Physical Exam Vitals:   12/29/18 0910  BP: 118/80  Pulse: (!) 57  Temp: 98.2 F (36.8 C)  SpO2: 97%    BP Readings from Last 3 Encounters:  12/29/18 118/80  12/22/18 108/70  06/21/18 120/70   Wt Readings from Last 3 Encounters:  12/29/18 211 lb 3.2 oz (95.8 kg)  12/22/18 215 lb 6.4 oz (97.7 kg)  06/21/18 224 lb 3.2 oz (101.7 kg)    Physical Exam Constitutional:      General: She is not in acute distress.    Appearance: She is not diaphoretic.  HENT:     Head: Normocephalic and atraumatic.     Right Ear: Tympanic membrane normal.     Left Ear: Tympanic membrane normal.     Mouth/Throat:     Mouth: Mucous membranes are moist.     Pharynx: Posterior oropharyngeal  erythema present. No oropharyngeal exudate.  Eyes:     Conjunctiva/sclera: Conjunctivae normal.     Pupils: Pupils are equal, round, and reactive to light.  Cardiovascular:     Rate and Rhythm: Normal rate and regular rhythm.     Heart sounds: Normal heart sounds.  Pulmonary:     Effort: Pulmonary effort is normal.     Breath sounds: Normal breath sounds.  Lymphadenopathy:     Cervical: No cervical adenopathy.  Skin:    General: Skin is warm and dry.  Neurological:     Mental Status: She is alert.      Assessment/Plan: Please see individual problem list.  Flu-like symptoms Symptoms are most consistent with influenza.  Based on her description of symptoms her Centor criteria score was a 2 and she had a negative rapid strep test.  I have advised her to discontinue the amoxicillin as I do not believe she has strep throat.  She will monitor her symptoms and if they worsen coming off of the amoxicillin she can resume it and let us know.  I discussed at this point treatment for influenza would not be beneficial.  Tessalon for cough.  I encouraged her to contact her children's pediatrician to let them know that her and her husband have presumptively been diagnosed with influenza.  GERD (gastroesophageal reflux disease) CMA has contacted the patient's pharmacy and we are awaiting a prior authorization form.  I discussed with the patient that if she needs she can take over-the-counter omeprazole in place of her Dexilant until we have a decision on its approval.    No orders of the defined types were placed in this encounter.   Meds ordered this encounter  Medications  . benzonatate (TESSALON) 200 MG capsule    Sig: Take 1 capsule (200 mg total) by mouth 2 (two) times daily as needed for cough.    Dispense:  20 capsule    Refill:  0     [name], MD [place]

## 2018-12-29 NOTE — Assessment & Plan Note (Signed)
Symptoms are most consistent with influenza.  Based on her description of symptoms her Centor criteria score was a 2 and she had a negative rapid strep test.  I have advised her to discontinue the amoxicillin as I do not believe she has strep throat.  She will monitor her symptoms and if they worsen coming off of the amoxicillin she can resume it and let us know.  I discussed at this point treatment for influenza would not be beneficial.  Tessalon for cough.  I encouraged her to contact her children's pediatrician to let them know that her and her husband have presumptively been diagnosed with influenza.

## 2018-12-29 NOTE — Patient Instructions (Addendum)
Nice to see you. You can discontinue the amoxicillin.  Please monitor your symptoms and if they worsen with discontinuing the antibiotic please restart the amoxicillin and please let us know. Please take the Tessalon for cough. We will work on your prior authorization.  If you do not hear about this by early next week please let us know.  You can try over-the-counter omeprazole if needed.

## 2018-12-29 NOTE — Assessment & Plan Note (Signed)
CMA has contacted the patient's pharmacy and we are awaiting a prior authorization form.  I discussed with the patient that if she needs she can take over-the-counter omeprazole in place of her Dexilant until we have a decision on its approval.

## 2018-12-29 NOTE — Telephone Encounter (Signed)
Pt was seen today for an appt.

## 2019-01-05 ENCOUNTER — Ambulatory Visit: Payer: BLUE CROSS/BLUE SHIELD | Admitting: Psychology

## 2019-01-07 ENCOUNTER — Telehealth: Payer: Self-pay | Admitting: Family Medicine

## 2019-01-07 DIAGNOSIS — R05 Cough: Secondary | ICD-10-CM

## 2019-01-07 DIAGNOSIS — R059 Cough, unspecified: Secondary | ICD-10-CM

## 2019-01-07 MED ORDER — BENZONATATE 200 MG PO CAPS
200.0000 mg | ORAL_CAPSULE | Freq: Two times a day (BID) | ORAL | 0 refills | Status: DC | PRN
Start: 2019-01-07 — End: 2019-07-03

## 2019-01-07 NOTE — Telephone Encounter (Signed)
Copied from [place] [id number]. Topic: Quick Communication - Rx Refill/Question >> Jan 07, 2019 10:32 AM Alanda Slim E wrote: Medication: DEXILANT 60 MG capsule - PA needed, PT has been without this medication and waiting on a PA for over a week/ please advise   -Needs refill on benzonatate (TESSALON) 200 MG capsule  Has the patient contacted their pharmacy? yes   Preferred Pharmacy (with phone number or street name): [id number] [address], [place] [phone] (Phone) [phone] (Fax)    Agent: Please be advised that RX refills may take up to 3 business days. We ask that you follow-up with your pharmacy.

## 2019-01-07 NOTE — Telephone Encounter (Signed)
Refill sent in.  If cough not improving, she should come in to be seen again next week

## 2019-01-10 ENCOUNTER — Ambulatory Visit: Payer: BLUE CROSS/BLUE SHIELD | Admitting: Family Medicine

## 2019-01-10 ENCOUNTER — Encounter: Payer: Self-pay | Admitting: Family Medicine

## 2019-01-10 ENCOUNTER — Ambulatory Visit: Payer: Self-pay | Admitting: *Deleted

## 2019-01-10 ENCOUNTER — Ambulatory Visit (INDEPENDENT_AMBULATORY_CARE_PROVIDER_SITE_OTHER)
Admission: RE | Admit: 2019-01-10 | Discharge: 2019-01-10 | Disposition: A | Discharge status: Home / Self Care | Payer: BLUE CROSS/BLUE SHIELD | Source: Ambulatory Visit | Attending: Family Medicine | Admitting: Family Medicine

## 2019-01-10 VITALS — BP 108/62 | HR 65 | Temp 98.8°F | Resp 12 | Ht 68.0 in | Wt 213.2 lb

## 2019-01-10 DIAGNOSIS — J181 Lobar pneumonia, unspecified organism: Secondary | ICD-10-CM | POA: Diagnosis not present

## 2019-01-10 DIAGNOSIS — R059 Cough, unspecified: Secondary | ICD-10-CM

## 2019-01-10 DIAGNOSIS — R05 Cough: Secondary | ICD-10-CM

## 2019-01-10 DIAGNOSIS — J189 Pneumonia, unspecified organism: Secondary | ICD-10-CM

## 2019-01-10 MED ORDER — AMOXICILLIN 875 MG PO TABS: 875.0000 mg | ORAL_TABLET | Freq: Two times a day (BID) | ORAL | 0 refills | Status: AC

## 2019-01-10 MED ORDER — AZITHROMYCIN 250 MG PO TABS: ORAL_TABLET | ORAL | 0 refills | Status: DC

## 2019-01-10 NOTE — Telephone Encounter (Signed)
Patient says she has an appointment with her son at 3:30 and she has to keep that appointment is why she requested appointment earlier.

## 2019-01-10 NOTE — Telephone Encounter (Signed)
FYI

## 2019-01-10 NOTE — Telephone Encounter (Signed)
Please call the patient to see if she can come in to see me at 3:15 today for her symptoms. Thanks.

## 2019-01-10 NOTE — Telephone Encounter (Signed)
Sent to PCP to advise if OV is need or if you want pt to come in to do a chest X-ray for possible pneumonia or bronchitis.

## 2019-01-10 NOTE — Patient Instructions (Signed)
Chest X-ray with signs of pneumonia  Antibiotics prescribed. Could wait for final read or start now. Final read should be back by tomorrow   Cough 1) Cough drops can be helpful 2) Nyquil (or nighttime cough medication) 3) Honey is proven to be one of the best cough medications

## 2019-01-10 NOTE — Telephone Encounter (Signed)
Noted. I will forward to Dr Einar Pheasant as an Juluis Rainier.

## 2019-01-10 NOTE — Telephone Encounter (Signed)
She should be seen again. It appears that she has an appointment at [facility] at 2 pm. If the patient would prefer I could see her in the office today at 3:15. Thanks.

## 2019-01-10 NOTE — Telephone Encounter (Signed)
Pt diagnosed with the flu 12/29/2018. States cough remains; productive for minimal amounts of greenish phlegm.  Reports "Some chest tightness and wheezing at times." Reports she has used her son's albuterol inhaler which "helped somewhat." States prescribed Benzonatate which lessened cough initially, but "Has worsened past 1-1/2 weeks. Denies fever. No availability with Dr. Caryl Bis or other provider at [facility]. [place] spoke with Shirlean Mylar at [facility], appt secured with Dr. Einar Pheasant for today. Care advise given per protocol.   Reason for Disposition . [1] Continuous (nonstop) coughing interferes with work or school AND [2] no improvement using cough treatment per Care Advice  Answer Assessment - Initial Assessment Questions 1. ONSET: "When did the cough begin?"      1- 1/2 weeks ago 2. SEVERITY: "How bad is the cough today?"      Severe 3. RESPIRATORY DISTRESS: "Describe your breathing."     Chest tightness and SOB at times "Occasionally"  4. FEVER: "Do you have a fever?" If so, ask: "What is your temperature, how was it measured, and when did it start?"     no 5. SPUTUM: "Describe the color of your sputum" (clear, white, yellow, green)    Green at times, small amount 6. HEMOPTYSIS: "Are you coughing up any blood?" If so ask: "How much?" (flecks, streaks, tablespoons, etc.)     no 7. CARDIAC HISTORY: "Do you have any history of heart disease?" (e.g., heart attack, congestive heart failure)     no 8. LUNG HISTORY: "Do you have any history of lung disease?"  (e.g., pulmonary embolus, asthma, emphysema)    No. "Allergies" 9. PE RISK FACTORS: "Do you have a history of blood clots?" (or: recent major surgery, recent prolonged travel, bedridden)    no 10. OTHER SYMPTOMS: "Do you have any other symptoms?" (e.g., runny nose, wheezing, chest pain)      Wheezing, intermittent  Protocols used: COUGH - ACUTE PRODUCTIVE-A-AH

## 2019-01-10 NOTE — Progress Notes (Signed)
Subjective:     Stephanie Shields is a 48 y.o. female presenting for Cough (pt was seen on 12/29/2018 for flu like symptoms. Patient is feeling better with flu like symptoms but cough is lingering. Has taking benzonatate for cough. Feels chest congestion/tightness. )     Cough  This is a new problem. The current episode started 1 to 4 weeks ago. The problem has been gradually worsening. The cough is productive of sputum. Associated symptoms include chest pain, headaches, heartburn, postnasal drip, shortness of breath and wheezing. Pertinent negatives include no chills, fever or myalgias. Nothing aggravates the symptoms. She has tried prescription cough suppressant (cough drops) for the symptoms. The treatment provided mild relief. Her past medical history is significant for environmental allergies. There is no history of asthma.    Initially started on amoxicillin for strept but no improvement and diagnosed with flu but too late for treatment  Flu symptoms seem to have improved  Benzonatate not as helpful this time around  Has been out of heartburn medication x 1 week   Review of Systems  Constitutional: Positive for fatigue. Negative for chills and fever.  HENT: Positive for congestion and postnasal drip. Negative for sinus pressure and sinus pain.   Respiratory: Positive for cough, shortness of breath and wheezing.   Cardiovascular: Positive for chest pain.  Gastrointestinal: Positive for heartburn.  Musculoskeletal: Negative for myalgias.  Allergic/Immunologic: Positive for environmental allergies.  Neurological: Positive for headaches.    Social History   Tobacco Use  Smoking Status Never Smoker  Smokeless Tobacco Never Used        Objective:    BP Readings from Last 3 Encounters:  01/10/19 108/62  12/29/18 118/80  12/22/18 108/70   Wt Readings from Last 3 Encounters:  01/10/19 213 lb 4 oz (96.7 kg)  12/29/18 211 lb 3.2 oz (95.8 kg)  12/22/18 215 lb 6.4 oz (97.7  kg)    BP 108/62   Pulse 65   Temp 98.8 F (37.1 C)   Resp 12   Ht 5\' 8"  (1.727 m)   Wt 213 lb 4 oz (96.7 kg)   SpO2 96%   BMI 32.42 kg/m    Physical Exam Constitutional:      General: She is not in acute distress.    Appearance: She is well-developed. She is not diaphoretic.  HENT:     Head: Normocephalic and atraumatic.     Right Ear: Tympanic membrane and ear canal normal.     Left Ear: Tympanic membrane and ear canal normal.     Nose: Mucosal edema and rhinorrhea present.     Right Sinus: Maxillary sinus tenderness present. No frontal sinus tenderness.     Left Sinus: Maxillary sinus tenderness present. No frontal sinus tenderness.     Mouth/Throat:     Pharynx: Uvula midline. Posterior oropharyngeal erythema present. No oropharyngeal exudate.     Tonsils: Swelling: 0 on the right. 0 on the left.  Eyes:     General: No scleral icterus.    Conjunctiva/sclera: Conjunctivae normal.  Neck:     Musculoskeletal: Neck supple.  Cardiovascular:     Rate and Rhythm: Normal rate and regular rhythm.     Heart sounds: Normal heart sounds. No murmur.  Pulmonary:     Effort: Pulmonary effort is normal. No respiratory distress.     Breath sounds: Examination of the right-middle field reveals wheezing and rhonchi. Examination of the right-lower field reveals wheezing and rhonchi. Decreased breath sounds, wheezing  and rhonchi present.  Lymphadenopathy:     Cervical: No cervical adenopathy.  Skin:    General: Skin is warm and dry.     Capillary Refill: Capillary refill takes less than 2 seconds.  Neurological:     Mental Status: She is alert.      CXR: small consolidation along the right middle/lower lobe     Assessment & Plan:   Problem List Items Addressed This Visit    None    Visit Diagnoses    Pneumonia of right lower lobe due to infectious organism ([place])    -  Primary   Relevant Medications   amoxicillin (AMOXIL) 875 MG tablet   azithromycin (ZITHROMAX) 250 MG  tablet   Cough       Relevant Orders   DG Chest 2 View     Will follow-up final read, though initial read concerning for pneumonia. Abx prescribed   Cont symptomatic care   Return if symptoms worsen or fail to improve.  Lesleigh Noe, MD

## 2019-01-10 NOTE — Telephone Encounter (Signed)
Would highly recommend PCP follow-up as already seen for issue and known patient. Seems PCP has availability today.

## 2019-01-12 ENCOUNTER — Ambulatory Visit: Payer: BLUE CROSS/BLUE SHIELD | Admitting: Psychology

## 2019-01-13 NOTE — Telephone Encounter (Signed)
PA completed and the PA was REJECTED for the Dexilant 60 MG   Rx is rejected for reimbursement at this time. Further action may result in the medication being covered. If clinically appropriate change the prescription.   Called pt and left a VM that PA was done and was rejected advised for pt to call me back. Sent to PCP as an [place].   CRM created and sent to [facility] pool.

## 2019-01-13 NOTE — Telephone Encounter (Signed)
Noted.  Please determine how she would like to proceed.  We could trial an alternative medication if needed.  Please also find out what further action is needed that could result in the medication being covered.  Thanks.

## 2019-01-14 NOTE — Telephone Encounter (Signed)
Called pt and left a VM to call back. CRM created and sent to [facility] pool. 

## 2019-01-14 NOTE — Telephone Encounter (Signed)
Patient would prefer to continue medication she has been taking for years if possible.  Patient is out- she has been taking Prilosec over the counter and has had some relief. But would be interested to know what would be prescribed if she tried something different.

## 2019-01-14 NOTE — Telephone Encounter (Signed)
Sent to PCP to advise 

## 2019-01-15 NOTE — Telephone Encounter (Signed)
Please find out what further action needs to be taken to try to get the dexilant approved. I would prefer if she could continue this though need to know if we need to try anything else for it to be approved. Thanks.

## 2019-01-17 NOTE — Telephone Encounter (Signed)
Juliann Pulse could you help me with this PA?

## 2019-01-18 NOTE — Telephone Encounter (Signed)
Called and spoke with pt. Pt advised that she has been approved.

## 2019-01-18 NOTE — Telephone Encounter (Signed)
Stephanie Shields Key: [id number] Need help? Call us at [phone]  Outcome  Approved today  Effective from 01/18/2019 through 01/16/2022.  Please notify patient.

## 2019-01-19 ENCOUNTER — Ambulatory Visit: Payer: BLUE CROSS/BLUE SHIELD | Admitting: Psychology

## 2019-01-24 ENCOUNTER — Other Ambulatory Visit: Payer: Self-pay

## 2019-01-24 ENCOUNTER — Ambulatory Visit: Payer: BLUE CROSS/BLUE SHIELD | Admitting: Gastroenterology

## 2019-01-24 ENCOUNTER — Encounter: Payer: Self-pay | Admitting: Gastroenterology

## 2019-01-24 VITALS — BP 102/62 | HR 60 | Resp 17 | Ht 68.0 in | Wt 216.2 lb

## 2019-01-24 DIAGNOSIS — K2100 Gastro-esophageal reflux disease with esophagitis, without bleeding: Secondary | ICD-10-CM

## 2019-01-24 NOTE — Progress Notes (Signed)
Stephanie Darby, MD [address]  [place]  [place], [place] [address]  Main: [phone]  Fax: [phone]    Gastroenterology Consultation  Referring Provider:     Leone Haven, MD Primary Care Physician:  Leone Haven, MD Primary Gastroenterologist:  Dr. Cephas Shields Reason for Consultation:     GERD        HPI:   Stephanie Shields is a 48 y.o. female referred by Dr. Caryl Bis, Angela Adam, MD  for consultation & management of chronic GERD, symptoms include heart burn, burping, reflux. Has been taking dexilant 60mg  for several years which provides significant relief.  Currently, on prilosec 20mg  OTC daily while waiting on authorization. She prefers to come off PPI if possible. Denies dysphagia. Trying to lose weight, lost about ~15lbs in last 2months.  She is trying to avoid food triggers such as coffee, chocolate and spicy foods She does not smoke or drink alcohol  NSAIDs: None  Antiplts/Anticoagulants/Anti thrombotics: None  GI Procedures: EGD in 05/2014 revealed esophagitis No esophageal biopsies were performed.  There was no evidence of H. pylori and gastric biopsies.  She denies family history of GI malignancy  Past Medical History:  Diagnosis Date  . Allergic rhinitis   . 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 Elevated cholesterol   . GERD (gastroesophageal reflux disease)   . History of blood transfusion   . Hypothyroidism   . Urinary incontinence    After delivery of her recent baby    Past Surgical History:  Procedure Laterality Date  . TONSILECTOMY, ADENOIDECTOMY, BILATERAL MYRINGOTOMY AND TUBES  1980    Current Outpatient Medications:  .  levonorgestrel (MIRENA) 20 MCG/24HR IUD, 1 each by Intrauterine route once., Disp: , Rfl:  .  levothyroxine (SYNTHROID, LEVOTHROID) 150 MCG tablet, TAKE 1 TABLET (150 MCG TOTAL) BY MOUTH DAILY BEFORE BREAKFAST., Disp: 90 tablet, Rfl: 3 .   Azelastine-Fluticasone 137-50 MCG/ACT SUSP, 1 spray in each nostril twice daily. (Patient not taking: Reported on 01/24/2019), Disp: 23 g, Rfl: 1 .  azithromycin (ZITHROMAX) 250 MG tablet, Take 2 tablets (500 mg) today. Then take 1 tablet daily for the next 4 days. (Patient not taking: Reported on 01/24/2019), Disp: 6 tablet, Rfl: 0 .  benzonatate (TESSALON) 200 MG capsule, Take 1 capsule (200 mg total) by mouth 2 (two) times daily as needed for cough. (Patient not taking: Reported on 01/24/2019), Disp: 20 capsule, Rfl: 0 .  DEXILANT 60 MG capsule, TAKE 1 CAPSULE BY MOUTH EVERY DAY (Patient not taking: Reported on 01/10/2019), Disp: 90 capsule, Rfl: 1  Current Facility-Administered Medications:  .  betamethasone acetate-betamethasone sodium phosphate (CELESTONE) injection 3 mg, 3 mg, Intramuscular, Once, Evans, Brent M, DPM .  betamethasone acetate-betamethasone sodium phosphate (CELESTONE) injection 3 mg, 3 mg, Intramuscular, Once, Edrick Kins, DPM   Family History  Problem Relation Age of Onset  . Arthritis Unknown        Parent, Grandparent  . Prostate cancer Unknown        Grandparent  . Hyperlipidemia Unknown        Parent  . Heart disease Unknown        Grandparent  . Stroke Unknown        Grandparent  . Diabetes Unknown        Parent     Social History   Tobacco Use  . Smoking status: Never Smoker  . Smokeless tobacco:  Never Used  Substance Use Topics  . Alcohol use: Yes    Alcohol/week: 0.0 standard drinks  . Drug use: No    Allergies as of 01/24/2019 - Review Complete 01/24/2019  Allergen Reaction Noted  . Sulfa antibiotics Nausea And Vomiting 04/04/2013    Review of Systems:    All systems reviewed and negative except where noted in HPI.   Physical Exam:  BP 102/62 (BP Location: Left Arm, Patient Position: Sitting, Cuff Size: Large)   Pulse 60   Resp 17   Ht 5\' 8"  (1.727 m)   Wt 216 lb 3.2 oz (98.1 kg)   BMI 32.87 kg/m  No LMP recorded. (Menstrual status:  IUD).  General:   Alert,  Well-developed, well-nourished, pleasant and cooperative in NAD Head:  Normocephalic and atraumatic. Eyes:  Sclera clear, no icterus.   Conjunctiva pink. Ears:  Normal auditory acuity. Nose:  No deformity, discharge, or lesions. Mouth:  No deformity or lesions,oropharynx pink & moist. Neck:  Supple; no masses or thyromegaly. Lungs:  Respirations even and unlabored.  Clear throughout to auscultation.   No wheezes, crackles, or rhonchi. No acute distress. Heart:  Regular rate and rhythm; no murmurs, clicks, rubs, or gallops. Abdomen:  Normal bowel sounds. Soft, non-tender and non-distended without masses, hepatosplenomegaly or hernias noted.  No guarding or rebound tenderness.   Rectal: Not performed Msk:  Symmetrical without gross deformities. Good, equal movement & strength bilaterally. Pulses:  Normal pulses noted. Extremities:  No clubbing or edema.  No cyanosis. Neurologic:  Alert and oriented x3;  grossly normal neurologically. Skin:  Intact without significant lesions or rashes. No jaundice. Psych:  Alert and cooperative. Normal mood and affect.  Imaging Studies: No abdominal imaging  Assessment and Plan:   [name] is a 48 y.o. pleasant Caucasian female with hypothyroidism with chronic GERD, her symptoms are typical for GERD, relieved on PPI, had esophagitis based on EGD in 2015 seen in consultation for chronic GERD  Continue Prilosec daily for now Discussed with her about antireflux measures Recommend EGD with esophageal biopsies, if unremarkable will switch to H2 blocker to see if her symptoms are in remission.  If symptoms recur, will refer her to pH impedance to evaluate for antireflux surgery   Follow up in 4 weeks   Stephanie Darby, MD

## 2019-01-26 ENCOUNTER — Ambulatory Visit: Payer: BLUE CROSS/BLUE SHIELD | Admitting: Psychology

## 2019-02-02 ENCOUNTER — Ambulatory Visit: Payer: BLUE CROSS/BLUE SHIELD | Admitting: Psychology

## 2019-02-09 ENCOUNTER — Ambulatory Visit: Payer: BLUE CROSS/BLUE SHIELD | Admitting: Psychology

## 2019-02-14 ENCOUNTER — Telehealth: Payer: Self-pay | Admitting: Gastroenterology

## 2019-02-14 NOTE — Telephone Encounter (Signed)
Pt left vm to cancel her procedure for  02/17/19 and would like to r/s a later time  Due to the corona virus please call pt

## 2019-02-14 NOTE — Telephone Encounter (Signed)
Patient contacted office to cancel her EGD with Dr. Marius Ditch scheduled for 02/17/19 due to [place] 19.  Endoscopy has been notified.  Thanks [employer]

## 2019-02-16 ENCOUNTER — Ambulatory Visit: Payer: BLUE CROSS/BLUE SHIELD | Admitting: Psychology

## 2019-02-17 ENCOUNTER — Ambulatory Visit
Admission: RE | Admit: 2019-02-17 | Payer: BLUE CROSS/BLUE SHIELD | Source: Home / Self Care | Admitting: Gastroenterology

## 2019-02-17 ENCOUNTER — Encounter: Admission: RE | Payer: Self-pay | Source: Home / Self Care

## 2019-02-17 SURGERY — ESOPHAGOGASTRODUODENOSCOPY (EGD) WITH PROPOFOL
Anesthesia: General

## 2019-02-23 ENCOUNTER — Ambulatory Visit: Payer: BLUE CROSS/BLUE SHIELD | Admitting: Psychology

## 2019-02-24 ENCOUNTER — Ambulatory Visit: Payer: Self-pay | Admitting: Gastroenterology

## 2019-03-02 ENCOUNTER — Ambulatory Visit: Payer: BLUE CROSS/BLUE SHIELD | Admitting: Psychology

## 2019-03-09 ENCOUNTER — Ambulatory Visit: Payer: BLUE CROSS/BLUE SHIELD | Admitting: Psychology

## 2019-03-16 ENCOUNTER — Ambulatory Visit (INDEPENDENT_AMBULATORY_CARE_PROVIDER_SITE_OTHER): Payer: BLUE CROSS/BLUE SHIELD | Admitting: Psychology

## 2019-03-16 DIAGNOSIS — F4310 Post-traumatic stress disorder, unspecified: Secondary | ICD-10-CM | POA: Diagnosis not present

## 2019-03-30 ENCOUNTER — Ambulatory Visit (INDEPENDENT_AMBULATORY_CARE_PROVIDER_SITE_OTHER): Payer: BLUE CROSS/BLUE SHIELD | Admitting: Psychology

## 2019-03-30 DIAGNOSIS — F4310 Post-traumatic stress disorder, unspecified: Secondary | ICD-10-CM

## 2019-04-13 ENCOUNTER — Ambulatory Visit (INDEPENDENT_AMBULATORY_CARE_PROVIDER_SITE_OTHER): Payer: BLUE CROSS/BLUE SHIELD | Admitting: Psychology

## 2019-04-13 DIAGNOSIS — F4310 Post-traumatic stress disorder, unspecified: Secondary | ICD-10-CM

## 2019-04-26 ENCOUNTER — Telehealth: Payer: Self-pay | Admitting: Family Medicine

## 2019-04-26 DIAGNOSIS — M7542 Impingement syndrome of left shoulder: Secondary | ICD-10-CM

## 2019-04-26 NOTE — Telephone Encounter (Signed)
I placed a referral.  She previously saw orthopedics.  Please see if she wants to see the same orthopedic physician.

## 2019-04-26 NOTE — Telephone Encounter (Signed)
Copied from [place] [id number]. Topic: General - Other >> Apr 26, 2019  3:29 PM Keene Breath wrote: Reason for CRM: Patient called to request a referral to an ortho specialist for her shoulder pain.  Patient stated that she has spoken to the doctor about her pain and would like to go ahead with the referral.  CB# [phone]

## 2019-04-27 ENCOUNTER — Ambulatory Visit (INDEPENDENT_AMBULATORY_CARE_PROVIDER_SITE_OTHER): Payer: BLUE CROSS/BLUE SHIELD | Admitting: Psychology

## 2019-04-27 DIAGNOSIS — F4310 Post-traumatic stress disorder, unspecified: Secondary | ICD-10-CM

## 2019-04-27 NOTE — Telephone Encounter (Signed)
I will send to [facility]. Please refer to [facility] orthopedics. Thanks.

## 2019-04-27 NOTE — Telephone Encounter (Signed)
Called and spoke to pt.  Pt does not want to go to [facility] where she previously went in the past.  Pt would like to be referred somewhere else.  Pt ok with PCP choosing orthopedic physician.

## 2019-05-04 DIAGNOSIS — G8929 Other chronic pain: Secondary | ICD-10-CM | POA: Diagnosis not present

## 2019-05-04 DIAGNOSIS — M7582 Other shoulder lesions, left shoulder: Secondary | ICD-10-CM | POA: Diagnosis not present

## 2019-05-04 DIAGNOSIS — M7542 Impingement syndrome of left shoulder: Secondary | ICD-10-CM | POA: Diagnosis not present

## 2019-05-04 DIAGNOSIS — M25512 Pain in left shoulder: Secondary | ICD-10-CM | POA: Diagnosis not present

## 2019-05-06 ENCOUNTER — Other Ambulatory Visit: Payer: Self-pay | Admitting: Student

## 2019-05-06 DIAGNOSIS — M7542 Impingement syndrome of left shoulder: Secondary | ICD-10-CM

## 2019-05-06 DIAGNOSIS — M7582 Other shoulder lesions, left shoulder: Secondary | ICD-10-CM

## 2019-05-09 DIAGNOSIS — D1800 Hemangioma unspecified site: Secondary | ICD-10-CM | POA: Diagnosis not present

## 2019-05-09 DIAGNOSIS — G379 Demyelinating disease of central nervous system, unspecified: Secondary | ICD-10-CM | POA: Diagnosis not present

## 2019-05-11 ENCOUNTER — Ambulatory Visit (INDEPENDENT_AMBULATORY_CARE_PROVIDER_SITE_OTHER): Payer: BC Managed Care – PPO | Admitting: Psychology

## 2019-05-11 DIAGNOSIS — F4310 Post-traumatic stress disorder, unspecified: Secondary | ICD-10-CM

## 2019-05-11 DIAGNOSIS — F330 Major depressive disorder, recurrent, mild: Secondary | ICD-10-CM

## 2019-05-11 DIAGNOSIS — F4323 Adjustment disorder with mixed anxiety and depressed mood: Secondary | ICD-10-CM | POA: Diagnosis not present

## 2019-05-19 ENCOUNTER — Ambulatory Visit
Admission: RE | Admit: 2019-05-19 | Discharge: 2019-05-19 | Disposition: A | Discharge status: Home / Self Care | Payer: BC Managed Care – PPO | Source: Ambulatory Visit | Attending: Student | Admitting: Student

## 2019-05-19 ENCOUNTER — Other Ambulatory Visit: Payer: Self-pay

## 2019-05-19 DIAGNOSIS — M75112 Incomplete rotator cuff tear or rupture of left shoulder, not specified as traumatic: Secondary | ICD-10-CM | POA: Diagnosis not present

## 2019-05-19 DIAGNOSIS — M7542 Impingement syndrome of left shoulder: Secondary | ICD-10-CM

## 2019-05-19 DIAGNOSIS — M7582 Other shoulder lesions, left shoulder: Secondary | ICD-10-CM | POA: Diagnosis not present

## 2019-05-23 DIAGNOSIS — M7582 Other shoulder lesions, left shoulder: Secondary | ICD-10-CM | POA: Diagnosis not present

## 2019-05-23 DIAGNOSIS — M75112 Incomplete rotator cuff tear or rupture of left shoulder, not specified as traumatic: Secondary | ICD-10-CM | POA: Diagnosis not present

## 2019-05-25 ENCOUNTER — Ambulatory Visit (INDEPENDENT_AMBULATORY_CARE_PROVIDER_SITE_OTHER): Payer: BC Managed Care – PPO | Admitting: Psychology

## 2019-05-25 ENCOUNTER — Encounter: Payer: Self-pay | Admitting: Gastroenterology

## 2019-05-25 DIAGNOSIS — F330 Major depressive disorder, recurrent, mild: Secondary | ICD-10-CM | POA: Diagnosis not present

## 2019-05-25 DIAGNOSIS — F4310 Post-traumatic stress disorder, unspecified: Secondary | ICD-10-CM

## 2019-05-25 DIAGNOSIS — F4323 Adjustment disorder with mixed anxiety and depressed mood: Secondary | ICD-10-CM | POA: Diagnosis not present

## 2019-06-08 ENCOUNTER — Ambulatory Visit (INDEPENDENT_AMBULATORY_CARE_PROVIDER_SITE_OTHER): Payer: BC Managed Care – PPO | Admitting: Psychology

## 2019-06-08 DIAGNOSIS — F4323 Adjustment disorder with mixed anxiety and depressed mood: Secondary | ICD-10-CM

## 2019-06-08 DIAGNOSIS — F4310 Post-traumatic stress disorder, unspecified: Secondary | ICD-10-CM

## 2019-06-08 DIAGNOSIS — F330 Major depressive disorder, recurrent, mild: Secondary | ICD-10-CM

## 2019-06-17 DIAGNOSIS — D2261 Melanocytic nevi of right upper limb, including shoulder: Secondary | ICD-10-CM | POA: Diagnosis not present

## 2019-06-17 DIAGNOSIS — D225 Melanocytic nevi of trunk: Secondary | ICD-10-CM | POA: Diagnosis not present

## 2019-06-17 DIAGNOSIS — D2262 Melanocytic nevi of left upper limb, including shoulder: Secondary | ICD-10-CM | POA: Diagnosis not present

## 2019-06-17 DIAGNOSIS — D2272 Melanocytic nevi of left lower limb, including hip: Secondary | ICD-10-CM | POA: Diagnosis not present

## 2019-06-22 ENCOUNTER — Ambulatory Visit: Payer: BLUE CROSS/BLUE SHIELD | Admitting: Psychology

## 2019-06-29 ENCOUNTER — Ambulatory Visit: Payer: BC Managed Care – PPO | Admitting: Family Medicine

## 2019-06-29 ENCOUNTER — Other Ambulatory Visit: Payer: Self-pay

## 2019-06-29 ENCOUNTER — Encounter: Payer: Self-pay | Admitting: Family Medicine

## 2019-06-29 DIAGNOSIS — J309 Allergic rhinitis, unspecified: Secondary | ICD-10-CM

## 2019-06-29 DIAGNOSIS — F419 Anxiety disorder, unspecified: Secondary | ICD-10-CM

## 2019-06-29 DIAGNOSIS — F32A Depression, unspecified: Secondary | ICD-10-CM

## 2019-06-29 DIAGNOSIS — R0681 Apnea, not elsewhere classified: Secondary | ICD-10-CM | POA: Diagnosis not present

## 2019-06-29 DIAGNOSIS — M7542 Impingement syndrome of left shoulder: Secondary | ICD-10-CM | POA: Diagnosis not present

## 2019-06-29 DIAGNOSIS — H93A1 Pulsatile tinnitus, right ear: Secondary | ICD-10-CM

## 2019-06-29 DIAGNOSIS — F329 Major depressive disorder, single episode, unspecified: Secondary | ICD-10-CM

## 2019-06-29 DIAGNOSIS — R195 Other fecal abnormalities: Secondary | ICD-10-CM

## 2019-06-29 NOTE — Patient Instructions (Signed)
Nice to see you.  We will get you scheduled for a sleep study and to see ENT.  If you develop worsening symptoms please be evaluated.  Please let us know the result of your test.

## 2019-07-01 ENCOUNTER — Encounter: Payer: Self-pay | Admitting: Family Medicine

## 2019-07-01 DIAGNOSIS — E039 Hypothyroidism, unspecified: Secondary | ICD-10-CM

## 2019-07-01 DIAGNOSIS — E559 Vitamin D deficiency, unspecified: Secondary | ICD-10-CM

## 2019-07-03 DIAGNOSIS — H93A1 Pulsatile tinnitus, right ear: Secondary | ICD-10-CM | POA: Insufficient documentation

## 2019-07-03 DIAGNOSIS — F419 Anxiety disorder, unspecified: Secondary | ICD-10-CM | POA: Insufficient documentation

## 2019-07-03 DIAGNOSIS — R0681 Apnea, not elsewhere classified: Secondary | ICD-10-CM | POA: Insufficient documentation

## 2019-07-03 DIAGNOSIS — F32A Depression, unspecified: Secondary | ICD-10-CM | POA: Insufficient documentation

## 2019-07-03 DIAGNOSIS — G4733 Obstructive sleep apnea (adult) (pediatric): Secondary | ICD-10-CM | POA: Insufficient documentation

## 2019-07-03 DIAGNOSIS — F329 Major depressive disorder, single episode, unspecified: Secondary | ICD-10-CM | POA: Insufficient documentation

## 2019-07-03 NOTE — Assessment & Plan Note (Signed)
Patient's upper respiratory symptoms are most likely related to allergic rhinitis.  She will trial over-the-counter second-generation antihistamine.  Monitor for any worsening of symptoms.  She will inform us when her COVID-19 test returns.

## 2019-07-03 NOTE — Assessment & Plan Note (Signed)
Likely related to her menstrual cycle given time course. She will monitor given resolution.

## 2019-07-03 NOTE — Assessment & Plan Note (Signed)
She will continue to see her therapist.

## 2019-07-03 NOTE — Assessment & Plan Note (Signed)
Home sleep study ordered

## 2019-07-03 NOTE — Progress Notes (Signed)
[name], MD Phone: [phone]  Stephanie Shields is a 48 y.o. female who presents today for follow-up.  Depression/anxiety: Patient notes this is related to the COVID-19 pandemic and her job.  She has been seeing a therapist and this has been progressively improving.  No SI.  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  She notes her sleep schedule has been off and that has also been contributing to her sleep issues.  She notes her husband has advised her that she stops breathing for periods of time at night.  She does not wake up well rested.  Left shoulder pain: She is scheduled for surgery next month.  She has a bone spur and a partial thickness tear of her rotator cuff.  Right ear pulsating sensation: Patient notes at times she can hear thumping in her ear.  No high-pitched tinnitus.  No hearing loss.  No ear fullness.  GI symptoms/headache/allergic rhinitis: Patient notes she had 2 days of loose stools with diffuse mild headaches occurring during her menstrual cycle.  Notes once her menstrual cycle resolved her symptoms improved significantly.  She also reports about 1 week of feeling mildly fatigued and some mild allergy symptoms.  She notes no COVID-19 exposure.  No fever or cough.  She notes her allergy symptoms have improved significantly since she had sinus surgery previously.  She did have a COVID-19 test completed through her work that was required regardless of exposure or symptoms.  Social History   Tobacco Use  Smoking Status Never Smoker  Smokeless Tobacco Never Used     ROS see history of present illness  Objective  Physical Exam Vitals:   06/29/19 0913  BP: 110/80  Pulse: 65  Temp: 98.4 F (36.9 C)  SpO2: 96%    BP Readings from Last 3 Encounters:  06/29/19 110/80  01/24/19 102/62  01/10/19 108/62   Wt Readings from Last 3 Encounters:  06/29/19 215  lb 6.4 oz (97.7 kg)  01/24/19 216 lb 3.2 oz (98.1 kg)  01/10/19 213 lb 4 oz (96.7 kg)    Physical Exam Constitutional:      General: She is not in acute distress.    Appearance: She is not diaphoretic.  HENT:     Head: Normocephalic and atraumatic.     Right Ear: Tympanic membrane normal.     Left Ear: Tympanic membrane normal.     Mouth/Throat:     Mouth: Mucous membranes are moist.     Pharynx: Oropharynx is clear.  Eyes:     Conjunctiva/sclera: Conjunctivae normal.     Pupils: Pupils are equal, round, and reactive to light.  Cardiovascular:     Rate and Rhythm: Normal rate and regular rhythm.     Heart sounds: Normal heart sounds.  Pulmonary:     Effort: Pulmonary effort is normal.     Breath sounds: Normal breath sounds.  Abdominal:     General: Bowel sounds are normal. There is no distension.     Palpations: Abdomen is soft.     Tenderness: There is no abdominal tenderness. There is no guarding or rebound.  Musculoskeletal:     Right lower leg: No edema.     Left lower leg: No edema.  Lymphadenopathy:     Cervical: No cervical adenopathy.  Skin:    General: Skin is warm and dry.  Neurological:     Mental  Status: She is alert.      Assessment/Plan: Please see individual problem list.  Allergic rhinitis Patient's upper respiratory symptoms are most likely related to allergic rhinitis.  She will trial over-the-counter second-generation antihistamine.  Monitor for any worsening of symptoms.  She will inform us when her COVID-19 test returns.  Rotator cuff impingement syndrome of left shoulder She will see orthopedics for this.  Anxiety and depression She will continue to see her therapist.  Apnea Home sleep study ordered.  Subjective pulsatile tinnitus of right ear Refer to ENT for evaluation.  Loose stools Likely related to her menstrual cycle given time course. She will monitor given resolution.    No orders of the defined types were placed in this  encounter.   No orders of the defined types were placed in this encounter.    [name], MD [place]

## 2019-07-03 NOTE — Assessment & Plan Note (Signed)
She will see orthopedics for this.

## 2019-07-03 NOTE — Assessment & Plan Note (Signed)
Refer to ENT for evaluation 

## 2019-07-06 ENCOUNTER — Ambulatory Visit (INDEPENDENT_AMBULATORY_CARE_PROVIDER_SITE_OTHER): Payer: BC Managed Care – PPO | Admitting: Psychology

## 2019-07-06 DIAGNOSIS — F330 Major depressive disorder, recurrent, mild: Secondary | ICD-10-CM | POA: Diagnosis not present

## 2019-07-06 DIAGNOSIS — F4310 Post-traumatic stress disorder, unspecified: Secondary | ICD-10-CM

## 2019-07-15 ENCOUNTER — Other Ambulatory Visit: Payer: Self-pay | Admitting: Otolaryngology

## 2019-07-15 DIAGNOSIS — H93A1 Pulsatile tinnitus, right ear: Secondary | ICD-10-CM | POA: Diagnosis not present

## 2019-07-15 DIAGNOSIS — J301 Allergic rhinitis due to pollen: Secondary | ICD-10-CM | POA: Diagnosis not present

## 2019-07-19 ENCOUNTER — Other Ambulatory Visit: Payer: Self-pay | Admitting: Family Medicine

## 2019-07-19 DIAGNOSIS — R0602 Shortness of breath: Secondary | ICD-10-CM | POA: Diagnosis not present

## 2019-07-19 DIAGNOSIS — G4733 Obstructive sleep apnea (adult) (pediatric): Secondary | ICD-10-CM | POA: Diagnosis not present

## 2019-07-19 LAB — PULMONARY FUNCTION TEST

## 2019-07-20 ENCOUNTER — Ambulatory Visit: Payer: BC Managed Care – PPO | Admitting: Psychology

## 2019-07-20 DIAGNOSIS — G4733 Obstructive sleep apnea (adult) (pediatric): Secondary | ICD-10-CM | POA: Diagnosis not present

## 2019-07-20 DIAGNOSIS — R0602 Shortness of breath: Secondary | ICD-10-CM | POA: Diagnosis not present

## 2019-07-21 ENCOUNTER — Other Ambulatory Visit: Payer: Self-pay

## 2019-07-21 ENCOUNTER — Encounter
Admission: RE | Admit: 2019-07-21 | Discharge: 2019-07-21 | Disposition: A | Discharge status: Home / Self Care | Payer: BC Managed Care – PPO | Source: Ambulatory Visit | Attending: Surgery | Admitting: Surgery

## 2019-07-21 DIAGNOSIS — Z01812 Encounter for preprocedural laboratory examination: Secondary | ICD-10-CM | POA: Insufficient documentation

## 2019-07-21 HISTORY — DX: Arteriovenous malformation of cerebral vessels: Q28.2

## 2019-07-21 HISTORY — DX: Prediabetes: R73.03

## 2019-07-21 HISTORY — DX: Anxiety disorder, unspecified: F41.9

## 2019-07-21 HISTORY — DX: Hypotension, unspecified: I95.9

## 2019-07-21 LAB — CBC
HCT: 43.9 % (ref 36.0–46.0)
Hemoglobin: 14.2 g/dL (ref 12.0–15.0)
MCH: 28.3 pg (ref 26.0–34.0)
MCHC: 32.3 g/dL (ref 30.0–36.0)
MCV: 87.5 fL (ref 80.0–100.0)
Platelets: 247 10*3/uL (ref 150–400)
RBC: 5.02 MIL/uL (ref 3.87–5.11)
RDW: 12.6 % (ref 11.5–15.5)
WBC: 5.5 10*3/uL (ref 4.0–10.5)
nRBC: 0 % (ref 0.0–0.2)

## 2019-07-21 NOTE — Patient Instructions (Addendum)
Your procedure is scheduled on: 07-28-19 THURSDAY Report to Same Day Surgery 2nd floor medical mall [facility] Entrance-take elevator on left to 2nd floor.  Check in with surgery information desk.) To find out your arrival time please call [phone] between 1PM - 3PM on 07-27-19 [facility]  Remember: Instructions that are not followed completely may result in serious medical risk, up to and including death, or upon the discretion of your surgeon and anesthesiologist your surgery may need to be rescheduled.    _x___ 1. Do not eat food after midnight the night before your procedure. NO GUM OR CANDY AFTER MIDNIGHT. You may drink clear liquids up to 2 hours before you are scheduled to arrive at the hospital for your procedure.  Do not drink clear liquids within 2 hours of your scheduled arrival to the hospital.  Clear liquids include  --Water or Apple juice without pulp  --Clear carbohydrate beverage such as ClearFast or Gatorade  --Black Coffee or Clear Tea (No milk, no creamers, do not add anything to the coffee or Tea Type 1 and type 2 diabetics should only drink water.   ____Ensure clear carbohydrate drink on the way to the hospital for bariatric patients  ____Ensure clear carbohydrate drink 3 hours before surgery.     __x__ 2. No Alcohol for 24 hours before or after surgery.   __x__3. No Smoking or e-cigarettes for 24 prior to surgery.  Do not use any chewable tobacco products for at least 6 hour prior to surgery   ____  4. Bring all medications with you on the day of surgery if instructed.    __x__ 5. Notify your doctor if there is any change in your medical condition     (cold, fever, infections).    x___6. On the morning of surgery brush your teeth with toothpaste and water.  You may rinse your mouth with mouth wash if you wish.  Do not swallow any toothpaste or mouthwash.   Do not wear jewelry, make-up, hairpins, clips or nail polish.  Do not wear lotions, powders, or  perfumes. You may wear deodorant.  Do not shave 48 hours prior to surgery. Men may shave face and neck.  Do not bring valuables to the hospital.    [facility] is not responsible for any belongings or valuables.               Contacts, dentures or bridgework may not be worn into surgery.  Leave your suitcase in the car. After surgery it may be brought to your room.  For patients admitted to the hospital, discharge time is determined by your treatment team.  _  Patients discharged the day of surgery will not be allowed to drive home.  You will need someone to drive you home and stay with you the night of your procedure.    Please read over the following fact sheets that you were given:   [facility] Preparing for Surgery and or MRSA Information   _x___ TAKE THE FOLLOWING MEDICATION THE MORNING OF SURGERY WITH A SMALL SIP OF WATER. These include:  1. LEVOTHYROXINE (SYNTHROID)  2. DEXILANT   3.TAKE AN EXTRA DEXILANT THE NIGHT BEFORE YOUR SURGERY  4.  5.  6.  ____Fleets enema or Magnesium Citrate as directed.   _x___ Use CHG Soap or sage wipes as directed on instruction sheet   ____ Use inhalers on the day of surgery and bring to hospital day of surgery  ____ Stop Metformin and Janumet 2  days prior to surgery.    ____ Take 1/2 of usual insulin dose the night before surgery and none on the morning surgery.   ____ Follow recommendations from Cardiologist, Pulmonologist or PCP regarding stopping Aspirin, Coumadin, Plavix ,Eliquis, Effient, or Pradaxa, and Pletal.  X____Stop Anti-inflammatories such as Advil, Aleve, Ibuprofen, Motrin, Naproxen, Naprosyn, Goodies powders or aspirin products NOW- OK to take Tylenol    _x___ Stop supplements until after surgery-STOP MELATONIN NOW-MAY RESUME AFTER SURGERY   ____ Bring C-Pap to the hospital.

## 2019-07-22 ENCOUNTER — Encounter: Payer: Self-pay | Admitting: Family Medicine

## 2019-07-25 ENCOUNTER — Other Ambulatory Visit
Admission: RE | Admit: 2019-07-25 | Discharge: 2019-07-25 | Disposition: A | Discharge status: Home / Self Care | Payer: BC Managed Care – PPO | Source: Ambulatory Visit | Attending: Surgery | Admitting: Surgery

## 2019-07-25 ENCOUNTER — Other Ambulatory Visit: Payer: Self-pay

## 2019-07-25 DIAGNOSIS — Z20828 Contact with and (suspected) exposure to other viral communicable diseases: Secondary | ICD-10-CM | POA: Diagnosis not present

## 2019-07-25 DIAGNOSIS — Z01812 Encounter for preprocedural laboratory examination: Secondary | ICD-10-CM | POA: Insufficient documentation

## 2019-07-25 LAB — SARS CORONAVIRUS 2 (TAT 6-24 HRS): SARS Coronavirus 2: NEGATIVE

## 2019-07-27 ENCOUNTER — Other Ambulatory Visit: Payer: Self-pay

## 2019-07-27 ENCOUNTER — Ambulatory Visit
Admission: RE | Admit: 2019-07-27 | Discharge: 2019-07-27 | Disposition: A | Discharge status: Home / Self Care | Payer: BC Managed Care – PPO | Source: Ambulatory Visit | Attending: Otolaryngology | Admitting: Otolaryngology

## 2019-07-27 DIAGNOSIS — H93A1 Pulsatile tinnitus, right ear: Secondary | ICD-10-CM | POA: Insufficient documentation

## 2019-07-27 DIAGNOSIS — D1802 Hemangioma of intracranial structures: Secondary | ICD-10-CM | POA: Diagnosis not present

## 2019-07-27 MED ORDER — CEFAZOLIN SODIUM-DEXTROSE 2-4 GM/100ML-% IV SOLN
2.0000 g | Freq: Once | INTRAVENOUS | Status: AC
  Administered 2019-07-28: 2 g via INTRAVENOUS

## 2019-07-27 MED ORDER — GADOBUTROL 1 MMOL/ML IV SOLN
9.0000 mL | Freq: Once | INTRAVENOUS | Status: AC | PRN
  Administered 2019-07-27: 9 mL via INTRAVENOUS

## 2019-07-27 NOTE — Pre-Procedure Instructions (Signed)
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 Dr Kayleen Memos said MRI is stable and pt ok to proceed. Pt with no neurologic symptoms

## 2019-07-28 ENCOUNTER — Ambulatory Visit
Admission: RE | Admit: 2019-07-28 | Discharge: 2019-07-28 | Disposition: A | Discharge status: Home / Self Care | Payer: BC Managed Care – PPO | Source: Ambulatory Visit | Attending: Surgery | Admitting: Surgery

## 2019-07-28 ENCOUNTER — Ambulatory Visit: Payer: BC Managed Care – PPO | Admitting: Anesthesiology

## 2019-07-28 ENCOUNTER — Encounter
Admission: RE | Disposition: A | Discharge status: Home / Self Care | Payer: Self-pay | Source: Ambulatory Visit | Attending: Surgery

## 2019-07-28 ENCOUNTER — Encounter: Payer: Self-pay | Admitting: *Deleted

## 2019-07-28 ENCOUNTER — Ambulatory Visit: Payer: BC Managed Care – PPO

## 2019-07-28 DIAGNOSIS — E039 Hypothyroidism, unspecified: Secondary | ICD-10-CM | POA: Diagnosis not present

## 2019-07-28 DIAGNOSIS — K219 Gastro-esophageal reflux disease without esophagitis: Secondary | ICD-10-CM | POA: Insufficient documentation

## 2019-07-28 DIAGNOSIS — Z6834 Body mass index (BMI) 34.0-34.9, adult: Secondary | ICD-10-CM | POA: Diagnosis not present

## 2019-07-28 DIAGNOSIS — E669 Obesity, unspecified: Secondary | ICD-10-CM | POA: Diagnosis not present

## 2019-07-28 DIAGNOSIS — M7542 Impingement syndrome of left shoulder: Secondary | ICD-10-CM | POA: Diagnosis not present

## 2019-07-28 DIAGNOSIS — E7800 Pure hypercholesterolemia, unspecified: Secondary | ICD-10-CM | POA: Diagnosis not present

## 2019-07-28 DIAGNOSIS — J309 Allergic rhinitis, unspecified: Secondary | ICD-10-CM | POA: Diagnosis not present

## 2019-07-28 DIAGNOSIS — M75112 Incomplete rotator cuff tear or rupture of left shoulder, not specified as traumatic: Secondary | ICD-10-CM | POA: Insufficient documentation

## 2019-07-28 DIAGNOSIS — M25512 Pain in left shoulder: Secondary | ICD-10-CM | POA: Diagnosis not present

## 2019-07-28 DIAGNOSIS — Z79890 Hormone replacement therapy (postmenopausal): Secondary | ICD-10-CM | POA: Diagnosis not present

## 2019-07-28 DIAGNOSIS — M7522 Bicipital tendinitis, left shoulder: Secondary | ICD-10-CM | POA: Insufficient documentation

## 2019-07-28 DIAGNOSIS — M67814 Other specified disorders of tendon, left shoulder: Secondary | ICD-10-CM | POA: Insufficient documentation

## 2019-07-28 DIAGNOSIS — M7582 Other shoulder lesions, left shoulder: Secondary | ICD-10-CM | POA: Diagnosis not present

## 2019-07-28 DIAGNOSIS — G8918 Other acute postprocedural pain: Secondary | ICD-10-CM | POA: Diagnosis not present

## 2019-07-28 DIAGNOSIS — Z79899 Other long term (current) drug therapy: Secondary | ICD-10-CM | POA: Diagnosis not present

## 2019-07-28 HISTORY — PX: BICEPT TENODESIS: SHX5116

## 2019-07-28 HISTORY — PX: SHOULDER ARTHROSCOPY WITH SUBACROMIAL DECOMPRESSION, ROTATOR CUFF REPAIR AND BICEP TENDON REPAIR: SHX5687

## 2019-07-28 LAB — POCT PREGNANCY, URINE: Preg Test, Ur: NEGATIVE

## 2019-07-28 SURGERY — SHOULDER ARTHROSCOPY WITH SUBACROMIAL DECOMPRESSION, ROTATOR CUFF REPAIR AND BICEP TENDON REPAIR
Anesthesia: General | Laterality: Left

## 2019-07-28 MED ORDER — DEXAMETHASONE SODIUM PHOSPHATE 10 MG/ML IJ SOLN
INTRAMUSCULAR | Status: AC
  Filled 2019-07-28: qty 1

## 2019-07-28 MED ORDER — ACETAMINOPHEN 325 MG PO TABS: 325.0000 mg | ORAL_TABLET | ORAL | Status: DC | PRN

## 2019-07-28 MED ORDER — BUPIVACAINE LIPOSOME 1.3 % IJ SUSP
INTRAMUSCULAR | Status: DC | PRN
  Administered 2019-07-28: 20 mL

## 2019-07-28 MED ORDER — MELOXICAM 7.5 MG PO TABS: 7.5000 mg | ORAL_TABLET | Freq: Two times a day (BID) | ORAL | 0 refills | Status: DC

## 2019-07-28 MED ORDER — PROMETHAZINE HCL 25 MG/ML IJ SOLN
6.2500 mg | INTRAMUSCULAR | Status: DC | PRN
  Administered 2019-07-28: 10:00:00 6.25 mg via INTRAVENOUS

## 2019-07-28 MED ORDER — LIDOCAINE HCL (CARDIAC) PF 100 MG/5ML IV SOSY
PREFILLED_SYRINGE | INTRAVENOUS | Status: DC | PRN
  Administered 2019-07-28: 80 mg via INTRAVENOUS

## 2019-07-28 MED ORDER — EPINEPHRINE PF 1 MG/ML IJ SOLN
INTRAMUSCULAR | Status: AC
  Filled 2019-07-28: qty 2

## 2019-07-28 MED ORDER — LACTATED RINGERS IV SOLN
INTRAVENOUS | Status: DC
  Administered 2019-07-28 (×2): via INTRAVENOUS

## 2019-07-28 MED ORDER — ONDANSETRON HCL 4 MG/2ML IJ SOLN
INTRAMUSCULAR | Status: AC
  Filled 2019-07-28: qty 2

## 2019-07-28 MED ORDER — PHENYLEPHRINE HCL (PRESSORS) 10 MG/ML IV SOLN
INTRAVENOUS | Status: AC
  Filled 2019-07-28: qty 1

## 2019-07-28 MED ORDER — MIDAZOLAM HCL 2 MG/2ML IJ SOLN
1.0000 mg | Freq: Once | INTRAMUSCULAR | Status: AC
  Administered 2019-07-28: 1 mg via INTRAVENOUS

## 2019-07-28 MED ORDER — FENTANYL CITRATE (PF) 100 MCG/2ML IJ SOLN: 25.0000 ug | INTRAMUSCULAR | Status: DC | PRN

## 2019-07-28 MED ORDER — SUCCINYLCHOLINE CHLORIDE 20 MG/ML IJ SOLN
INTRAMUSCULAR | Status: AC
  Filled 2019-07-28: qty 1

## 2019-07-28 MED ORDER — ONDANSETRON HCL 4 MG/2ML IJ SOLN
INTRAMUSCULAR | Status: DC | PRN
  Administered 2019-07-28: 4 mg via INTRAVENOUS

## 2019-07-28 MED ORDER — EPHEDRINE SULFATE 50 MG/ML IJ SOLN
INTRAMUSCULAR | Status: DC | PRN
  Administered 2019-07-28 (×2): 10 mg via INTRAVENOUS

## 2019-07-28 MED ORDER — OXYCODONE HCL 5 MG PO TABS
5.0000 mg | ORAL_TABLET | ORAL | Status: DC | PRN
  Filled 2019-07-28: qty 2

## 2019-07-28 MED ORDER — ONDANSETRON HCL 4 MG/2ML IJ SOLN: 4.0000 mg | Freq: Four times a day (QID) | INTRAMUSCULAR | Status: DC | PRN

## 2019-07-28 MED ORDER — BUPIVACAINE HCL (PF) 0.5 % IJ SOLN
INTRAMUSCULAR | Status: DC | PRN
  Administered 2019-07-28: 10 mL

## 2019-07-28 MED ORDER — FENTANYL CITRATE (PF) 100 MCG/2ML IJ SOLN
INTRAMUSCULAR | Status: AC
  Filled 2019-07-28: qty 2

## 2019-07-28 MED ORDER — POTASSIUM CHLORIDE IN NACL 20-0.9 MEQ/L-% IV SOLN: INTRAVENOUS | Status: DC

## 2019-07-28 MED ORDER — SUGAMMADEX SODIUM 200 MG/2ML IV SOLN
INTRAVENOUS | Status: DC | PRN
  Administered 2019-07-28: 200 mg via INTRAVENOUS

## 2019-07-28 MED ORDER — BUPIVACAINE-EPINEPHRINE (PF) 0.5% -1:200000 IJ SOLN
INTRAMUSCULAR | Status: AC
  Filled 2019-07-28: qty 30

## 2019-07-28 MED ORDER — BUPIVACAINE HCL (PF) 0.5 % IJ SOLN
INTRAMUSCULAR | Status: AC
  Filled 2019-07-28: qty 10

## 2019-07-28 MED ORDER — ROCURONIUM BROMIDE 100 MG/10ML IV SOLN
INTRAVENOUS | Status: DC | PRN
  Administered 2019-07-28: 50 mg via INTRAVENOUS

## 2019-07-28 MED ORDER — DEXAMETHASONE SODIUM PHOSPHATE 10 MG/ML IJ SOLN
INTRAMUSCULAR | Status: DC | PRN
  Administered 2019-07-28: 10 mg via INTRAVENOUS

## 2019-07-28 MED ORDER — HYDROCODONE-ACETAMINOPHEN 7.5-325 MG PO TABS
1.0000 | ORAL_TABLET | Freq: Once | ORAL | Status: DC | PRN
  Filled 2019-07-28: qty 1

## 2019-07-28 MED ORDER — EPHEDRINE SULFATE 50 MG/ML IJ SOLN
INTRAMUSCULAR | Status: AC
  Filled 2019-07-28: qty 1

## 2019-07-28 MED ORDER — BUPIVACAINE-EPINEPHRINE 0.5% -1:200000 IJ SOLN
INTRAMUSCULAR | Status: DC | PRN
  Administered 2019-07-28: 30 mL

## 2019-07-28 MED ORDER — PROMETHAZINE HCL 25 MG/ML IJ SOLN
INTRAMUSCULAR | Status: AC
  Administered 2019-07-28: 6.25 mg via INTRAVENOUS
  Filled 2019-07-28: qty 1

## 2019-07-28 MED ORDER — MIDAZOLAM HCL 2 MG/2ML IJ SOLN
1.0000 mg | Freq: Once | INTRAMUSCULAR | Status: AC
  Administered 2019-07-28: 07:00:00 1 mg via INTRAVENOUS

## 2019-07-28 MED ORDER — PROPOFOL 10 MG/ML IV BOLUS
INTRAVENOUS | Status: DC | PRN
  Administered 2019-07-28: 160 mg via INTRAVENOUS

## 2019-07-28 MED ORDER — LIDOCAINE HCL (PF) 2 % IJ SOLN
INTRAMUSCULAR | Status: AC
  Filled 2019-07-28: qty 10

## 2019-07-28 MED ORDER — OXYCODONE HCL 5 MG PO TABS: 5.0000 mg | ORAL_TABLET | ORAL | 0 refills | Status: DC | PRN

## 2019-07-28 MED ORDER — PROPOFOL 10 MG/ML IV BOLUS
INTRAVENOUS | Status: AC
  Filled 2019-07-28: qty 20

## 2019-07-28 MED ORDER — SODIUM CHLORIDE FLUSH 0.9 % IV SOLN
INTRAVENOUS | Status: AC
  Filled 2019-07-28: qty 10

## 2019-07-28 MED ORDER — ROCURONIUM BROMIDE 50 MG/5ML IV SOLN
INTRAVENOUS | Status: AC
  Filled 2019-07-28: qty 1

## 2019-07-28 MED ORDER — METOCLOPRAMIDE HCL 10 MG PO TABS: 5.0000 mg | ORAL_TABLET | Freq: Three times a day (TID) | ORAL | Status: DC | PRN

## 2019-07-28 MED ORDER — SEVOFLURANE IN SOLN
RESPIRATORY_TRACT | Status: AC
  Filled 2019-07-28: qty 250

## 2019-07-28 MED ORDER — FENTANYL CITRATE (PF) 100 MCG/2ML IJ SOLN
50.0000 ug | Freq: Once | INTRAMUSCULAR | Status: AC
  Administered 2019-07-28: 07:00:00 50 ug via INTRAVENOUS

## 2019-07-28 MED ORDER — MIDAZOLAM HCL 2 MG/2ML IJ SOLN
INTRAMUSCULAR | Status: AC
  Administered 2019-07-28: 1 mg via INTRAVENOUS
  Filled 2019-07-28: qty 2

## 2019-07-28 MED ORDER — FENTANYL CITRATE (PF) 100 MCG/2ML IJ SOLN
INTRAMUSCULAR | Status: AC
  Administered 2019-07-28: 50 ug via INTRAVENOUS
  Filled 2019-07-28: qty 2

## 2019-07-28 MED ORDER — MIDAZOLAM HCL 2 MG/2ML IJ SOLN
INTRAMUSCULAR | Status: AC
  Filled 2019-07-28: qty 2

## 2019-07-28 MED ORDER — FENTANYL CITRATE (PF) 100 MCG/2ML IJ SOLN
50.0000 ug | Freq: Once | INTRAMUSCULAR | Status: AC
  Administered 2019-07-28: 50 ug via INTRAVENOUS

## 2019-07-28 MED ORDER — FENTANYL CITRATE (PF) 100 MCG/2ML IJ SOLN
INTRAMUSCULAR | Status: DC | PRN
  Administered 2019-07-28: 50 ug via INTRAVENOUS

## 2019-07-28 MED ORDER — LIDOCAINE HCL (PF) 1 % IJ SOLN
INTRAMUSCULAR | Status: AC
  Filled 2019-07-28: qty 5

## 2019-07-28 MED ORDER — ONDANSETRON HCL 4 MG PO TABS: 4.0000 mg | ORAL_TABLET | Freq: Four times a day (QID) | ORAL | Status: DC | PRN

## 2019-07-28 MED ORDER — ACETAMINOPHEN 160 MG/5ML PO SOLN
325.0000 mg | ORAL | Status: DC | PRN
  Filled 2019-07-28: qty 20.3

## 2019-07-28 MED ORDER — MEPERIDINE HCL 50 MG/ML IJ SOLN: 6.2500 mg | INTRAMUSCULAR | Status: DC | PRN

## 2019-07-28 MED ORDER — BUPIVACAINE HCL (PF) 0.5 % IJ SOLN
INTRAMUSCULAR | Status: AC
  Filled 2019-07-28: qty 30

## 2019-07-28 MED ORDER — CEFAZOLIN SODIUM-DEXTROSE 2-4 GM/100ML-% IV SOLN
INTRAVENOUS | Status: AC
  Filled 2019-07-28: qty 100

## 2019-07-28 MED ORDER — BUPIVACAINE LIPOSOME 1.3 % IJ SUSP
INTRAMUSCULAR | Status: AC
  Filled 2019-07-28: qty 20

## 2019-07-28 MED ORDER — METOCLOPRAMIDE HCL 5 MG/ML IJ SOLN: 5.0000 mg | Freq: Three times a day (TID) | INTRAMUSCULAR | Status: DC | PRN

## 2019-07-28 SURGICAL SUPPLY — 52 items
ANCHOR BONE REGENETEN (Anchor) ×1 IMPLANT
ANCHOR JUGGERKNOT WTAP NDL 2.9 (Anchor) ×1 IMPLANT
ANCHOR TENDON REGENETEN (Staple) ×1 IMPLANT
BIT DRILL JUGRKNT W/NDL BIT2.9 (DRILL) IMPLANT
BLADE FULL RADIUS 3.5 (BLADE) ×3 IMPLANT
BUR ACROMIONIZER 4.0 (BURR) ×3 IMPLANT
CANNULA SHAVER 8MMX76MM (CANNULA) ×3 IMPLANT
CHLORAPREP W/TINT 26 (MISCELLANEOUS) ×3 IMPLANT
COOLER POLAR GLACIER W/PUMP (MISCELLANEOUS) ×1 IMPLANT
COVER MAYO STAND REUSABLE (DRAPES) ×3 IMPLANT
COVER WAND RF STERILE (DRAPES) ×3 IMPLANT
DRAPE IMP U-DRAPE 54X76 (DRAPES) ×5 IMPLANT
DRAPE SPLIT 6X30 W/TAPE (DRAPES) ×5 IMPLANT
DRILL JUGGERKNOT W/NDL BIT 2.9 (DRILL) ×3
ELECT CAUTERY BLADE TIP 2.5 (TIP) ×3
ELECT REM PT RETURN 9FT ADLT (ELECTROSURGICAL) ×3
ELECTRODE CAUTERY BLDE TIP 2.5 (TIP) IMPLANT
ELECTRODE REM PT RTRN 9FT ADLT (ELECTROSURGICAL) ×2 IMPLANT
GAUZE SPONGE 4X4 12PLY STRL (GAUZE/BANDAGES/DRESSINGS) ×3 IMPLANT
GAUZE XEROFORM 1X8 LF (GAUZE/BANDAGES/DRESSINGS) ×3 IMPLANT
GLOVE BIO SURGEON STRL SZ7.5 (GLOVE) ×6 IMPLANT
GLOVE BIO SURGEON STRL SZ8 (GLOVE) ×6 IMPLANT
GLOVE BIOGEL PI IND STRL 8 (GLOVE) ×2 IMPLANT
GLOVE BIOGEL PI INDICATOR 8 (GLOVE) ×1
GLOVE INDICATOR 8.0 STRL GRN (GLOVE) ×3 IMPLANT
GOWN STRL REUS W/ TWL LRG LVL3 (GOWN DISPOSABLE) ×2 IMPLANT
GOWN STRL REUS W/ TWL XL LVL3 (GOWN DISPOSABLE) ×2 IMPLANT
GOWN STRL REUS W/TWL LRG LVL3 (GOWN DISPOSABLE) ×1
GOWN STRL REUS W/TWL XL LVL3 (GOWN DISPOSABLE) ×1
GRASPER SUT 15 45D LOW PRO (SUTURE) IMPLANT
IMPL REGENETEN MEDIUM (Shoulder) IMPLANT
IMPLANT REGENETEN MEDIUM (Shoulder) ×3 IMPLANT
IV LACTATED RINGER IRRG 3000ML (IV SOLUTION) ×2
IV LR IRRIG 3000ML ARTHROMATIC (IV SOLUTION) ×4 IMPLANT
MANIFOLD NEPTUNE II (INSTRUMENTS) ×3 IMPLANT
MASK FACE SPIDER DISP (MASK) ×3 IMPLANT
MAT ABSORB  FLUID 56X50 GRAY (MISCELLANEOUS) ×1
MAT ABSORB FLUID 56X50 GRAY (MISCELLANEOUS) ×2 IMPLANT
PACK ARTHROSCOPY SHOULDER (MISCELLANEOUS) ×3 IMPLANT
PAD WRAPON POLAR SHDR UNIV (MISCELLANEOUS) IMPLANT
SLING ARM LRG DEEP (SOFTGOODS) ×2 IMPLANT
SLING ULTRA II LG (MISCELLANEOUS) ×3 IMPLANT
STAPLER SKIN PROX 35W (STAPLE) ×3 IMPLANT
STRAP SAFETY 5IN WIDE (MISCELLANEOUS) ×3 IMPLANT
SUT ETHIBOND 0 MO6 C/R (SUTURE) ×3 IMPLANT
SUT VIC AB 2-0 CT1 27 (SUTURE) ×2
SUT VIC AB 2-0 CT1 TAPERPNT 27 (SUTURE) ×4 IMPLANT
TAPE MICROFOAM 4IN (TAPE) ×3 IMPLANT
TUBING ARTHRO INFLOW-ONLY STRL (TUBING) ×3 IMPLANT
TUBING CONNECTING 10 (TUBING) ×3 IMPLANT
WAND WEREWOLF FLOW 90D (MISCELLANEOUS) ×3 IMPLANT
WRAPON POLAR PAD SHDR UNIV (MISCELLANEOUS) ×3

## 2019-07-28 NOTE — H&P (Signed)
Paper H&P to be scanned into permanent record. H&P reviewed and patient re-examined. No changes. 

## 2019-07-28 NOTE — Anesthesia Procedure Notes (Signed)
Anesthesia Regional Block: Interscalene brachial plexus block   Pre-Anesthetic Checklist: ,, timeout performed, Correct Patient, Correct Site, Correct Laterality, Correct Procedure, Correct Position, site marked, Risks and benefits discussed,  Surgical consent,  Pre-op evaluation,  At surgeon's request and post-op pain management  Laterality: Left  Prep: chloraprep       Needles:  Injection technique: Single-shot  Needle Type: Echogenic Stimulator Needle     Needle Length: 10cm  Needle Gauge: 21   Needle insertion depth: 6 cm   Additional Needles:   Procedures: Doppler guided,,,, ultrasound used (permanent image in chart),,,,  Narrative:  Start time: 07/28/2019 7:14 AM End time: 07/28/2019 7:24 AM Injection made incrementally with aspirations every 5 mL.  Events:, injection resistance,,,,,,,,,  Performed by: Personally  Anesthesiologist: Alphonsus Sias, MD  Additional Notes: Functioning IV was confirmed and monitors were applied, mild IVS, O2 [place].  A 165mm 21ga ECHOplex needle was used. Sterile prep and drape,hand hygiene and sterile gloves were used.  Negative aspiration and negative test dose prior to incremental administration of local anesthetic. Images stored. LA: 10 ml 0.5% bupiv/20 ml Exparel. The patient tolerated the procedure well.

## 2019-07-28 NOTE — Discharge Instructions (Addendum)
Orthopedic discharge instructions: Keep dressing dry and intact.  May shower after dressing changed on post-op day #4 (Monday).  Cover staples/sutures with Band-Aids after drying off. Apply ice frequently to shoulder. Take meloxicam 7.5 mg BID OR ibuprofen 600-800 mg TID with meals for 7-10 days, then as necessary. Take oxycodone as prescribed when needed.  May supplement with ES Tylenol if necessary. Keep shoulder immobilizer on at all times except may remove for bathing purposes. Follow-up in 10-14 days or as scheduled.  AMBULATORY SURGERY  DISCHARGE INSTRUCTIONS   1) The drugs that you were given will stay in your system until tomorrow so for the next 24 hours you should not:  A) Drive an automobile B) Make any legal decisions C) Drink any alcoholic beverage   2) You may resume regular meals tomorrow.  Today it is better to start with liquids and gradually work up to solid foods.  You may eat anything you prefer, but it is better to start with liquids, then soup and crackers, and gradually work up to solid foods.   3) Please notify your doctor immediately if you have any unusual bleeding, trouble breathing, redness and pain at the surgery site, drainage, fever, or pain not relieved by medication.    4) Additional Instructions:        Please contact your physician with any problems or Same Day Surgery at [phone], Monday through Friday 6 am to 4 pm, or [place] at [facility] number at [phone].

## 2019-07-28 NOTE — Op Note (Signed)
07/28/2019  9:23 AM  Patient:   Stephanie Shields  Pre-Op Diagnosis:   Impingement/tendinopathy with partial thickness rotator cuff tear, left shoulder.  Post-Op Diagnosis:   Impingement/tendinopathy with partial-thickness (interstitial) rotator cuff tear, labral fraying, and biceps tendinopathy, left shoulder.  Procedure:   Limited arthroscopic debridement, arthroscopic subacromial decompression, mini-open rotator cuff repair using [employer] Regeneten patch, and mini-open biceps tenodesis, left shoulder.  Anesthesia:   General endotracheal with interscalene block using Exparel placed preoperatively by the anesthesiologist.  Surgeon:   Pascal Lux, MD  Assistant:   Cameron Proud, PA-C  Findings:   As above.  There was extensive synovitis anteriorly, superiorly, and posterior superiorly.  The rotator cuff appeared to be intact on his bursal and articular surfaces, although there was an area of thinning/softening involving the anterior insertional fibers of the supraspinatus tendon, consistent with the MRI findings.  The biceps tendon demonstrated evidence of "lip sticking" without partial or full-thickness tearing.  Labrum demonstrated areas of central fraying anteriorly, superiorly, and posteriorly without detachment from the glenoid.  Complications:   None  Fluids:   1000 cc  Estimated blood loss:   15 cc  Tourniquet time:   None  Drains:   None  Closure:   Staples      Brief clinical note:   The patient is a 47 year old female with a history of left shoulder pain. The patient's symptoms have progressed despite medications, activity modification, etc. The patient's history and examination are consistent with impingement/tendinopathy with a possible rotator cuff tear. The pre-operative MRI scan demonstrated an area of partial-thickness tearing of the anterior insertional fibers of the supraspinatus tendon. The patient presents at this time for definitive management of these  shoulder symptoms.  Procedure:   The patient underwent placement of an interscalene block using Exparel by the anesthesiologist in the preoperative holding area before being brought into the operating room and lain in the supine position. The patient then underwent general endotracheal intubation and anesthesia before being repositioned in the beach chair position using the beach chair positioner. The left shoulder and upper extremity were prepped with ChloraPrep solution before being draped sterilely. Preoperative antibiotics were administered. A timeout was performed to confirm the proper surgical site before the expected portal sites and incision site were injected with 0.5% Sensorcaine with epinephrine. A posterior portal was created and the glenohumeral joint thoroughly inspected with the findings as described above. An anterior portal was created using an outside-in technique. The labrum and rotator cuff were further probed, again confirming the above-noted findings. The areas of labral fraying and synovitis were debrided back to stable margins using the full-radius resector. The ArthroCare wand was inserted and used to release the biceps tendon from its labral anchor. It also was used to obtain hemostasis as well as to "anneal" the labrum superiorly and anteriorly. The instruments were removed from the joint after suctioning the excess fluid.  The camera was repositioned through the posterior portal into the subacromial space. A separate lateral portal was created using an outside-in technique. The 3.5 mm full-radius resector was introduced and used to perform a subtotal bursectomy. The ArthroCare wand was then inserted and used to remove the periosteal tissue off the undersurface of the anterior third of the acromion as well as to recess the coracoacromial ligament from its attachment along the anterior and lateral margins of the acromion. The 4.0 mm acromionizing bur was introduced and used to complete  the decompression by removing the undersurface of the  anterior third of the acromion. The full radius resector was reintroduced to remove any residual bony debris before the ArthroCare wand was reintroduced to obtain hemostasis. The instruments were then removed from the subacromial space after suctioning the excess fluid.  An approximately 4-5 cm incision was made over the anterolateral aspect of the shoulder beginning at the anterolateral corner of the acromion and extending distally in line with the bicipital groove. This incision was carried down through the subcutaneous tissues to expose the deltoid fascia. The raphae between the anterior and middle thirds was identified and this plane developed to provide access into the subacromial space. Additional bursal tissues were debrided sharply using Metzenbaum scissors. The rotator cuff was readily identified and carefully inspected. The bursal surface appeared to be intact and without evidence of tearing. However, palpation demonstrated an area of softening/thinning of the anterior insertional fibers of the supraspinatus, consistent with an interstitial tear. Therefore, it was elected to apply a medium [place] patch over this area. This patch was secured using the appropriate bone and soft tissue staples.  The bicipital groove was identified by palpation and opened for 1-1.5 cm. The biceps tendon stump was retrieved through this defect. The floor of the bicipital groove was roughened with a curet before a single Biomet 2.9 mm JuggerKnot anchor was inserted. Both sets of sutures were passed through the biceps tendon and tied securely to effect the tenodesis. The bicipital sheath was reapproximated using two #0 Ethibond interrupted sutures, incorporating the biceps tendon to further reinforce the tenodesis.  The wound was copiously irrigated with sterile saline solution before the deltoid raphae was reapproximated using 2-0 Vicryl interrupted  sutures. The subcutaneous tissues were closed in two layers using 2-0 Vicryl interrupted sutures before the skin was closed using staples. The portal sites also were closed using staples. A sterile bulky dressing was applied to the shoulder before the arm was placed into a shoulder immobilizer. A Polar Care device also was applied to the shoulder. The patient was then awakened, extubated, and returned to the recovery room in satisfactory condition after tolerating the procedure well.

## 2019-07-28 NOTE — Anesthesia Post-op Follow-up Note (Signed)
Anesthesia QCDR form completed.        

## 2019-07-28 NOTE — Anesthesia Preprocedure Evaluation (Addendum)
Anesthesia Evaluation  Patient identified by MRN, date of birth, ID band Patient awake    Reviewed: Allergy & Precautions, H&P , NPO status , reviewed documented beta blocker date and time   Airway Mallampati: II  TM Distance: >3 FB Neck ROM: full    Dental  (+) Teeth Intact   Pulmonary    Pulmonary exam normal        Cardiovascular Normal cardiovascular exam     Neuro/Psych PSYCHIATRIC DISORDERS Anxiety Depression  Neuromuscular disease    GI/Hepatic GERD  Medicated and Controlled,  Endo/Other  Hypothyroidism   Renal/GU      Musculoskeletal   Abdominal   Peds  Hematology   Anesthesia Other Findings Past Medical History: No date: Allergic rhinitis No date: Anxiety No date: AVM (arteriovenous malformation) brain     Comment:  MRI'S SHOW CHRONIC HEMORRHAGE IN CEREBELLUM 2010: Clinically isolated syndrome of brainstem (HCC)     Comment:  Plaques on brainstem, followed by neurology No date: Elevated cholesterol No date: GERD (gastroesophageal reflux disease) No date: History of blood transfusion No date: Hypothyroidism No date: Low blood pressure No date: Pre-diabetes No date: Urinary incontinence     Comment:  After delivery of her recent baby Past Surgical History: No date: BLADDER SURGERY     Comment:  as a child No date: MOUTH SURGERY No date: NASAL SEPTUM SURGERY No date: REFRACTIVE SURGERY No date: TONSILLECTOMY BMI    Body Mass Index: 34.54 kg/m     Reproductive/Obstetrics                            Anesthesia Physical Anesthesia Plan  ASA: III  Anesthesia Plan: General   Post-op Pain Management:  Regional for Post-op pain   Induction: Intravenous  PONV Risk Score and Plan: 3 and Midazolam, Ondansetron and Dexamethasone  Airway Management Planned: Oral ETT  Additional Equipment:   Intra-op Plan:   Post-operative Plan: Extubation in OR  Informed Consent: I  have reviewed the patients History and Physical, chart, labs and discussed the procedure including the risks, benefits and alternatives for the proposed anesthesia with the patient or authorized representative who has indicated his/her understanding and acceptance.     Dental Advisory Given  Plan Discussed with: CRNA  Anesthesia Plan Comments:         Anesthesia Quick Evaluation

## 2019-07-28 NOTE — Anesthesia Procedure Notes (Signed)
Procedure Name: Intubation Date/Time: 07/28/2019 7:47 AM Performed by: Philbert Riser, CRNA Pre-anesthesia Checklist: Patient identified, Emergency Drugs available, Suction available, Patient being monitored and Timeout performed Patient Re-evaluated:Patient Re-evaluated prior to induction Oxygen Delivery Method: Circle system utilized and Simple face mask Preoxygenation: Pre-oxygenation with 100% oxygen Induction Type: IV induction Ventilation: Mask ventilation without difficulty Laryngoscope Size: Mac and 3 Grade View: Grade II Tube type: Oral Tube size: 7.0 mm Number of attempts: 1 Airway Equipment and Method: Stylet Placement Confirmation: ETT inserted through vocal cords under direct vision,  positive ETCO2 and breath sounds checked- equal and bilateral Secured at: 22 cm Tube secured with: Tape Dental Injury: Teeth and Oropharynx as per pre-operative assessment

## 2019-07-28 NOTE — Transfer of Care (Signed)
Immediate Anesthesia Transfer of Care Note  Patient: Stephanie Shields  Procedure(s) Performed: SHOULDER ARTHROSCOPY WITH, DEBIRDEMENT, SUBACROMIAL DECOMPRESSION, ROTATOR CUFF REPAIR (Left )  Patient Location: PACU  Anesthesia Type:General  Level of Consciousness: awake, alert  and oriented  Airway & Oxygen Therapy: Patient Spontanous Breathing and Patient connected to face mask oxygen  Post-op Assessment: Report given to RN and Post -op Vital signs reviewed and stable  Post vital signs: Reviewed and stable  Last Vitals:  Vitals Value Taken Time  BP 115/52 07/28/19 0938  Temp    Pulse 69 07/28/19 0939  Resp 21 07/28/19 0939  SpO2 96 % 07/28/19 0939  Vitals shown include unvalidated device data.  Last Pain:  Vitals:   07/28/19 0724  TempSrc:   PainSc: 0-No pain         Complications: No apparent anesthesia complications

## 2019-07-29 ENCOUNTER — Encounter: Payer: Self-pay | Admitting: Surgery

## 2019-08-03 ENCOUNTER — Ambulatory Visit (INDEPENDENT_AMBULATORY_CARE_PROVIDER_SITE_OTHER): Payer: BC Managed Care – PPO | Admitting: Psychology

## 2019-08-03 DIAGNOSIS — F4310 Post-traumatic stress disorder, unspecified: Secondary | ICD-10-CM

## 2019-08-03 DIAGNOSIS — F4323 Adjustment disorder with mixed anxiety and depressed mood: Secondary | ICD-10-CM | POA: Diagnosis not present

## 2019-08-03 DIAGNOSIS — F330 Major depressive disorder, recurrent, mild: Secondary | ICD-10-CM | POA: Diagnosis not present

## 2019-08-03 DIAGNOSIS — Z9889 Other specified postprocedural states: Secondary | ICD-10-CM | POA: Diagnosis not present

## 2019-08-04 NOTE — Anesthesia Postprocedure Evaluation (Signed)
Anesthesia Post Note  Patient: Stephanie Shields  Procedure(s) Performed: SHOULDER ARTHROSCOPY WITH, DEBIRDEMENT, SUBACROMIAL DECOMPRESSION, MINI OPEN ROTATOR CUFF REPAIR (Left ) MINI OPEN BICEPS TENODESIS  Patient location during evaluation: PACU Anesthesia Type: General Level of consciousness: awake and alert Pain management: pain level controlled (Good block) Vital Signs Assessment: post-procedure vital signs reviewed and stable Respiratory status: spontaneous breathing, nonlabored ventilation and respiratory function stable Cardiovascular status: blood pressure returned to baseline and stable Postop Assessment: no apparent nausea or vomiting Anesthetic complications: no     Last Vitals:  Vitals:   07/28/19 1119 07/28/19 1212  BP: 118/63 115/66  Pulse: (!) 57 (!) 59  Resp: 16 16  Temp:    SpO2: 100% 99%    Last Pain:  Vitals:   07/29/19 0809  TempSrc:   PainSc: 0-No pain                 Alphonsus Sias

## 2019-08-09 DIAGNOSIS — H93A1 Pulsatile tinnitus, right ear: Secondary | ICD-10-CM | POA: Diagnosis not present

## 2019-08-10 ENCOUNTER — Telehealth (HOSPITAL_COMMUNITY): Payer: Self-pay | Admitting: Radiology

## 2019-08-10 ENCOUNTER — Other Ambulatory Visit (HOSPITAL_COMMUNITY): Payer: Self-pay | Admitting: Interventional Radiology

## 2019-08-10 DIAGNOSIS — H93A9 Pulsatile tinnitus, unspecified ear: Secondary | ICD-10-CM

## 2019-08-10 DIAGNOSIS — Z9889 Other specified postprocedural states: Secondary | ICD-10-CM | POA: Diagnosis not present

## 2019-08-10 NOTE — Telephone Encounter (Signed)
Called pt, left VM for her to call to schedule cerebral angiogram with Deveshwar. JM

## 2019-08-11 ENCOUNTER — Other Ambulatory Visit: Payer: Self-pay | Admitting: Radiology

## 2019-08-12 ENCOUNTER — Ambulatory Visit (HOSPITAL_COMMUNITY)
Admission: RE | Admit: 2019-08-12 | Discharge: 2019-08-12 | Disposition: A | Discharge status: Home / Self Care | Payer: BC Managed Care – PPO | Source: Ambulatory Visit | Attending: Interventional Radiology | Admitting: Interventional Radiology

## 2019-08-12 ENCOUNTER — Other Ambulatory Visit: Payer: Self-pay | Admitting: Family Medicine

## 2019-08-12 ENCOUNTER — Other Ambulatory Visit (HOSPITAL_COMMUNITY): Payer: Self-pay | Admitting: Interventional Radiology

## 2019-08-12 ENCOUNTER — Telehealth: Payer: Self-pay

## 2019-08-12 ENCOUNTER — Other Ambulatory Visit: Payer: Self-pay

## 2019-08-12 DIAGNOSIS — Z79890 Hormone replacement therapy (postmenopausal): Secondary | ICD-10-CM | POA: Diagnosis not present

## 2019-08-12 DIAGNOSIS — H93A1 Pulsatile tinnitus, right ear: Secondary | ICD-10-CM | POA: Diagnosis not present

## 2019-08-12 DIAGNOSIS — R7303 Prediabetes: Secondary | ICD-10-CM | POA: Diagnosis not present

## 2019-08-12 DIAGNOSIS — Q282 Arteriovenous malformation of cerebral vessels: Secondary | ICD-10-CM | POA: Diagnosis not present

## 2019-08-12 DIAGNOSIS — H93A9 Pulsatile tinnitus, unspecified ear: Secondary | ICD-10-CM

## 2019-08-12 DIAGNOSIS — K219 Gastro-esophageal reflux disease without esophagitis: Secondary | ICD-10-CM | POA: Diagnosis not present

## 2019-08-12 DIAGNOSIS — Z79899 Other long term (current) drug therapy: Secondary | ICD-10-CM | POA: Diagnosis not present

## 2019-08-12 DIAGNOSIS — E039 Hypothyroidism, unspecified: Secondary | ICD-10-CM | POA: Diagnosis not present

## 2019-08-12 DIAGNOSIS — F419 Anxiety disorder, unspecified: Secondary | ICD-10-CM | POA: Diagnosis not present

## 2019-08-12 HISTORY — PX: IR ANGIO VERTEBRAL SEL VERTEBRAL BILAT MOD SED: IMG5369

## 2019-08-12 HISTORY — PX: IR ANGIOGRAM EXTREMITY LEFT: IMG651

## 2019-08-12 HISTORY — PX: IR ANGIO INTRA EXTRACRAN SEL COM CAROTID INNOMINATE BILAT MOD SED: IMG5360

## 2019-08-12 HISTORY — PX: IR US GUIDE VASC ACCESS RIGHT: IMG2390

## 2019-08-12 LAB — CBC WITH DIFFERENTIAL/PLATELET
Abs Immature Granulocytes: 0.02 10*3/uL (ref 0.00–0.07)
Basophils Absolute: 0 10*3/uL (ref 0.0–0.1)
Basophils Relative: 1 %
Eosinophils Absolute: 0.1 10*3/uL (ref 0.0–0.5)
Eosinophils Relative: 2 %
HCT: 40.6 % (ref 36.0–46.0)
Hemoglobin: 13.6 g/dL (ref 12.0–15.0)
Immature Granulocytes: 0 %
Lymphocytes Relative: 18 %
Lymphs Abs: 1.2 10*3/uL (ref 0.7–4.0)
MCH: 29.2 pg (ref 26.0–34.0)
MCHC: 33.5 g/dL (ref 30.0–36.0)
MCV: 87.1 fL (ref 80.0–100.0)
Monocytes Absolute: 0.5 10*3/uL (ref 0.1–1.0)
Monocytes Relative: 8 %
Neutro Abs: 4.8 10*3/uL (ref 1.7–7.7)
Neutrophils Relative %: 71 %
Platelets: 256 10*3/uL (ref 150–400)
RBC: 4.66 MIL/uL (ref 3.87–5.11)
RDW: 12.4 % (ref 11.5–15.5)
WBC: 6.7 10*3/uL (ref 4.0–10.5)
nRBC: 0 % (ref 0.0–0.2)

## 2019-08-12 LAB — BASIC METABOLIC PANEL
Anion gap: 9 (ref 5–15)
BUN: 17 mg/dL (ref 6–20)
CO2: 24 mmol/L (ref 22–32)
Calcium: 8.5 mg/dL — ABNORMAL LOW (ref 8.9–10.3)
Chloride: 103 mmol/L (ref 98–111)
Creatinine, Ser: 0.7 mg/dL (ref 0.44–1.00)
GFR calc Af Amer: >60 mL/min (ref >60)
GFR calc non Af Amer: >60 mL/min (ref >60)
Glucose, Bld: 95 mg/dL (ref 70–99)
Potassium: 5.6 mmol/L — ABNORMAL HIGH (ref 3.5–5.1)
Sodium: 136 mmol/L (ref 135–145)

## 2019-08-12 LAB — POCT I-STAT, CHEM 8
BUN: 18 mg/dL (ref 6–20)
Calcium, Ion: 1.25 mmol/L (ref 1.15–1.40)
Chloride: 102 mmol/L (ref 98–111)
Creatinine, Ser: 0.6 mg/dL (ref 0.44–1.00)
Glucose, Bld: 94 mg/dL (ref 70–99)
HCT: 38 % (ref 36.0–46.0)
Hemoglobin: 12.9 g/dL (ref 12.0–15.0)
Potassium: 4 mmol/L (ref 3.5–5.1)
Sodium: 142 mmol/L (ref 135–145)
TCO2: 22 mmol/L (ref 22–32)

## 2019-08-12 LAB — PREGNANCY, URINE: Preg Test, Ur: NEGATIVE

## 2019-08-12 MED ORDER — IOHEXOL 300 MG/ML  SOLN
150.0000 mL | Freq: Once | INTRAMUSCULAR | Status: AC | PRN
  Administered 2019-08-12: 75 mL via INTRA_ARTERIAL

## 2019-08-12 MED ORDER — ASPIRIN 325 MG PO TABS
ORAL_TABLET | ORAL | Status: AC
  Filled 2019-08-12: qty 1

## 2019-08-12 MED ORDER — LIDOCAINE HCL (PF) 1 % IJ SOLN
INTRAMUSCULAR | Status: AC | PRN
  Administered 2019-08-12: 10 mL

## 2019-08-12 MED ORDER — IOHEXOL 300 MG/ML  SOLN
50.0000 mL | Freq: Once | INTRAMUSCULAR | Status: AC | PRN
  Administered 2019-08-12: 35 mL via INTRA_ARTERIAL

## 2019-08-12 MED ORDER — SODIUM CHLORIDE 0.9 % IV SOLN: INTRAVENOUS | Status: AC

## 2019-08-12 MED ORDER — MIDAZOLAM HCL 2 MG/2ML IJ SOLN
INTRAMUSCULAR | Status: AC | PRN
  Administered 2019-08-12: 1 mg via INTRAVENOUS

## 2019-08-12 MED ORDER — NITROGLYCERIN 1 MG/10 ML FOR IR/CATH LAB
INTRA_ARTERIAL | Status: AC | PRN
  Administered 2019-08-12 (×2): 200 ug via INTRA_ARTERIAL

## 2019-08-12 MED ORDER — SODIUM CHLORIDE 0.9 % IV SOLN: INTRAVENOUS | Status: DC

## 2019-08-12 MED ORDER — VERAPAMIL HCL 2.5 MG/ML IV SOLN
INTRAVENOUS | Status: AC
  Filled 2019-08-12: qty 2

## 2019-08-12 MED ORDER — HEPARIN SODIUM (PORCINE) 1000 UNIT/ML IJ SOLN
INTRAMUSCULAR | Status: AC
  Filled 2019-08-12: qty 1

## 2019-08-12 MED ORDER — LIDOCAINE HCL 1 % IJ SOLN
INTRAMUSCULAR | Status: AC
  Filled 2019-08-12: qty 20

## 2019-08-12 MED ORDER — FENTANYL CITRATE (PF) 100 MCG/2ML IJ SOLN
INTRAMUSCULAR | Status: AC
  Filled 2019-08-12: qty 2

## 2019-08-12 MED ORDER — MIDAZOLAM HCL 2 MG/2ML IJ SOLN
INTRAMUSCULAR | Status: AC
  Filled 2019-08-12: qty 2

## 2019-08-12 MED ORDER — HEPARIN SODIUM (PORCINE) 1000 UNIT/ML IJ SOLN
INTRAMUSCULAR | Status: AC | PRN
  Administered 2019-08-12: 2000 [IU] via INTRA_ARTERIAL

## 2019-08-12 MED ORDER — VERAPAMIL HCL 2.5 MG/ML IV SOLN
INTRAVENOUS | Status: AC | PRN
  Administered 2019-08-12: 2.5 mg via INTRA_ARTERIAL

## 2019-08-12 MED ORDER — FENTANYL CITRATE (PF) 100 MCG/2ML IJ SOLN
INTRAMUSCULAR | Status: AC | PRN
  Administered 2019-08-12: 25 ug via INTRAVENOUS

## 2019-08-12 MED ORDER — NITROGLYCERIN 1 MG/10 ML FOR IR/CATH LAB
INTRA_ARTERIAL | Status: AC
  Filled 2019-08-12: qty 10

## 2019-08-12 NOTE — Procedures (Signed)
S/P bilateral common carotid and  Bilateral Vert A angiograms. RT rad approach.  Findings. 1 RT cerebellar DVA. (known) 2. Dural sinuses  widely patent. S.Codee Bloodworth MD

## 2019-08-12 NOTE — H&P (Signed)
Chief Complaint: Patient was seen in consultation today for right-sided pulsatile tinnitus/diagnostic cerebral arteriogram.  Referring Physician(s): Vaught, Jeannie Fend  Supervising Physician: Luanne Bras  Patient Status: [facility] - Out-pt  History of Present Illness: Stephanie Shields is a 48 y.o. female with a past medical history of hypotension, elevated cholesterol, right cerebellum cavernoma, GERD, pre-diabetes, hypothyroidism, left rotator cuff tear s/p surgical repair 07/2019, and anxiety. She was recently referred to ENT by her PCP for management of intermittent right-sided pulsatile tinnitus. She was seen by Dr. Pryor Ochoa who ordered appropriate imaging scans for further evaluation.  MR/MRA brain/head 07/27/2019: 1. Stable appearance of the brain when compared to multiple previous examinations. 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 No evidence of recent change or additional hemorrhage. Amount of associated hemosiderin appears stable. 2. There is a prominent draining vein from the right cerebellar developmental venous anomaly extending into the perimesencephalic cistern region on the right. MR angiography shows some signal within this, indicating that, though venous, this is a relatively high flow anomaly. It is possible that an arteriovenous fistula has developed over time, particularly if there is a change in symptoms. Consider neuro interventional referral. Catheter angiography may be necessary to take advantage of the time resolved features of that examination, which could differentiate fistula from baseline high-flow/rapid transit nature. 3. Bilateral mastoid effusions which could possibly be symptomatic.  NIR consulted by Dr. Pryor Ochoa for an image-guided diagnostic cerebral arteriogram to evaluate cause of right-sided pulsatile tinnitus (thought to be secondary  to possible DAVF seen on MRA). Patient awake and alert sitting in bed. Complains of intermittent right-sided pulsatile tinnitus that she describes as "a heartbeat". States that she currently does not hear this noise. Denies fever, chills, chest pain, dyspnea, abdominal pain, headache, dizziness, or vision changes.   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   Plaques on brainstem, followed by neurology   Elevated cholesterol    GERD (gastroesophageal reflux disease)    History of blood transfusion    Hypothyroidism    Low blood pressure    Pre-diabetes    Urinary incontinence    After delivery of her recent baby    Past Surgical History:  Procedure Laterality Date   BICEPT TENODESIS  07/28/2019   Procedure: MINI OPEN BICEPS TENODESIS;  Surgeon: Corky Mull, MD;  Location: [facility] ORS;  Service: Orthopedics;;   BLADDER SURGERY     as a child   MOUTH SURGERY     NASAL SEPTUM SURGERY     REFRACTIVE SURGERY     SHOULDER ARTHROSCOPY WITH SUBACROMIAL DECOMPRESSION, ROTATOR CUFF REPAIR AND BICEP TENDON REPAIR Left 07/28/2019   Procedure: SHOULDER ARTHROSCOPY WITH, DEBIRDEMENT, SUBACROMIAL DECOMPRESSION, MINI OPEN ROTATOR CUFF REPAIR;  Surgeon: Corky Mull, MD;  Location: [facility] ORS;  Service: Orthopedics;  Laterality: Left;   TONSILLECTOMY      Allergies: Sulfa antibiotics, Lobster [shellfish allergy], and Other  Medications: Prior to Admission medications   Medication Sig Start Date End Date Taking? Authorizing Provider  Cholecalciferol (VITAMIN D) 50 MCG (2000 UT) tablet Take 2,000 Units by mouth daily at 3 pm.   Yes [provider]  DEXILANT 60 MG capsule TAKE 1 CAPSULE BY MOUTH EVERY DAY Patient taking  differently: Take 60 mg by mouth daily.  07/21/19  Yes Leone Haven, MD    fluticasone (FLONASE) 50 MCG/ACT nasal spray Place 2 sprays into both nostrils daily.   Yes [provider]  ketotifen (ALAWAY) 0.025 % ophthalmic solution Place 1 drop into both eyes 2 (two) times daily as needed (allergies).   Yes [provider]  levonorgestrel (MIRENA) 20 MCG/24HR IUD 1 each by Intrauterine route once.   Yes [provider]  levothyroxine (SYNTHROID) 150 MCG tablet TAKE 1 TABLET (150 MCG TOTAL) BY MOUTH DAILY BEFORE BREAKFAST. 08/12/19 08/11/20 Yes Leone Haven, MD  meloxicam (MOBIC) 7.5 MG tablet Take 1 tablet (7.5 mg total) by mouth 2 (two) times daily after a meal. 07/28/19 07/27/20 Yes Poggi, Marshall Cork, MD  oxyCODONE (ROXICODONE) 5 MG immediate release tablet Take 1-2 tablets (5-10 mg total) by mouth every 4 (four) hours as needed. 07/28/19  Yes Poggi, Marshall Cork, MD     Family History  Problem Relation Age of Onset   Arthritis Other        Parent, Grandparent   Prostate cancer Other        Grandparent   Hyperlipidemia Other        Parent   Heart disease Other        Grandparent   Stroke Other        Grandparent   Diabetes Other        Parent    Social History   Socioeconomic History   Marital status: Married    Spouse name: Not on file   Number of children: Not on file   Years of education: Not on file   Highest education level: Not on file  Occupational History   Not on file  Social Needs   Financial resource strain: Not on file   Food insecurity    Worry: Not on file    Inability: Not on file   Transportation needs    Medical: Not on file    Non-medical: Not on file  Tobacco Use   Smoking status: Never Smoker   Smokeless tobacco: Never Used  Substance and Sexual Activity   Alcohol use: Yes    Alcohol/week: 0.0 standard drinks    Comment: rare   Drug use: No   Sexual activity: Not on file  Lifestyle   Physical activity    Days per week: Not on file    Minutes per session: Not on file    Stress: Not on file  Relationships   Social connections    Talks on phone: Not on file    Gets together: Not on file    Attends religious service: Not on file    Active member of club or organization: Not on file    Attends meetings of clubs or organizations: Not on file    Relationship status: Not on file  Other Topics Concern   Not on file  Social History Narrative   Not on file     Review of Systems: A 12 point ROS discussed and pertinent positives are indicated in the HPI above.  All other systems are negative.  Review of Systems  Constitutional: Negative for chills and fever.  Eyes: Negative for visual disturbance.  Respiratory: Negative for shortness of breath and wheezing.   Cardiovascular: Negative for chest pain and palpitations.  Gastrointestinal: Negative for abdominal pain.  Neurological: Negative for dizziness and headaches.  Psychiatric/Behavioral: Negative for behavioral problems and confusion.    Vital Signs:  BP (!) 131/59    Pulse (!) 51    Temp 98.3 F (36.8 C) (Oral)    Resp 18    Ht 5\' 6"  (1.676 m)    Wt 214 lb (97.1 kg)    SpO2 100%    BMI 34.54 kg/m   Physical Exam Vitals signs and nursing note reviewed.  Constitutional:      General: She is not in acute distress.    Appearance: Normal appearance.  Cardiovascular:     Rate and Rhythm: Normal rate and regular rhythm.     Heart sounds: Normal heart sounds. No murmur.  Pulmonary:     Effort: Pulmonary effort is normal. No respiratory distress.     Breath sounds: Normal breath sounds. No wheezing.  Musculoskeletal:     Comments: Left arm sling intact (post-op 07/28/2019).  Skin:    General: Skin is warm and dry.  Neurological:     Mental Status: She is alert and oriented to person, place, and time.  Psychiatric:        Mood and Affect: Mood normal.        Behavior: Behavior normal.        Thought Content: Thought content normal.        Judgment: Judgment normal.      MD  Evaluation Airway: WNL Heart: WNL Abdomen: WNL Chest/ Lungs: WNL ASA  Classification: 2 Mallampati/Airway Score: One   Imaging: Mr Angio Head Wo Contrast  Result Date: 07/27/2019 CLINICAL DATA:  Pulsatile tinnitus on the right EXAM: MRI HEAD WITHOUT AND WITH CONTRAST MRA HEAD WITHOUT CONTRAST TECHNIQUE: Multiplanar, multiecho pulse sequences of the brain and surrounding structures were obtained without and with intravenous contrast. Angiographic images of the head were obtained using MRA technique without contrast. CONTRAST:  9 cc Gadavist COMPARISON:  05/21/2018. Other prior films as distant as August 2010. FINDINGS: MRI HEAD FINDINGS Brain: Diffusion imaging does not show any acute or subacute infarction. The brainstem is normal. Chronic hemorrhagic developmental venous abnormalities of the right cerebellum are unchanged, with atypical cavernoma in the lateral anterior right cerebellum measuring 14 mm in diameter and a mixed developmental venous anomaly with components of venous angioma and cavernoma more inferiorly within the right cerebellum. I do not see evidence of a high flow component. Cerebral hemispheres are normal without evidence of stroke, mass or hemorrhage. No hydrocephalus or extra-axial collection. No abnormal brain enhancement occurs. No vestibular schwannoma. No extra-axial vascular lesion. Prominent draining vein from the developmental venous anomaly is present but unchanged. Vascular: No other vascular finding. Skull and upper cervical spine: Negative Sinuses/Orbits: Paranasal sinuses are clear. Bilateral mastoid effusions could possibly be symptomatic. Other: None MRA HEAD FINDINGS Both internal carotid arteries are widely patent into the brain. No siphon stenosis. The anterior and middle cerebral vessels are patent without proximal stenosis, aneurysm or vascular malformation. Both vertebral arteries are widely patent to the basilar. No basilar stenosis. Posterior circulation branch  vessels appear normal. There is some visible signal within the main central portions and draining veins of the right cerebellar DVA, consistent with a relatively high flow nature. IMPRESSION: Stable appearance of the brain when compared to multiple previous examinations. 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 No evidence of recent change or additional hemorrhage. Amount of associated hemosiderin appears stable. There is a prominent draining vein from the right cerebellar developmental venous anomaly extending into the perimesencephalic cistern  region on the right. MR angiography shows some signal within this, indicating that, though venous, this is a relatively high flow anomaly. It is possible that an arteriovenous fistula has developed over time, particularly if there is a change in symptoms. Consider neuro interventional referral. Catheter angiography may be necessary to take advantage of the time resolved features of that examination, which could differentiate fistula from baseline high-flow/rapid transit nature. Bilateral mastoid effusions which could possibly be symptomatic. Electronically Signed   By: Nelson Chimes M.D.   On: 07/27/2019 12:44   Mr [place]ina [place] [id number] Contrast  Result Date: 07/27/2019 CLINICAL DATA:  Pulsatile tinnitus on the right EXAM: MRI HEAD WITHOUT AND WITH CONTRAST MRA HEAD WITHOUT CONTRAST TECHNIQUE: Multiplanar, multiecho pulse sequences of the brain and surrounding structures were obtained without and with intravenous contrast. Angiographic images of the head were obtained using MRA technique without contrast. CONTRAST:  9 cc Gadavist COMPARISON:  05/21/2018. Other prior films as distant as August 2010. FINDINGS: MRI HEAD FINDINGS Brain: Diffusion imaging does not show any acute or subacute infarction. The brainstem is normal. Chronic hemorrhagic  developmental venous abnormalities of the right cerebellum are unchanged, with atypical cavernoma in the lateral anterior right cerebellum measuring 14 mm in diameter and a mixed developmental venous anomaly with components of venous angioma and cavernoma more inferiorly within the right cerebellum. I do not see evidence of a high flow component. Cerebral hemispheres are normal without evidence of stroke, mass or hemorrhage. No hydrocephalus or extra-axial collection. No abnormal brain enhancement occurs. No vestibular schwannoma. No extra-axial vascular lesion. Prominent draining vein from the developmental venous anomaly is present but unchanged. Vascular: No other vascular finding. Skull and upper cervical spine: Negative Sinuses/Orbits: Paranasal sinuses are clear. Bilateral mastoid effusions could possibly be symptomatic. Other: None MRA HEAD FINDINGS Both internal carotid arteries are widely patent into the brain. No siphon stenosis. The anterior and middle cerebral vessels are patent without proximal stenosis, aneurysm or vascular malformation. Both vertebral arteries are widely patent to the basilar. No basilar stenosis. Posterior circulation branch vessels appear normal. There is some visible signal within the main central portions and draining veins of the right cerebellar DVA, consistent with a relatively high flow nature. IMPRESSION: Stable appearance of the brain when compared to multiple previous examinations. 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 No evidence of recent change or additional hemorrhage. Amount of associated hemosiderin appears stable. There is a prominent draining vein from the right cerebellar developmental venous anomaly extending into the perimesencephalic cistern region on the right. MR angiography shows some signal within this, indicating that, though  venous, this is a relatively high flow anomaly. It is possible that an arteriovenous fistula has developed over time, particularly if there is a change in symptoms. Consider neuro interventional referral. Catheter angiography may be necessary to take advantage of the time resolved features of that examination, which could differentiate fistula from baseline high-flow/rapid transit nature. Bilateral mastoid effusions which could possibly be symptomatic. Electronically Signed   By: Nelson Chimes M.D.   On: 07/27/2019 12:44   [place] Or Nerve Block-image Only ([facility])  Result Date: 07/28/2019 There is no interpretation for this exam.  This order is for images obtained during a surgical procedure.  Please See "Surgeries" Tab for more information regarding the procedure.    Labs:  CBC: Recent Labs    07/21/19 0932 08/12/19 1030 08/12/19 1137  WBC 5.5 6.7  --   HGB 14.2 13.6 12.9  HCT 43.9 40.6 38.0  PLT 247 256  --     COAGS: No results for input(s): INR, APTT in the last 8760 hours.  BMP: Recent Labs    12/22/18 0935 08/12/19 1137  NA 140 142  K 4.3 4.0  CL 105 102  CO2 26  --   GLUCOSE 102* 94  BUN 16 18  CALCIUM 8.8  --   CREATININE 0.66 0.60    LIVER FUNCTION TESTS: Recent Labs    12/22/18 0935  BILITOT 0.5  AST 23  ALT 27  ALKPHOS 53  PROT 6.7  ALBUMIN 4.0     Assessment and Plan:  Right-sided pulsatile tinnitus. 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 Plan for image-guided diagnostic cerebral arteriogram today with Dr. Estanislado Pandy. Patient is NPO. Afebrile and WBCs WNL. She does not take blood thinners. INR pending.  Risks and benefits of cerebral arteriogram were discussed with the patient including, but not limited to bleeding, infection, vascular injury, stroke, or contrast induced renal failure. This interventional procedure involves the use of X-rays and because of the nature of the planned procedure, it is possible that we will have prolonged use of X-ray  fluoroscopy. Potential radiation risks to you include (but are not limited to) the following: - A slightly elevated risk for cancer  several years later in life. This risk is typically less than 0.5% percent. This risk is low in comparison to the normal incidence of human cancer, which is 33% for women and 50% for men according to the [place]. - Radiation induced injury can include skin redness, resembling a rash, tissue breakdown / ulcers and hair loss (which can be temporary or permanent).  The likelihood of either of these occurring depends on the difficulty of the procedure and whether you are sensitive to radiation due to previous procedures, disease, or genetic conditions.  IF your procedure requires a prolonged use of radiation, you will be notified and given written instructions for further action.  It is your responsibility to monitor the irradiated area for the 2 weeks following the procedure and to notify your physician if you are concerned that you have suffered a radiation induced injury.   All of the patient's questions were answered, patient is agreeable to proceed. Consent signed and in chart.   Thank you for this interesting consult.  I greatly enjoyed meeting Stephanie Shields and look forward to participating in their care.  A copy of this report was sent to the requesting provider on this date.  Electronically Signed: Earley Abide, PA-C 08/12/2019, 11:49 AM   I spent a total of 40 Minutes in face to face in clinical consultation, greater than 50% of which was counseling/coordinating care for right-sided pulsatile tinnitus/diagnostic cerebral arteriogram.

## 2019-08-12 NOTE — Telephone Encounter (Signed)
Noted. I am happy to place a referral to ENT though need to know the specific reason. Is it for her allergies?

## 2019-08-12 NOTE — Sedation Documentation (Signed)
Right radial arterial sheath removed. TR band applied on right wrist @ 1258 with 13cc of air

## 2019-08-12 NOTE — Telephone Encounter (Signed)
I called and left a message for the patient asking her why she needed the referral to ENT and I informed her to send a message to mychart with her reason.  Nina,cma

## 2019-08-12 NOTE — Progress Notes (Signed)
D/c instructions reviewed with Husband Eric via telephone.

## 2019-08-12 NOTE — Discharge Instructions (Signed)
Radial Site Care ° °This sheet gives you information about how to care for yourself after your procedure. Your health care provider may also give you more specific instructions. If you have problems or questions, contact your health care provider. °What can I expect after the procedure? °After the procedure, it is common to have: °· Bruising and tenderness at the catheter insertion area. °Follow these instructions at home: °Medicines °· Take over-the-counter and prescription medicines only as told by your health care provider. °Insertion site care °· Follow instructions from your health care provider about how to take care of your insertion site. Make sure you: °? Wash your hands with soap and water before you change your bandage (dressing). If soap and water are not available, use hand sanitizer. °? Change your dressing as told by your health care provider. °? Leave stitches (sutures), skin glue, or adhesive strips in place. These skin closures may need to stay in place for 2 weeks or longer. If adhesive strip edges start to loosen and curl up, you may trim the loose edges. Do not remove adhesive strips completely unless your health care provider tells you to do that. °· Check your insertion site every day for signs of infection. Check for: °? Redness, swelling, or pain. °? Fluid or blood. °? Pus or a bad smell. °? Warmth. °· Do not take baths, swim, or use a hot tub until your health care provider approves. °· You may shower 24-48 hours after the procedure, or as directed by your health care provider. °? Remove the dressing and gently wash the site with plain soap and water. °? Pat the area dry with a clean towel. °? Do not rub the site. That could cause bleeding. °· Do not apply powder or lotion to the site. °Activity ° °· For 24 hours after the procedure, or as directed by your health care provider: °? Do not flex or bend the affected arm. °? Do not push or pull heavy objects with the affected arm. °? Do not  drive yourself home from the hospital or clinic. You may drive 24 hours after the procedure unless your health care provider tells you not to. °? Do not operate machinery or power tools. °· Do not lift anything that is heavier than 10 lb (4.5 kg), or the limit that you are told, until your health care provider says that it is safe. °· Ask your health care provider when it is okay to: °? Return to work or school. °? Resume usual physical activities or sports. °? Resume sexual activity. °General instructions °· If the catheter site starts to bleed, raise your arm and put firm pressure on the site. If the bleeding does not stop, get help right away. This is a medical emergency. °· If you went home on the same day as your procedure, a responsible adult should be with you for the first 24 hours after you arrive home. °· Keep all follow-up visits as told by your health care provider. This is important. °Contact a health care provider if: °· You have a fever. °· You have redness, swelling, or yellow drainage around your insertion site. °Get help right away if: °· You have unusual pain at the radial site. °· The catheter insertion area swells very fast. °· The insertion area is bleeding, and the bleeding does not stop when you hold steady pressure on the area. °· Your arm or hand becomes pale, cool, tingly, or numb. °These symptoms may represent a serious problem   that is an emergency. Do not wait to see if the symptoms will go away. Get medical help right away. Call your local emergency services (911 in the [place] Do not drive yourself to the hospital. °Summary °· After the procedure, it is common to have bruising and tenderness at the site. °· Follow instructions from your health care provider about how to take care of your radial site wound. Check the wound every day for signs of infection. °· Do not lift anything that is heavier than 10 lb (4.5 kg), or the limit that you are told, until your health care provider says  that it is safe. °This information is not intended to replace advice given to you by your health care provider. Make sure you discuss any questions you have with your health care provider. °Document Released: 12/20/2010 Document Revised: 12/23/2017 Document Reviewed: 12/23/2017 °Elsevier Patient Education © 2020 [employer] ° °

## 2019-08-15 ENCOUNTER — Encounter (HOSPITAL_COMMUNITY): Payer: Self-pay | Admitting: Interventional Radiology

## 2019-08-15 ENCOUNTER — Telehealth: Payer: Self-pay | Admitting: Family Medicine

## 2019-08-15 NOTE — Telephone Encounter (Signed)
Please let the patient know that I received her sleep study results and it appears that she has sleep apnea. They recommended that she have an in lab sleep study and if she is willing to do that I can place and order, though if she does not want to do that we can get her set up with an autopap for her to use for her sleep apnea. Thanks.

## 2019-08-16 ENCOUNTER — Encounter (HOSPITAL_COMMUNITY): Payer: Self-pay

## 2019-08-16 ENCOUNTER — Other Ambulatory Visit (HOSPITAL_COMMUNITY): Payer: Self-pay | Admitting: Interventional Radiology

## 2019-08-16 DIAGNOSIS — Z9889 Other specified postprocedural states: Secondary | ICD-10-CM | POA: Diagnosis not present

## 2019-08-16 DIAGNOSIS — H93A9 Pulsatile tinnitus, unspecified ear: Secondary | ICD-10-CM

## 2019-08-17 ENCOUNTER — Ambulatory Visit (INDEPENDENT_AMBULATORY_CARE_PROVIDER_SITE_OTHER): Payer: BC Managed Care – PPO | Admitting: Psychology

## 2019-08-17 DIAGNOSIS — F4310 Post-traumatic stress disorder, unspecified: Secondary | ICD-10-CM | POA: Diagnosis not present

## 2019-08-17 DIAGNOSIS — F4323 Adjustment disorder with mixed anxiety and depressed mood: Secondary | ICD-10-CM | POA: Diagnosis not present

## 2019-08-23 DIAGNOSIS — Z9889 Other specified postprocedural states: Secondary | ICD-10-CM | POA: Diagnosis not present

## 2019-08-23 NOTE — Telephone Encounter (Signed)
I called the patient and she stated she did not need a ent referral at this time.   Nina,cma

## 2019-08-26 ENCOUNTER — Encounter: Payer: Self-pay | Admitting: Surgery

## 2019-08-30 ENCOUNTER — Other Ambulatory Visit: Payer: Self-pay

## 2019-08-30 DIAGNOSIS — Z20822 Contact with and (suspected) exposure to covid-19: Secondary | ICD-10-CM

## 2019-08-31 ENCOUNTER — Other Ambulatory Visit: Payer: Self-pay | Admitting: Optometry

## 2019-08-31 ENCOUNTER — Other Ambulatory Visit: Payer: Self-pay | Admitting: Family Medicine

## 2019-08-31 ENCOUNTER — Ambulatory Visit (INDEPENDENT_AMBULATORY_CARE_PROVIDER_SITE_OTHER): Payer: BC Managed Care – PPO | Admitting: Psychology

## 2019-08-31 DIAGNOSIS — F330 Major depressive disorder, recurrent, mild: Secondary | ICD-10-CM

## 2019-08-31 DIAGNOSIS — F4323 Adjustment disorder with mixed anxiety and depressed mood: Secondary | ICD-10-CM

## 2019-08-31 DIAGNOSIS — F4310 Post-traumatic stress disorder, unspecified: Secondary | ICD-10-CM | POA: Diagnosis not present

## 2019-08-31 DIAGNOSIS — Z1231 Encounter for screening mammogram for malignant neoplasm of breast: Secondary | ICD-10-CM

## 2019-08-31 LAB — NOVEL CORONAVIRUS, NAA: SARS-CoV-2, NAA: NOT DETECTED

## 2019-09-01 DIAGNOSIS — G4733 Obstructive sleep apnea (adult) (pediatric): Secondary | ICD-10-CM | POA: Diagnosis not present

## 2019-09-01 DIAGNOSIS — Z9889 Other specified postprocedural states: Secondary | ICD-10-CM | POA: Diagnosis not present

## 2019-09-08 ENCOUNTER — Ambulatory Visit
Admission: RE | Admit: 2019-09-08 | Discharge: 2019-09-08 | Disposition: A | Discharge status: Home / Self Care | Payer: BC Managed Care – PPO | Source: Ambulatory Visit | Attending: Family Medicine | Admitting: Family Medicine

## 2019-09-08 DIAGNOSIS — Z1231 Encounter for screening mammogram for malignant neoplasm of breast: Secondary | ICD-10-CM | POA: Diagnosis not present

## 2019-09-09 DIAGNOSIS — M25612 Stiffness of left shoulder, not elsewhere classified: Secondary | ICD-10-CM | POA: Diagnosis not present

## 2019-09-13 ENCOUNTER — Other Ambulatory Visit: Payer: Self-pay

## 2019-09-13 ENCOUNTER — Ambulatory Visit: Payer: Self-pay

## 2019-09-13 ENCOUNTER — Telehealth: Payer: Self-pay | Admitting: Family Medicine

## 2019-09-13 DIAGNOSIS — Z23 Encounter for immunization: Secondary | ICD-10-CM

## 2019-09-13 DIAGNOSIS — M25612 Stiffness of left shoulder, not elsewhere classified: Secondary | ICD-10-CM | POA: Diagnosis not present

## 2019-09-13 NOTE — Telephone Encounter (Signed)
It looks like the patient had a mammogram on 09/08/19. It has not been read yet. Could you call Stephanie Shields and see if it will be read soon? Thanks.

## 2019-09-14 ENCOUNTER — Ambulatory Visit (INDEPENDENT_AMBULATORY_CARE_PROVIDER_SITE_OTHER): Payer: BC Managed Care – PPO | Admitting: Psychology

## 2019-09-14 DIAGNOSIS — F4310 Post-traumatic stress disorder, unspecified: Secondary | ICD-10-CM

## 2019-09-14 DIAGNOSIS — F330 Major depressive disorder, recurrent, mild: Secondary | ICD-10-CM

## 2019-09-14 DIAGNOSIS — F4323 Adjustment disorder with mixed anxiety and depressed mood: Secondary | ICD-10-CM | POA: Diagnosis not present

## 2019-09-14 NOTE — Telephone Encounter (Signed)
I called [facility] and spoke with the receptionist and she stated that the patient had other Mammograms done in the past at [facility] and they were waiting on the results from them to come in to compare, as soon as they come in they will read the results.  Nina,cma

## 2019-09-14 NOTE — Telephone Encounter (Signed)
Noted  

## 2019-09-15 ENCOUNTER — Other Ambulatory Visit: Payer: Self-pay | Admitting: Family Medicine

## 2019-09-15 ENCOUNTER — Inpatient Hospital Stay
Admission: RE | Admit: 2019-09-15 | Discharge: 2019-09-15 | Disposition: A | Discharge status: Home / Self Care | Payer: Self-pay | Source: Ambulatory Visit | Attending: Family Medicine | Admitting: Family Medicine

## 2019-09-15 DIAGNOSIS — Z1231 Encounter for screening mammogram for malignant neoplasm of breast: Secondary | ICD-10-CM

## 2019-09-15 DIAGNOSIS — G35 Multiple sclerosis: Secondary | ICD-10-CM | POA: Diagnosis not present

## 2019-09-16 DIAGNOSIS — M25612 Stiffness of left shoulder, not elsewhere classified: Secondary | ICD-10-CM | POA: Diagnosis not present

## 2019-09-20 DIAGNOSIS — H93A1 Pulsatile tinnitus, right ear: Secondary | ICD-10-CM | POA: Insufficient documentation

## 2019-09-20 DIAGNOSIS — M25512 Pain in left shoulder: Secondary | ICD-10-CM | POA: Diagnosis not present

## 2019-09-20 DIAGNOSIS — R29898 Other symptoms and signs involving the musculoskeletal system: Secondary | ICD-10-CM | POA: Diagnosis not present

## 2019-09-20 DIAGNOSIS — Z9889 Other specified postprocedural states: Secondary | ICD-10-CM | POA: Diagnosis not present

## 2019-09-20 DIAGNOSIS — Q283 Other malformations of cerebral vessels: Secondary | ICD-10-CM | POA: Diagnosis not present

## 2019-09-20 DIAGNOSIS — M25612 Stiffness of left shoulder, not elsewhere classified: Secondary | ICD-10-CM | POA: Diagnosis not present

## 2019-09-20 DIAGNOSIS — H9311 Tinnitus, right ear: Secondary | ICD-10-CM | POA: Diagnosis not present

## 2019-09-22 DIAGNOSIS — M25612 Stiffness of left shoulder, not elsewhere classified: Secondary | ICD-10-CM | POA: Diagnosis not present

## 2019-09-27 ENCOUNTER — Encounter: Payer: Self-pay | Admitting: Surgery

## 2019-09-27 DIAGNOSIS — M25612 Stiffness of left shoulder, not elsewhere classified: Secondary | ICD-10-CM | POA: Diagnosis not present

## 2019-09-28 ENCOUNTER — Ambulatory Visit (INDEPENDENT_AMBULATORY_CARE_PROVIDER_SITE_OTHER): Payer: BC Managed Care – PPO | Admitting: Psychology

## 2019-09-28 DIAGNOSIS — F4323 Adjustment disorder with mixed anxiety and depressed mood: Secondary | ICD-10-CM

## 2019-09-28 DIAGNOSIS — F330 Major depressive disorder, recurrent, mild: Secondary | ICD-10-CM | POA: Diagnosis not present

## 2019-09-28 DIAGNOSIS — F4310 Post-traumatic stress disorder, unspecified: Secondary | ICD-10-CM

## 2019-09-29 DIAGNOSIS — M25612 Stiffness of left shoulder, not elsewhere classified: Secondary | ICD-10-CM | POA: Diagnosis not present

## 2019-10-02 DIAGNOSIS — G4733 Obstructive sleep apnea (adult) (pediatric): Secondary | ICD-10-CM | POA: Diagnosis not present

## 2019-10-05 DIAGNOSIS — M25612 Stiffness of left shoulder, not elsewhere classified: Secondary | ICD-10-CM | POA: Diagnosis not present

## 2019-10-11 DIAGNOSIS — M25612 Stiffness of left shoulder, not elsewhere classified: Secondary | ICD-10-CM | POA: Diagnosis not present

## 2019-10-12 ENCOUNTER — Ambulatory Visit (INDEPENDENT_AMBULATORY_CARE_PROVIDER_SITE_OTHER): Payer: BC Managed Care – PPO | Admitting: Psychology

## 2019-10-12 DIAGNOSIS — F330 Major depressive disorder, recurrent, mild: Secondary | ICD-10-CM | POA: Diagnosis not present

## 2019-10-12 DIAGNOSIS — F4310 Post-traumatic stress disorder, unspecified: Secondary | ICD-10-CM | POA: Diagnosis not present

## 2019-10-12 DIAGNOSIS — F4323 Adjustment disorder with mixed anxiety and depressed mood: Secondary | ICD-10-CM

## 2019-10-18 DIAGNOSIS — M25612 Stiffness of left shoulder, not elsewhere classified: Secondary | ICD-10-CM | POA: Diagnosis not present

## 2019-10-19 DIAGNOSIS — Q283 Other malformations of cerebral vessels: Secondary | ICD-10-CM | POA: Diagnosis not present

## 2019-10-19 DIAGNOSIS — D1800 Hemangioma unspecified site: Secondary | ICD-10-CM | POA: Diagnosis not present

## 2019-10-21 DIAGNOSIS — M25612 Stiffness of left shoulder, not elsewhere classified: Secondary | ICD-10-CM | POA: Diagnosis not present

## 2019-10-25 DIAGNOSIS — M25612 Stiffness of left shoulder, not elsewhere classified: Secondary | ICD-10-CM | POA: Diagnosis not present

## 2019-10-26 ENCOUNTER — Ambulatory Visit (INDEPENDENT_AMBULATORY_CARE_PROVIDER_SITE_OTHER): Payer: BC Managed Care – PPO | Admitting: Psychology

## 2019-10-26 DIAGNOSIS — F4310 Post-traumatic stress disorder, unspecified: Secondary | ICD-10-CM

## 2019-10-26 DIAGNOSIS — F4323 Adjustment disorder with mixed anxiety and depressed mood: Secondary | ICD-10-CM

## 2019-10-26 DIAGNOSIS — F330 Major depressive disorder, recurrent, mild: Secondary | ICD-10-CM | POA: Diagnosis not present

## 2019-10-31 DIAGNOSIS — M75112 Incomplete rotator cuff tear or rupture of left shoulder, not specified as traumatic: Secondary | ICD-10-CM | POA: Diagnosis not present

## 2019-10-31 DIAGNOSIS — M7522 Bicipital tendinitis, left shoulder: Secondary | ICD-10-CM | POA: Diagnosis not present

## 2019-10-31 DIAGNOSIS — M25612 Stiffness of left shoulder, not elsewhere classified: Secondary | ICD-10-CM | POA: Diagnosis not present

## 2019-10-31 DIAGNOSIS — M7582 Other shoulder lesions, left shoulder: Secondary | ICD-10-CM | POA: Diagnosis not present

## 2019-11-01 DIAGNOSIS — G4733 Obstructive sleep apnea (adult) (pediatric): Secondary | ICD-10-CM | POA: Diagnosis not present

## 2019-11-02 DIAGNOSIS — G4733 Obstructive sleep apnea (adult) (pediatric): Secondary | ICD-10-CM | POA: Diagnosis not present

## 2019-11-03 DIAGNOSIS — M25612 Stiffness of left shoulder, not elsewhere classified: Secondary | ICD-10-CM | POA: Diagnosis not present

## 2019-11-09 ENCOUNTER — Ambulatory Visit (INDEPENDENT_AMBULATORY_CARE_PROVIDER_SITE_OTHER): Payer: BC Managed Care – PPO | Admitting: Psychology

## 2019-11-09 DIAGNOSIS — F4323 Adjustment disorder with mixed anxiety and depressed mood: Secondary | ICD-10-CM | POA: Diagnosis not present

## 2019-11-09 DIAGNOSIS — F4310 Post-traumatic stress disorder, unspecified: Secondary | ICD-10-CM | POA: Diagnosis not present

## 2019-11-09 DIAGNOSIS — M25612 Stiffness of left shoulder, not elsewhere classified: Secondary | ICD-10-CM | POA: Diagnosis not present

## 2019-11-14 DIAGNOSIS — M25612 Stiffness of left shoulder, not elsewhere classified: Secondary | ICD-10-CM | POA: Diagnosis not present

## 2019-11-17 DIAGNOSIS — M25612 Stiffness of left shoulder, not elsewhere classified: Secondary | ICD-10-CM | POA: Diagnosis not present

## 2019-11-22 DIAGNOSIS — M25612 Stiffness of left shoulder, not elsewhere classified: Secondary | ICD-10-CM | POA: Diagnosis not present

## 2019-11-23 ENCOUNTER — Ambulatory Visit: Payer: BC Managed Care – PPO | Admitting: Psychology

## 2019-11-29 DIAGNOSIS — M25612 Stiffness of left shoulder, not elsewhere classified: Secondary | ICD-10-CM | POA: Diagnosis not present

## 2019-11-30 DIAGNOSIS — H02883 Meibomian gland dysfunction of right eye, unspecified eyelid: Secondary | ICD-10-CM | POA: Diagnosis not present

## 2019-12-01 DIAGNOSIS — G4733 Obstructive sleep apnea (adult) (pediatric): Secondary | ICD-10-CM | POA: Diagnosis not present

## 2019-12-02 DIAGNOSIS — G4733 Obstructive sleep apnea (adult) (pediatric): Secondary | ICD-10-CM | POA: Diagnosis not present

## 2019-12-07 ENCOUNTER — Ambulatory Visit (INDEPENDENT_AMBULATORY_CARE_PROVIDER_SITE_OTHER): Payer: Self-pay | Admitting: Psychology

## 2019-12-07 DIAGNOSIS — F4310 Post-traumatic stress disorder, unspecified: Secondary | ICD-10-CM

## 2019-12-07 DIAGNOSIS — M25612 Stiffness of left shoulder, not elsewhere classified: Secondary | ICD-10-CM | POA: Diagnosis not present

## 2019-12-07 DIAGNOSIS — F4323 Adjustment disorder with mixed anxiety and depressed mood: Secondary | ICD-10-CM

## 2019-12-09 DIAGNOSIS — M25612 Stiffness of left shoulder, not elsewhere classified: Secondary | ICD-10-CM | POA: Diagnosis not present

## 2019-12-13 DIAGNOSIS — M25612 Stiffness of left shoulder, not elsewhere classified: Secondary | ICD-10-CM | POA: Diagnosis not present

## 2019-12-16 DIAGNOSIS — M25612 Stiffness of left shoulder, not elsewhere classified: Secondary | ICD-10-CM | POA: Diagnosis not present

## 2019-12-20 DIAGNOSIS — M25612 Stiffness of left shoulder, not elsewhere classified: Secondary | ICD-10-CM | POA: Diagnosis not present

## 2019-12-21 ENCOUNTER — Ambulatory Visit: Payer: BC Managed Care – PPO | Admitting: Psychology

## 2019-12-22 DIAGNOSIS — M25612 Stiffness of left shoulder, not elsewhere classified: Secondary | ICD-10-CM | POA: Diagnosis not present

## 2019-12-27 ENCOUNTER — Encounter: Payer: Self-pay | Admitting: Family Medicine

## 2019-12-27 DIAGNOSIS — M25612 Stiffness of left shoulder, not elsewhere classified: Secondary | ICD-10-CM | POA: Diagnosis not present

## 2019-12-27 DIAGNOSIS — R0681 Apnea, not elsewhere classified: Secondary | ICD-10-CM

## 2019-12-28 ENCOUNTER — Telehealth: Payer: Self-pay | Admitting: *Deleted

## 2019-12-28 NOTE — Telephone Encounter (Signed)
Would need to write a DME order for the part have him sign and try a PA by calling insurance probably will not work.

## 2019-12-28 NOTE — Telephone Encounter (Signed)
error 

## 2019-12-30 DIAGNOSIS — M75112 Incomplete rotator cuff tear or rupture of left shoulder, not specified as traumatic: Secondary | ICD-10-CM | POA: Diagnosis not present

## 2019-12-30 DIAGNOSIS — M7522 Bicipital tendinitis, left shoulder: Secondary | ICD-10-CM | POA: Diagnosis not present

## 2019-12-30 DIAGNOSIS — M25612 Stiffness of left shoulder, not elsewhere classified: Secondary | ICD-10-CM | POA: Diagnosis not present

## 2019-12-30 DIAGNOSIS — M7582 Other shoulder lesions, left shoulder: Secondary | ICD-10-CM | POA: Diagnosis not present

## 2020-01-02 ENCOUNTER — Ambulatory Visit: Payer: BC Managed Care – PPO | Admitting: Family Medicine

## 2020-01-02 DIAGNOSIS — G4733 Obstructive sleep apnea (adult) (pediatric): Secondary | ICD-10-CM | POA: Diagnosis not present

## 2020-01-04 ENCOUNTER — Ambulatory Visit (INDEPENDENT_AMBULATORY_CARE_PROVIDER_SITE_OTHER): Payer: BC Managed Care – PPO | Admitting: Psychology

## 2020-01-04 DIAGNOSIS — F4310 Post-traumatic stress disorder, unspecified: Secondary | ICD-10-CM | POA: Diagnosis not present

## 2020-01-04 DIAGNOSIS — F4323 Adjustment disorder with mixed anxiety and depressed mood: Secondary | ICD-10-CM | POA: Diagnosis not present

## 2020-01-10 ENCOUNTER — Telehealth: Payer: Self-pay

## 2020-01-10 NOTE — Telephone Encounter (Signed)
LVM calling patient to review her medications with her prior to her tomorrows virtual visit with Dr. Marius Ditch.  Thanks,  [place], [place]

## 2020-01-11 ENCOUNTER — Other Ambulatory Visit: Payer: Self-pay

## 2020-01-11 ENCOUNTER — Encounter: Payer: Self-pay | Admitting: Gastroenterology

## 2020-01-11 ENCOUNTER — Ambulatory Visit (INDEPENDENT_AMBULATORY_CARE_PROVIDER_SITE_OTHER): Payer: BC Managed Care – PPO | Admitting: Gastroenterology

## 2020-01-11 DIAGNOSIS — R194 Change in bowel habit: Secondary | ICD-10-CM

## 2020-01-11 DIAGNOSIS — K2100 Gastro-esophageal reflux disease with esophagitis, without bleeding: Secondary | ICD-10-CM

## 2020-01-11 MED ORDER — NA SULFATE-K SULFATE-MG SULF 17.5-3.13-1.6 GM/177ML PO SOLN: 1.0000 | Freq: Once | ORAL | 0 refills | Status: AC

## 2020-01-11 NOTE — Progress Notes (Signed)
Stephanie Sear, MD [address]  [place], [place] [address]  Main: [phone]  Fax: [phone]    Gastroenterology Consultation Video Visit  Referring Provider:     Leone Haven, MD Primary Care Physician:  Leone Haven, MD Primary Gastroenterologist:  Dr. Cephas Darby Reason for Consultation:   Change in bowel habits, chronic GERD        HPI:   Stephanie Shields is a 49 y.o. female referred by Dr. Caryl Bis, Angela Adam, MD  for consultation & management of change in bowel habits, chronic GERD  Virtual Visit Video Note  I connected with Stephanie Shields on 01/11/20 at  8:45 AM EST by video and verified that I am speaking with the correct person using two identifiers.   I discussed the limitations, risks, security and privacy concerns of performing an evaluation and management service by video and the availability of in person appointments. I also discussed with the patient that there may be a patient responsible charge related to this service. The patient expressed understanding and agreed to proceed.  Location of the Patient: Home  Location of the provider: Office  Persons participating in the visit: Patient and provider only   History of Present Illness:  [name] was originally seen by me in 01/2019 for chronic GERD and history of esophagitis.  She was on Prilosec at that time and later transitioned to Dexilant 60 mg daily which she was taking it for Prilosec.  Due to pandemic, EGD was postponed.  She wanted to connect and readdress about the EGD.  She also noticed that for the last 1 year, she has been experiencing soft mushy bowel movements, sensation of incomplete emptying, feeling full, bloating.  She denies rectal bleeding or abdominal pain.  She may have up to 2 bowel movements a day.  She used to eat out before pandemic, has been eating mostly homemade food.  She denies carbonated beverages, use of artificial sweeteners.  She may have gained few  pounds staying at home.  She does not smoke or drink alcohol She could not identify any particular dietary triggers She certainly thinks last year has been more stressful compared to previous years  NSAIDs: None  Antiplts/Anticoagulants/Anti thrombotics: None  GI Procedures: EGD in 05/2014 revealed esophagitis No esophageal biopsies were performed.  There was no evidence of H. pylori and gastric biopsies.  She denies family history of GI malignancy  Past Medical History:  Diagnosis Date  . Allergic rhinitis   . Anxiety   . AVM (arteriovenous malformation) brain    MRI'S SHOW CHRONIC HEMORRHAGE IN CEREBELLUM  . 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 Elevated cholesterol   . GERD (gastroesophageal reflux disease)   . History of blood transfusion   . Hypothyroidism   . Low blood pressure   . Pre-diabetes   . Urinary incontinence    After delivery of her recent baby    Past Surgical History:  Procedure Laterality Date  . BICEPT TENODESIS  07/28/2019   Procedure: MINI OPEN BICEPS TENODESIS;  Surgeon: Corky Mull, MD;  Location: [facility] ORS;  Service: Orthopedics;;  . BLADDER SURGERY     as a child  . IR ANGIO INTRA EXTRACRAN SEL COM CAROTID INNOMINATE BILAT MOD SED  08/12/2019  . IR ANGIO VERTEBRAL SEL VERTEBRAL BILAT MOD SED  08/12/2019  . IR ANGIOGRAM EXTREMITY LEFT  08/12/2019  . IR  US GUIDE VASC ACCESS RIGHT  08/12/2019  . MOUTH SURGERY    . NASAL SEPTUM SURGERY    . REFRACTIVE SURGERY    . SHOULDER ARTHROSCOPY WITH SUBACROMIAL DECOMPRESSION, ROTATOR CUFF REPAIR AND BICEP TENDON REPAIR Left 07/28/2019   Procedure: SHOULDER ARTHROSCOPY WITH, DEBIRDEMENT, SUBACROMIAL DECOMPRESSION, MINI OPEN ROTATOR CUFF REPAIR;  Surgeon: Corky Mull, MD;  Location: [facility] ORS;  Service: Orthopedics;  Laterality: Left;  . TONSILLECTOMY      Current Outpatient Medications:  .  Cholecalciferol (VITAMIN D) 50 MCG (2000 UT) tablet,  Take 2,000 Units by mouth daily at 3 pm., Disp: , Rfl:  .  DEXILANT 60 MG capsule, TAKE 1 CAPSULE BY MOUTH EVERY DAY (Patient taking differently: Take 60 mg by mouth daily. ), Disp: 90 capsule, Rfl: 1 .  levonorgestrel (MIRENA) 20 MCG/24HR IUD, 1 each by Intrauterine route once., Disp: , Rfl:  .  levothyroxine (SYNTHROID) 150 MCG tablet, TAKE 1 TABLET (150 MCG TOTAL) BY MOUTH DAILY BEFORE BREAKFAST., Disp: 90 tablet, Rfl: 3   Family History  Problem Relation Age of Onset  . Arthritis Other        Parent, Grandparent  . Prostate cancer Other        Grandparent  . Hyperlipidemia Other        Parent  . Heart disease Other        Grandparent  . Stroke Other        Grandparent  . Diabetes Other        Parent     Social History   Tobacco Use  . Smoking status: Never Smoker  . Smokeless tobacco: Never Used  Substance Use Topics  . Alcohol use: Yes    Alcohol/week: 0.0 standard drinks    Comment: rare  . Drug use: No    Allergies as of 01/11/2020 - Review Complete 08/12/2019  Allergen Reaction Noted  . Sulfa antibiotics Nausea And Vomiting 04/04/2013  . Lobster [shellfish allergy] Nausea And Vomiting 07/21/2019  . Other Nausea And Vomiting 07/21/2019     Imaging Studies: No abdominal imaging  Assessment and Plan:   Stephanie Shields is a 83 y.o. pleasant Caucasian female with hypothyroidism, chronic GERD maintained on PPI, history of esophagitis seen for follow-up of chronic GERD and change in bowel habits  Chronic GERD Continue Dexilant 60 mg daily Recommend EGD with esophageal biopsies Continue antireflux lifestyle  Change in bowel habits Normal TSH levels Given her age, recommend diagnostic colonoscopy and patient is agreeable Trial of probiotics, discussed  I have discussed alternative options, risks & benefits,  which include, but are not limited to, bleeding, infection, perforation,respiratory complication & drug reaction.  The patient agrees with this plan &  written consent will be obtained.      Follow Up Instructions:   I discussed the assessment and treatment plan with the patient. The patient was provided an opportunity to ask questions and all were answered. The patient agreed with the plan and demonstrated an understanding of the instructions.   The patient was advised to call back or seek an in-person evaluation if the symptoms worsen or if the condition fails to improve as anticipated.  I provided 15 minutes of face-to-face time during this encounter.   Follow up based on the above work-up   Cephas Darby, MD

## 2020-01-17 ENCOUNTER — Other Ambulatory Visit: Payer: Self-pay | Admitting: Family Medicine

## 2020-01-18 ENCOUNTER — Ambulatory Visit (INDEPENDENT_AMBULATORY_CARE_PROVIDER_SITE_OTHER): Payer: BC Managed Care – PPO | Admitting: Psychology

## 2020-01-18 DIAGNOSIS — F4323 Adjustment disorder with mixed anxiety and depressed mood: Secondary | ICD-10-CM | POA: Diagnosis not present

## 2020-01-18 DIAGNOSIS — F4310 Post-traumatic stress disorder, unspecified: Secondary | ICD-10-CM | POA: Diagnosis not present

## 2020-01-23 ENCOUNTER — Telehealth: Payer: Self-pay | Admitting: Family Medicine

## 2020-01-23 NOTE — Telephone Encounter (Signed)
Pt needs Prior auth for nasal pillows for CPAP machine. Send to [facility] and call pt

## 2020-01-30 ENCOUNTER — Other Ambulatory Visit: Payer: Self-pay

## 2020-01-30 ENCOUNTER — Other Ambulatory Visit
Admission: RE | Admit: 2020-01-30 | Discharge: 2020-01-30 | Disposition: A | Discharge status: Home / Self Care | Payer: BC Managed Care – PPO | Source: Ambulatory Visit | Attending: Gastroenterology | Admitting: Gastroenterology

## 2020-01-30 DIAGNOSIS — Z20822 Contact with and (suspected) exposure to covid-19: Secondary | ICD-10-CM | POA: Insufficient documentation

## 2020-01-30 DIAGNOSIS — Z01812 Encounter for preprocedural laboratory examination: Secondary | ICD-10-CM | POA: Insufficient documentation

## 2020-01-30 DIAGNOSIS — G4733 Obstructive sleep apnea (adult) (pediatric): Secondary | ICD-10-CM | POA: Diagnosis not present

## 2020-01-30 LAB — SARS CORONAVIRUS 2 (TAT 6-24 HRS): SARS Coronavirus 2: NEGATIVE

## 2020-02-01 ENCOUNTER — Encounter
Admission: RE | Disposition: A | Discharge status: Home / Self Care | Payer: Self-pay | Source: Home / Self Care | Attending: Gastroenterology

## 2020-02-01 ENCOUNTER — Ambulatory Visit: Payer: BC Managed Care – PPO | Admitting: Certified Registered"

## 2020-02-01 ENCOUNTER — Other Ambulatory Visit: Payer: Self-pay

## 2020-02-01 ENCOUNTER — Ambulatory Visit: Payer: BC Managed Care – PPO | Admitting: Psychology

## 2020-02-01 ENCOUNTER — Encounter: Payer: Self-pay | Admitting: Gastroenterology

## 2020-02-01 ENCOUNTER — Ambulatory Visit
Admission: RE | Admit: 2020-02-01 | Discharge: 2020-02-01 | Disposition: A | Discharge status: Home / Self Care | Payer: BC Managed Care – PPO | Attending: Gastroenterology | Admitting: Gastroenterology

## 2020-02-01 DIAGNOSIS — K2100 Gastro-esophageal reflux disease with esophagitis, without bleeding: Secondary | ICD-10-CM

## 2020-02-01 DIAGNOSIS — R7303 Prediabetes: Secondary | ICD-10-CM | POA: Diagnosis not present

## 2020-02-01 DIAGNOSIS — D128 Benign neoplasm of rectum: Secondary | ICD-10-CM | POA: Diagnosis not present

## 2020-02-01 DIAGNOSIS — D123 Benign neoplasm of transverse colon: Secondary | ICD-10-CM | POA: Diagnosis not present

## 2020-02-01 DIAGNOSIS — E039 Hypothyroidism, unspecified: Secondary | ICD-10-CM | POA: Insufficient documentation

## 2020-02-01 DIAGNOSIS — K219 Gastro-esophageal reflux disease without esophagitis: Secondary | ICD-10-CM | POA: Diagnosis not present

## 2020-02-01 DIAGNOSIS — R194 Change in bowel habit: Secondary | ICD-10-CM

## 2020-02-01 DIAGNOSIS — K635 Polyp of colon: Secondary | ICD-10-CM | POA: Diagnosis not present

## 2020-02-01 DIAGNOSIS — Z833 Family history of diabetes mellitus: Secondary | ICD-10-CM | POA: Diagnosis not present

## 2020-02-01 DIAGNOSIS — K621 Rectal polyp: Secondary | ICD-10-CM | POA: Diagnosis not present

## 2020-02-01 DIAGNOSIS — K317 Polyp of stomach and duodenum: Secondary | ICD-10-CM | POA: Insufficient documentation

## 2020-02-01 DIAGNOSIS — Z8042 Family history of malignant neoplasm of prostate: Secondary | ICD-10-CM | POA: Diagnosis not present

## 2020-02-01 DIAGNOSIS — Z79899 Other long term (current) drug therapy: Secondary | ICD-10-CM | POA: Insufficient documentation

## 2020-02-01 DIAGNOSIS — F419 Anxiety disorder, unspecified: Secondary | ICD-10-CM | POA: Insufficient documentation

## 2020-02-01 DIAGNOSIS — Z823 Family history of stroke: Secondary | ICD-10-CM | POA: Insufficient documentation

## 2020-02-01 DIAGNOSIS — Z8261 Family history of arthritis: Secondary | ICD-10-CM | POA: Diagnosis not present

## 2020-02-01 DIAGNOSIS — I959 Hypotension, unspecified: Secondary | ICD-10-CM | POA: Diagnosis not present

## 2020-02-01 DIAGNOSIS — G709 Myoneural disorder, unspecified: Secondary | ICD-10-CM | POA: Diagnosis not present

## 2020-02-01 DIAGNOSIS — Z8249 Family history of ischemic heart disease and other diseases of the circulatory system: Secondary | ICD-10-CM | POA: Insufficient documentation

## 2020-02-01 DIAGNOSIS — Z793 Long term (current) use of hormonal contraceptives: Secondary | ICD-10-CM | POA: Insufficient documentation

## 2020-02-01 DIAGNOSIS — R32 Unspecified urinary incontinence: Secondary | ICD-10-CM | POA: Insufficient documentation

## 2020-02-01 DIAGNOSIS — Z8349 Family history of other endocrine, nutritional and metabolic diseases: Secondary | ICD-10-CM | POA: Insufficient documentation

## 2020-02-01 DIAGNOSIS — Q282 Arteriovenous malformation of cerebral vessels: Secondary | ICD-10-CM | POA: Insufficient documentation

## 2020-02-01 DIAGNOSIS — G4730 Sleep apnea, unspecified: Secondary | ICD-10-CM | POA: Diagnosis not present

## 2020-02-01 DIAGNOSIS — E7800 Pure hypercholesterolemia, unspecified: Secondary | ICD-10-CM | POA: Insufficient documentation

## 2020-02-01 HISTORY — PX: COLONOSCOPY WITH PROPOFOL: SHX5780

## 2020-02-01 HISTORY — PX: ESOPHAGOGASTRODUODENOSCOPY (EGD) WITH PROPOFOL: SHX5813

## 2020-02-01 LAB — POCT PREGNANCY, URINE: Preg Test, Ur: NEGATIVE

## 2020-02-01 SURGERY — COLONOSCOPY WITH PROPOFOL
Anesthesia: General

## 2020-02-01 MED ORDER — PROPOFOL 10 MG/ML IV BOLUS
INTRAVENOUS | Status: DC | PRN
  Administered 2020-02-01: 70 mg via INTRAVENOUS
  Administered 2020-02-01: 10 mg via INTRAVENOUS

## 2020-02-01 MED ORDER — GLYCOPYRROLATE 0.2 MG/ML IJ SOLN
INTRAMUSCULAR | Status: DC | PRN
  Administered 2020-02-01 (×2): .2 mg via INTRAVENOUS

## 2020-02-01 MED ORDER — SODIUM CHLORIDE 0.9 % IV SOLN: INTRAVENOUS | Status: DC

## 2020-02-01 MED ORDER — PROPOFOL 500 MG/50ML IV EMUL
INTRAVENOUS | Status: DC | PRN
  Administered 2020-02-01: 165 ug/kg/min via INTRAVENOUS

## 2020-02-01 MED ORDER — LIDOCAINE HCL (CARDIAC) PF 100 MG/5ML IV SOSY
PREFILLED_SYRINGE | INTRAVENOUS | Status: DC | PRN
  Administered 2020-02-01: 100 mg via INTRATRACHEAL

## 2020-02-01 NOTE — Anesthesia Preprocedure Evaluation (Signed)
Anesthesia Evaluation  Patient identified by MRN, date of birth, ID band Patient awake    Reviewed: Allergy & Precautions, H&P , NPO status , Patient's Chart, lab work & pertinent test results  History of Anesthesia Complications Negative for: history of anesthetic complications  Airway Mallampati: I  TM Distance: <3 FB Neck ROM: full    Dental  (+) Chipped   Pulmonary sleep apnea ,           Cardiovascular Exercise Tolerance: Good (-) angina(-) Past MI and (-) DOE negative cardio ROS       Neuro/Psych PSYCHIATRIC DISORDERS  Neuromuscular disease    GI/Hepatic Neg liver ROS, GERD  Medicated and Controlled,  Endo/Other  negative endocrine ROSHypothyroidism   Renal/GU negative Renal ROS  negative genitourinary   Musculoskeletal   Abdominal   Peds  Hematology negative hematology ROS (+)   Anesthesia Other Findings Past Medical History: No date: Allergic rhinitis No date: Anxiety No date: AVM (arteriovenous malformation) brain     Comment:  MRI'S SHOW CHRONIC HEMORRHAGE IN CEREBELLUM 2010: Clinically isolated syndrome of brainstem ([facility])     Comment:  Plaques on brainstem, followed by neurology No date: Elevated cholesterol No date: GERD (gastroesophageal reflux disease) No date: History of blood transfusion No date: Hypothyroidism No date: Low blood pressure No date: Pre-diabetes No date: Urinary incontinence     Comment:  After delivery of her recent baby  Past Surgical History: 07/28/2019: BICEPT TENODESIS     Comment:  Procedure: MINI OPEN BICEPS TENODESIS;  Surgeon: Corky Mull, MD;  Location: [facility] ORS;  Service: Orthopedics;; No date: BLADDER SURGERY     Comment:  as a child 08/12/2019: IR ANGIO INTRA EXTRACRAN SEL COM CAROTID INNOMINATE BILAT  MOD SED 08/12/2019: IR ANGIO VERTEBRAL SEL VERTEBRAL BILAT MOD SED 08/12/2019: IR ANGIOGRAM EXTREMITY LEFT 08/12/2019: IR US GUIDE VASC ACCESS  RIGHT No date: MOUTH SURGERY No date: NASAL SEPTUM SURGERY No date: REFRACTIVE SURGERY 07/28/2019: SHOULDER ARTHROSCOPY WITH SUBACROMIAL DECOMPRESSION,  ROTATOR CUFF REPAIR AND BICEP TENDON REPAIR; Left     Comment:  Procedure: SHOULDER ARTHROSCOPY WITH, DEBIRDEMENT,               SUBACROMIAL DECOMPRESSION, MINI OPEN ROTATOR CUFF REPAIR;              Surgeon: Corky Mull, MD;  Location: [facility] ORS;                Service: Orthopedics;  Laterality: Left; No date: TONSILLECTOMY  BMI    Body Mass Index: 35.51 kg/m      Reproductive/Obstetrics negative OB ROS                             Anesthesia Physical Anesthesia Plan  ASA: III  Anesthesia Plan: General   Post-op Pain Management:    Induction: Intravenous  PONV Risk Score and Plan: Propofol infusion and TIVA  Airway Management Planned: Natural Airway and Nasal Cannula  Additional Equipment:   Intra-op Plan:   Post-operative Plan:   Informed Consent: I have reviewed the patients History and Physical, chart, labs and discussed the procedure including the risks, benefits and alternatives for the proposed anesthesia with the patient or authorized representative who has indicated his/her understanding and acceptance.     Dental Advisory Given  Plan Discussed with: Anesthesiologist, CRNA and Surgeon  Anesthesia Plan Comments: (Patient  consented for risks of anesthesia including but not limited to:  - adverse reactions to medications - risk of intubation if required - damage to teeth, lips or other oral mucosa - sore throat or hoarseness - Damage to heart, brain, lungs or loss of life  Patient voiced understanding.)        Anesthesia Quick Evaluation

## 2020-02-01 NOTE — Transfer of Care (Signed)
Immediate Anesthesia Transfer of Care Note  Patient: Stephanie Shields  Procedure(s) Performed: COLONOSCOPY WITH PROPOFOL (N/A ) ESOPHAGOGASTRODUODENOSCOPY (EGD) WITH PROPOFOL (N/A )  Patient Location: Endoscopy Unit  Anesthesia Type:General  Level of Consciousness: drowsy, patient cooperative and responds to stimulation  Airway & Oxygen Therapy: Patient Spontanous Breathing and Patient connected to face mask oxygen  Post-op Assessment: Report given to RN and Post -op Vital signs reviewed and stable  Post vital signs: Reviewed and stable  Last Vitals:  Vitals Value Taken Time  BP 99/63 02/01/20 1027  Temp 36.5 C 02/01/20 1025  Pulse 65 02/01/20 1029  Resp 16 02/01/20 1029  SpO2 99 % 02/01/20 1029  Vitals shown include unvalidated device data.  Last Pain:  Vitals:   02/01/20 1025  TempSrc: Temporal         Complications: No apparent anesthesia complications

## 2020-02-01 NOTE — Op Note (Signed)
[facility] Gastroenterology Patient Name: Stephanie Shields Procedure Date: 02/01/2020 9:40 AM MRN: [id number] Account #: [id number] Date of Birth: 1971/08/02 Admit Type: Outpatient Age: 49 Room: [facility] ENDO ROOM 3 Gender: Female Note Status: Finalized Procedure:             Colonoscopy Indications:           Change in bowel habits Providers:             Lin Landsman MD, MD Referring MD:          Angela Adam. Caryl Bis (Referring MD) Medicines:             Monitored Anesthesia Care Complications:         No immediate complications. Estimated blood loss: None. Procedure:             Pre-Anesthesia Assessment:                        - Prior to the procedure, a History and Physical was                         performed, and patient medications and allergies were                         reviewed. The patient is competent. The risks and                         benefits of the procedure and the sedation options and                         risks were discussed with the patient. All questions                         were answered and informed consent was obtained.                         Patient identification and proposed procedure were                         verified by the physician, the nurse, the                         anesthesiologist, the anesthetist and the technician                         in the pre-procedure area in the procedure room in the                         endoscopy suite. Mental Status Examination: alert and                         oriented. Airway Examination: normal oropharyngeal                         airway and neck mobility. Respiratory Examination:                         clear to auscultation. CV Examination: normal.  Prophylactic Antibiotics: The patient does not require                         prophylactic antibiotics. Prior Anticoagulants: The                         patient has taken no previous anticoagulant or                   antiplatelet agents. ASA Grade Assessment: III - A                         patient with severe systemic disease. After reviewing                         the risks and benefits, the patient was deemed in                         satisfactory condition to undergo the procedure. The                         anesthesia plan was to use monitored anesthesia care                         (MAC). Immediately prior to administration of                         medications, the patient was re-assessed for adequacy                         to receive sedatives. The heart rate, respiratory                         rate, oxygen saturations, blood pressure, adequacy of                         pulmonary ventilation, and response to care were                         monitored throughout the procedure. The physical                         status of the patient was re-assessed after the                         procedure.                        After obtaining informed consent, the colonoscope was                         passed under direct vision. Throughout the procedure,                         the patient's blood pressure, pulse, and oxygen                         saturations were monitored continuously. The  Colonoscope was introduced through the anus and                         advanced to the the terminal ileum, with                         identification of the appendiceal orifice and IC                         valve. The colonoscopy was performed without                         difficulty. The patient tolerated the procedure well.                         The quality of the bowel preparation was evaluated                         using the BBPS [facility] Bowel Preparation Scale) with                         scores of: Right Colon = 3, Transverse Colon = 3 and                         Left Colon = 3 (entire mucosa seen well with no                         residual staining,  small fragments of stool or opaque                         liquid). The total BBPS score equals 9. Findings:      Five sessile polyps were found in the rectum and transverse colon. The       polyps were 3 to 5 mm in size. These polyps were removed with a cold       snare. Resection and retrieval were complete.      The terminal ileum appeared normal.      The retroflexed view of the distal rectum and anal verge was normal and       showed no anal or rectal abnormalities. Impression:            - Five 3 to 5 mm polyps in the rectum and in the                         transverse colon, removed with a cold snare. Resected                         and retrieved.                        - The examined portion of the ileum was normal.                        - The distal rectum and anal verge are normal on                         retroflexion view. Recommendation:        -  Discharge patient to home (with escort).                        - Resume previous diet today.                        - Continue present medications.                        - Await pathology results.                        - Repeat colonoscopy in 3 - 5 years for surveillance                         based on pathology results.                        - Return to my office as previously scheduled. Procedure Code(s):     --- Professional ---                        [phone], Colonoscopy, flexible; with removal of                         tumor(s), polyp(s), or other lesion(s) by snare                         technique Diagnosis Code(s):     --- Professional ---                        K62.1, Rectal polyp                        K63.5, Polyp of colon                        R19.4, Change in bowel habit CPT copyright 2019 American Medical Association. All rights reserved. The codes documented in this report are preliminary and upon coder review may  be revised to meet current compliance requirements. Dr. Ulyess Mort Lin Landsman  MD, MD 02/01/2020 10:25:13 AM This report has been signed electronically. Number of Addenda: 0 Note Initiated On: 02/01/2020 9:40 AM Scope Withdrawal Time: 0 hours 17 minutes 28 seconds  Total Procedure Duration: 0 hours 20 minutes 2 seconds  Estimated Blood Loss:  Estimated blood loss: none.      [facility]

## 2020-02-01 NOTE — Op Note (Signed)
[facility] Gastroenterology Patient Name: Stephanie Shields Procedure Date: 02/01/2020 9:40 AM MRN: [id number] Account #: [id number] Date of Birth: 1971-10-06 Admit Type: Outpatient Age: 49 Room: [facility] ENDO ROOM 3 Gender: Female Note Status: Finalized Procedure:             Upper GI endoscopy Indications:           Non-erosive esophageal reflux, Esophageal reflux                         symptoms that persist despite appropriate therapy Providers:             Lin Landsman MD, MD Referring MD:          Angela Adam. Caryl Bis (Referring MD) Medicines:             Monitored Anesthesia Care Complications:         No immediate complications. Estimated blood loss: None. Procedure:             Pre-Anesthesia Assessment:                        - Prior to the procedure, a History and Physical was                         performed, and patient medications and allergies were                         reviewed. The patient is competent. The risks and                         benefits of the procedure and the sedation options and                         risks were discussed with the patient. All questions                         were answered and informed consent was obtained.                         Patient identification and proposed procedure were                         verified by the physician, the nurse, the                         anesthesiologist, the anesthetist and the technician                         in the pre-procedure area in the procedure room in the                         endoscopy suite. Mental Status Examination: alert and                         oriented. Airway Examination: normal oropharyngeal                         airway and neck mobility. Respiratory Examination:  clear to auscultation. CV Examination: normal.                         Prophylactic Antibiotics: The patient does not require                         prophylactic  antibiotics. Prior Anticoagulants: The                         patient has taken no previous anticoagulant or                         antiplatelet agents. ASA Grade Assessment: III - A                         patient with severe systemic disease. After reviewing                         the risks and benefits, the patient was deemed in                         satisfactory condition to undergo the procedure. The                         anesthesia plan was to use monitored anesthesia care                         (MAC). Immediately prior to administration of                         medications, the patient was re-assessed for adequacy                         to receive sedatives. The heart rate, respiratory                         rate, oxygen saturations, blood pressure, adequacy of                         pulmonary ventilation, and response to care were                         monitored throughout the procedure. The physical                         status of the patient was re-assessed after the                         procedure.                        After obtaining informed consent, the endoscope was                         passed under direct vision. Throughout the procedure,                         the patient's blood pressure, pulse, and oxygen  saturations were monitored continuously. The Endoscope                         was introduced through the mouth, and advanced to the                         second part of duodenum. The upper GI endoscopy was                         accomplished without difficulty. The patient tolerated                         the procedure well. Findings:      The duodenal bulb and second portion of the duodenum were normal.       Biopsies for histology were taken with a cold forceps for evaluation of       celiac disease.      Multiple 3 to 10 mm pedunculated and sessile fundic gland polyps with no       bleeding and stigmata of recent  bleeding were found in the gastric       fundus and in the gastric body. Biopsies were taken with a cold forceps       for histology.      The gastric body, incisura and gastric antrum were normal. Biopsies were       taken with a cold forceps for Helicobacter pylori testing.      Esophagogastric landmarks were identified: the gastroesophageal junction       was found at 36 cm from the incisors.      The gastroesophageal junction and examined esophagus were normal.       Biopsies were taken with a cold forceps for histology. Impression:            - Normal duodenal bulb and second portion of the                         duodenum. Biopsied.                        - Multiple fundic gland polyps. Biopsied.                        - Normal gastric body, incisura and antrum. Biopsied.                        - Esophagogastric landmarks identified.                        - Normal gastroesophageal junction and esophagus.                         Biopsied. Recommendation:        - Await pathology results.                        - Follow an antireflux regimen.                        - Continue present medications.                        - Return  to my office as previously scheduled. Procedure Code(s):     --- Professional ---                        [phone], Esophagogastroduodenoscopy, flexible,                         transoral; with biopsy, single or multiple Diagnosis Code(s):     --- Professional ---                        K31.7, Polyp of stomach and duodenum                        K21.9, Gastro-esophageal reflux disease without                         esophagitis CPT copyright 2019 American Medical Association. All rights reserved. The codes documented in this report are preliminary and upon coder review may  be revised to meet current compliance requirements. Dr. Ulyess Mort Lin Landsman MD, MD 02/01/2020 10:00:55 AM This report has been signed electronically. Number of Addenda: 0 Note  Initiated On: 02/01/2020 9:40 AM Estimated Blood Loss:  Estimated blood loss: none.      [facility]

## 2020-02-01 NOTE — Anesthesia Postprocedure Evaluation (Signed)
Anesthesia Post Note  Patient: Stephanie Shields  Procedure(s) Performed: COLONOSCOPY WITH PROPOFOL (N/A ) ESOPHAGOGASTRODUODENOSCOPY (EGD) WITH PROPOFOL (N/A )  Patient location during evaluation: Endoscopy Anesthesia Type: General Level of consciousness: awake and alert Pain management: pain level controlled Vital Signs Assessment: post-procedure vital signs reviewed and stable Respiratory status: spontaneous breathing, nonlabored ventilation, respiratory function stable and patient connected to nasal cannula oxygen Cardiovascular status: blood pressure returned to baseline and stable Postop Assessment: no apparent nausea or vomiting Anesthetic complications: no     Last Vitals:  Vitals:   02/01/20 0842 02/01/20 1025  BP: 109/78 99/63  Pulse: 60   Resp: 17   Temp: 36.7 C 36.5 C  SpO2: 98%     Last Pain:  Vitals:   02/01/20 1025  TempSrc: Temporal                 Precious Haws [place]

## 2020-02-01 NOTE — H&P (Signed)
Cephas Darby, MD [address]  [place]  [place], [place] [address]  Main: [phone]  Fax: [phone] Pager: [phone]  Primary Care Physician:  Leone Haven, MD Primary Gastroenterologist:  Dr. Cephas Darby  Pre-Procedure History & Physical: HPI:  Stephanie Shields is a 49 y.o. female is here for an endoscopy and colonoscopy.   Past Medical History:  Diagnosis Date  . Allergic rhinitis   . Anxiety   . AVM (arteriovenous malformation) brain    MRI'S SHOW CHRONIC HEMORRHAGE IN CEREBELLUM  . 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 Elevated cholesterol   . GERD (gastroesophageal reflux disease)   . History of blood transfusion   . Hypothyroidism   . Low blood pressure   . Pre-diabetes   . Urinary incontinence    After delivery of her recent baby    Past Surgical History:  Procedure Laterality Date  . BICEPT TENODESIS  07/28/2019   Procedure: MINI OPEN BICEPS TENODESIS;  Surgeon: Corky Mull, MD;  Location: [facility] ORS;  Service: Orthopedics;;  . BLADDER SURGERY     as a child  . IR ANGIO INTRA EXTRACRAN SEL COM CAROTID INNOMINATE BILAT MOD SED  08/12/2019  . IR ANGIO VERTEBRAL SEL VERTEBRAL BILAT MOD SED  08/12/2019  . IR ANGIOGRAM EXTREMITY LEFT  08/12/2019  . IR US GUIDE VASC ACCESS RIGHT  08/12/2019  . MOUTH SURGERY    . NASAL SEPTUM SURGERY    . REFRACTIVE SURGERY    . SHOULDER ARTHROSCOPY WITH SUBACROMIAL DECOMPRESSION, ROTATOR CUFF REPAIR AND BICEP TENDON REPAIR Left 07/28/2019   Procedure: SHOULDER ARTHROSCOPY WITH, DEBIRDEMENT, SUBACROMIAL DECOMPRESSION, MINI OPEN ROTATOR CUFF REPAIR;  Surgeon: Corky Mull, MD;  Location: [facility] ORS;  Service: Orthopedics;  Laterality: Left;  . TONSILLECTOMY      Prior to Admission medications   Medication Sig Start Date End Date Taking? Authorizing Provider  DEXILANT 60 MG capsule TAKE 1 CAPSULE BY MOUTH EVERY DAY 01/17/20  Yes Leone Haven, MD   levothyroxine (SYNTHROID) 150 MCG tablet TAKE 1 TABLET (150 MCG TOTAL) BY MOUTH DAILY BEFORE BREAKFAST. 08/12/19 08/11/20 Yes Leone Haven, MD  Cholecalciferol (VITAMIN D) 50 MCG (2000 UT) tablet Take 2,000 Units by mouth daily at 3 pm.    [provider]  levonorgestrel (MIRENA) 20 MCG/24HR IUD 1 each by Intrauterine route once.    [provider]    Allergies as of 01/11/2020 - Review Complete 01/11/2020  Allergen Reaction Noted  . Sulfa antibiotics Nausea And Vomiting 04/04/2013  . Lobster [shellfish allergy] Nausea And Vomiting 07/21/2019  . Other Nausea And Vomiting 07/21/2019    Family History  Problem Relation Age of Onset  . Arthritis Other        Parent, Grandparent  . Prostate cancer Other        Grandparent  . Hyperlipidemia Other        Parent  . Heart disease Other        Grandparent  . Stroke Other        Grandparent  . Diabetes Other        Parent    Social History   Socioeconomic History  . Marital status: Married    Spouse name: Not on file  . Number of children: Not on file  . Years of education: Not on file  . Highest education level: Not on file  Occupational History  . Not on  file  Tobacco Use  . Smoking status: Never Smoker  . Smokeless tobacco: Never Used  Substance and Sexual Activity  . Alcohol use: Yes    Alcohol/week: 0.0 standard drinks    Comment: rare  . Drug use: No  . Sexual activity: Not on file  Other Topics Concern  . Not on file  Social History Narrative  . Not on file   Social Determinants of Health   Financial Resource Strain:   . Difficulty of Paying Living Expenses: Not on file  Food Insecurity:   . Worried About Charity fundraiser in the Last Year: Not on file  . Ran Out of Food in the Last Year: Not on file  Transportation Needs:   . Lack of Transportation (Medical): Not on file  . Lack of Transportation (Non-Medical): Not on file  Physical Activity:   . Days of Exercise per Week: Not on  file  . Minutes of Exercise per Session: Not on file  Stress:   . Feeling of Stress : Not on file  Social Connections:   . Frequency of Communication with Friends and Family: Not on file  . Frequency of Social Gatherings with Friends and Family: Not on file  . Attends Religious Services: Not on file  . Active Member of Clubs or Organizations: Not on file  . Attends Archivist Meetings: Not on file  . Marital Status: Not on file  Intimate Partner Violence:   . Fear of Current or Ex-Partner: Not on file  . Emotionally Abused: Not on file  . Physically Abused: Not on file  . Sexually Abused: Not on file    Review of Systems: See HPI, otherwise negative ROS  Physical Exam: BP 109/78   Pulse 60   Temp 98.1 F (36.7 C) (Skin)   Resp 17   Ht 5\' 6"  (1.676 m)   Wt 99.8 kg   SpO2 98%   BMI 35.51 kg/m  General:   Alert,  pleasant and cooperative in NAD Head:  Normocephalic and atraumatic. Neck:  Supple; no masses or thyromegaly. Lungs:  Clear throughout to auscultation.    Heart:  Regular rate and rhythm. Abdomen:  Soft, nontender and nondistended. Normal bowel sounds, without guarding, and without rebound.   Neurologic:  Alert and  oriented x4;  grossly normal neurologically.  Impression/Plan: Stephanie Shields is here for an endoscopy and colonoscopy to be performed for chronic GERD and change in bowel habits  Risks, benefits, limitations, and alternatives regarding  endoscopy and colonoscopy have been reviewed with the patient.  Questions have been answered.  All parties agreeable.   Sherri Sear, MD  02/01/2020, 9:36 AM

## 2020-02-02 ENCOUNTER — Encounter: Payer: Self-pay | Admitting: Gastroenterology

## 2020-02-02 ENCOUNTER — Encounter: Payer: Self-pay | Admitting: *Deleted

## 2020-02-02 LAB — SURGICAL PATHOLOGY

## 2020-02-15 ENCOUNTER — Other Ambulatory Visit: Payer: Self-pay

## 2020-02-15 ENCOUNTER — Telehealth (INDEPENDENT_AMBULATORY_CARE_PROVIDER_SITE_OTHER): Payer: BC Managed Care – PPO | Admitting: Family Medicine

## 2020-02-15 ENCOUNTER — Ambulatory Visit: Payer: BC Managed Care – PPO

## 2020-02-15 ENCOUNTER — Encounter: Payer: Self-pay | Admitting: Family Medicine

## 2020-02-15 ENCOUNTER — Ambulatory Visit (INDEPENDENT_AMBULATORY_CARE_PROVIDER_SITE_OTHER): Payer: BC Managed Care – PPO | Admitting: Psychology

## 2020-02-15 DIAGNOSIS — J069 Acute upper respiratory infection, unspecified: Secondary | ICD-10-CM

## 2020-02-15 DIAGNOSIS — E559 Vitamin D deficiency, unspecified: Secondary | ICD-10-CM

## 2020-02-15 DIAGNOSIS — R7303 Prediabetes: Secondary | ICD-10-CM | POA: Diagnosis not present

## 2020-02-15 DIAGNOSIS — E039 Hypothyroidism, unspecified: Secondary | ICD-10-CM

## 2020-02-15 DIAGNOSIS — F4310 Post-traumatic stress disorder, unspecified: Secondary | ICD-10-CM | POA: Diagnosis not present

## 2020-02-15 DIAGNOSIS — Z20822 Contact with and (suspected) exposure to covid-19: Secondary | ICD-10-CM

## 2020-02-15 DIAGNOSIS — K219 Gastro-esophageal reflux disease without esophagitis: Secondary | ICD-10-CM

## 2020-02-15 DIAGNOSIS — F4323 Adjustment disorder with mixed anxiety and depressed mood: Secondary | ICD-10-CM | POA: Diagnosis not present

## 2020-02-15 LAB — POC COVID19 BINAXNOW: SARS Coronavirus 2 Ag: NEGATIVE

## 2020-02-15 NOTE — Progress Notes (Signed)
Virtual Visit via video Note  This visit type was conducted due to national recommendations for restrictions regarding the COVID-19 pandemic (e.g. social distancing).  This format is felt to be most appropriate for this patient at this time.  All issues noted in this document were discussed and addressed.  No physical exam was performed (except for noted visual exam findings with Video Visits).   I connected with Stephanie Shields today at  9:00 AM EDT by a video enabled telemedicine application and verified that I am speaking with the correct person using two identifiers. Location patient: home Location provider: home office Persons participating in the virtual visit: patient, provider  I discussed the limitations, risks, security and privacy concerns of performing an evaluation and management service by telephone and the availability of in person appointments. I also discussed with the patient that there may be a patient responsible charge related to this service. The patient expressed understanding and agreed to proceed.   Reason for visit: follow-up  HPI: HYPOTHYROIDISM Disease Monitoring Weight changes: no  Skin Changes: no Heat/Cold intolerance: no  Medication Monitoring Compliance:  Taking synthroid   Last TSH:   Lab Results  Component Value Date   TSH 0.66 12/22/2018   GERD:   Reflux symptoms: well controlled   Abd pain: no   Blood in stool: no  Dysphagia: no   EGD: recently with GI, benign pathology Medication: dexilant  Cold symptoms: Patient notes over the weekend starting to feel a little sick with low energy.  Then progressively developed a raw throat with postnasal drip and fatigue.  She notes a sore left-sided cervical lymph node.  No cough.  Does have nasal congestion.  No fevers, taste disturbance, smell disturbance, or Covid exposure.  Some chills.  She does note her son was ill with similar symptoms over the weekend.  OSA: Continues on CPAP.  She had issues with  some of her equipment and the filter starts to develop black spots on it.  She does not know if this is safe to use.  She has been trying to deal with the equipment company to get these more frequently though they have declined that request.  She has not checked with them to see if this would be a safe thing to use.  ROS: See pertinent positives and negatives per HPI.  Past Medical History:  Diagnosis Date  . Allergic rhinitis   . Anxiety   . AVM (arteriovenous malformation) brain    MRI'S SHOW CHRONIC HEMORRHAGE IN CEREBELLUM  . 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 Elevated cholesterol   . GERD (gastroesophageal reflux disease)   . History of blood transfusion   . Hypothyroidism   . Low blood pressure   . Pre-diabetes   . Urinary incontinence    After delivery of her recent baby    Past Surgical History:  Procedure Laterality Date  . BICEPT TENODESIS  07/28/2019   Procedure: MINI OPEN BICEPS TENODESIS;  Surgeon: Corky Mull, MD;  Location: [facility];  Service: Orthopedics;;  . BLADDER SURGERY     as a child  . COLONOSCOPY WITH PROPOFOL N/A 02/01/2020   Procedure: COLONOSCOPY WITH PROPOFOL;  Surgeon: Lin Landsman, MD;  Location: [facility] ENDOSCOPY;  Service: Gastroenterology;  Laterality: N/A;  . ESOPHAGOGASTRODUODENOSCOPY (EGD) WITH PROPOFOL N/A 02/01/2020   Procedure: ESOPHAGOGASTRODUODENOSCOPY (EGD) WITH PROPOFOL;  Surgeon: Lin Landsman, MD;  Location: [facility] ENDOSCOPY;  Service: Gastroenterology;  Laterality: N/A;  . IR ANGIO INTRA EXTRACRAN SEL COM CAROTID INNOMINATE BILAT MOD SED  08/12/2019  . IR ANGIO VERTEBRAL SEL VERTEBRAL BILAT MOD SED  08/12/2019  . IR ANGIOGRAM EXTREMITY LEFT  08/12/2019  . IR US GUIDE VASC ACCESS RIGHT  08/12/2019  . MOUTH SURGERY    . NASAL SEPTUM SURGERY    . REFRACTIVE SURGERY    . SHOULDER ARTHROSCOPY WITH SUBACROMIAL DECOMPRESSION, ROTATOR CUFF REPAIR AND BICEP TENDON REPAIR  Left 07/28/2019   Procedure: SHOULDER ARTHROSCOPY WITH, DEBIRDEMENT, SUBACROMIAL DECOMPRESSION, MINI OPEN ROTATOR CUFF REPAIR;  Surgeon: Corky Mull, MD;  Location: [facility] ORS;  Service: Orthopedics;  Laterality: Left;  . TONSILLECTOMY      Family History  Problem Relation Age of Onset  . Arthritis Other        Parent, Grandparent  . Prostate cancer Other        Grandparent  . Hyperlipidemia Other        Parent  . Heart disease Other        Grandparent  . Stroke Other        Grandparent  . Diabetes Other        Parent    SOCIAL HX: Non-smoker   Current Outpatient Medications:  .  Cholecalciferol (VITAMIN D) 50 MCG (2000 UT) tablet, Take 2,000 Units by mouth daily at 3 pm., Disp: , Rfl:  .  DEXILANT 60 MG capsule, TAKE 1 CAPSULE BY MOUTH EVERY DAY, Disp: 90 capsule, Rfl: 1 .  levonorgestrel (MIRENA) 20 MCG/24HR IUD, 1 each by Intrauterine route once., Disp: , Rfl:  .  levothyroxine (SYNTHROID) 150 MCG tablet, TAKE 1 TABLET (150 MCG TOTAL) BY MOUTH DAILY BEFORE BREAKFAST., Disp: 90 tablet, Rfl: 3  EXAM:  VITALS per patient if applicable:  GENERAL: alert, oriented, appears well and in no acute distress  HEENT: atraumatic, conjunttiva clear, no obvious abnormalities on inspection of external nose and ears  NECK: normal movements of the head and neck  LUNGS: on inspection no signs of respiratory distress, breathing rate appears normal, no obvious gross SOB, gasping or wheezing  CV: no obvious cyanosis  MS: moves all visible extremities without noticeable abnormality  PSYCH/NEURO: pleasant and cooperative, no obvious depression or anxiety, speech and thought processing grossly intact  ASSESSMENT AND PLAN:  Discussed the following assessment and plan:  Viral upper respiratory illness Symptoms are likely related to a viral upper respiratory illness given that the patient is fully vaccinated against COVID-19.  Discussed that there is a small chance that she could have  COVID-19 and thus we should get her tested.  She will schedule testing today.  Advised on quarantine guidelines that will last at least until we get the test results back.  GERD (gastroesophageal reflux disease) Stable.  Continue Dexilant.  Hypothyroidism Continue Synthroid.  Check labs.  Prediabetes Check A1c.  Check lipid panel.  Vitamin D deficiency Check vitamin D.   Orders Placed This Encounter  Procedures  . Comp Met (CMET)    Standing Status:   Future    Standing Expiration Date:   02/14/2021  . TSH    Standing Status:   Future    Standing Expiration Date:   02/14/2021  . HgB A1c    Standing Status:   Future    Standing Expiration Date:   02/14/2021  . Lipid panel    Standing Status:   Future    Standing Expiration Date:   02/14/2021  . Vitamin D (25 hydroxy)  Standing Status:   Future    Standing Expiration Date:   02/14/2021    No orders of the defined types were placed in this encounter.    I discussed the assessment and treatment plan with the patient. The patient was provided an opportunity to ask questions and all were answered. The patient agreed with the plan and demonstrated an understanding of the instructions.   The patient was advised to call back or seek an in-person evaluation if the symptoms worsen or if the condition fails to improve as anticipated.   [name], MD

## 2020-02-15 NOTE — Assessment & Plan Note (Signed)
Symptoms are likely related to a viral upper respiratory illness given that the patient is fully vaccinated against COVID-19.  Discussed that there is a small chance that she could have COVID-19 and thus we should get her tested.  She will schedule testing today.  Advised on quarantine guidelines that will last at least until we get the test results back.

## 2020-02-15 NOTE — Assessment & Plan Note (Signed)
Continue Synthroid.  Check labs.

## 2020-02-15 NOTE — Assessment & Plan Note (Signed)
Check vitamin D. 

## 2020-02-15 NOTE — Assessment & Plan Note (Signed)
Stable.  Continue Dexilant.

## 2020-02-15 NOTE — Assessment & Plan Note (Signed)
Check A1c.  Check lipid panel.

## 2020-02-16 DIAGNOSIS — G4733 Obstructive sleep apnea (adult) (pediatric): Secondary | ICD-10-CM | POA: Diagnosis not present

## 2020-02-16 LAB — NOVEL CORONAVIRUS, NAA: SARS-CoV-2, NAA: NOT DETECTED

## 2020-02-16 NOTE — Telephone Encounter (Signed)
I called and explained to the patient that due to the insurance she cannot get more masal pillow unless she pays out of pocket.  Jackob Crookston,cma

## 2020-02-21 ENCOUNTER — Encounter: Payer: Self-pay | Admitting: Family Medicine

## 2020-02-21 MED ORDER — AMOXICILLIN-POT CLAVULANATE 875-125 MG PO TABS: 1.0000 | ORAL_TABLET | Freq: Two times a day (BID) | ORAL | 0 refills | Status: DC

## 2020-02-27 ENCOUNTER — Telehealth (HOSPITAL_COMMUNITY): Payer: Self-pay

## 2020-02-27 NOTE — Telephone Encounter (Signed)
Called to schedule f/u mri. Pt says that she is already seeing a doc at [facility] who is following her. AW

## 2020-02-29 ENCOUNTER — Ambulatory Visit (INDEPENDENT_AMBULATORY_CARE_PROVIDER_SITE_OTHER): Payer: BC Managed Care – PPO | Admitting: Psychology

## 2020-02-29 DIAGNOSIS — F4323 Adjustment disorder with mixed anxiety and depressed mood: Secondary | ICD-10-CM | POA: Diagnosis not present

## 2020-02-29 DIAGNOSIS — F4310 Post-traumatic stress disorder, unspecified: Secondary | ICD-10-CM

## 2020-03-01 DIAGNOSIS — G4733 Obstructive sleep apnea (adult) (pediatric): Secondary | ICD-10-CM | POA: Diagnosis not present

## 2020-03-05 DIAGNOSIS — G4733 Obstructive sleep apnea (adult) (pediatric): Secondary | ICD-10-CM | POA: Diagnosis not present

## 2020-03-14 ENCOUNTER — Ambulatory Visit: Payer: BC Managed Care – PPO | Admitting: Psychology

## 2020-03-26 ENCOUNTER — Other Ambulatory Visit: Payer: Self-pay

## 2020-03-26 ENCOUNTER — Ambulatory Visit: Payer: BC Managed Care – PPO | Admitting: Gastroenterology

## 2020-03-26 ENCOUNTER — Encounter: Payer: Self-pay | Admitting: Gastroenterology

## 2020-03-26 VITALS — BP 111/61 | HR 66 | Temp 98.2°F | Ht 66.0 in | Wt 226.2 lb

## 2020-03-26 DIAGNOSIS — K219 Gastro-esophageal reflux disease without esophagitis: Secondary | ICD-10-CM | POA: Diagnosis not present

## 2020-03-26 MED ORDER — DEXILANT 30 MG PO CPDR: 30.0000 mg | DELAYED_RELEASE_CAPSULE | Freq: Every day | ORAL | 1 refills | Status: DC

## 2020-03-26 NOTE — Progress Notes (Signed)
Stephanie Darby, MD [address]  [place]  [place], [place] [address]  Main: [phone]  Fax: [phone]    Gastroenterology Consultation  Referring Provider:     Leone Haven, MD Primary Care Physician:  Leone Haven, MD Primary Gastroenterologist:  Dr. Cephas Shields Reason for Consultation:     GERD        HPI:   Stephanie Shields is a 49 y.o. female referred by Dr. Caryl Bis, Angela Adam, MD  for consultation & management of chronic GERD, symptoms include heart burn, burping, reflux. Has been taking dexilant 60mg  for several years which provides significant relief.  Currently, on prilosec 20mg  OTC daily while waiting on authorization. She prefers to come off PPI if possible. Denies dysphagia. Trying to lose weight, lost about ~15lbs in last 18months.  She is trying to avoid food triggers such as coffee, chocolate and spicy foods She does not smoke or drink alcohol  Follow-up virtual visit 01/11/2020 Stephanie Shields was originally seen by me in 01/2019 for chronic GERD and history of esophagitis.  She was on Prilosec at that time and later transitioned to Dexilant 60 mg daily which she was taking it for Prilosec.  Due to pandemic, EGD was postponed.  She wanted to connect and readdress about the EGD.  She also noticed that for the last 1 year, she has been experiencing soft mushy bowel movements, sensation of incomplete emptying, feeling full, bloating.  She denies rectal bleeding or abdominal pain.  She may have up to 2 bowel movements a day.  She used to eat out before pandemic, has been eating mostly homemade food.  She denies carbonated beverages, use of artificial sweeteners.  She may have gained few pounds staying at home.  She does not smoke or drink alcohol She could not identify any particular dietary triggers She certainly thinks last year has been more stressful compared to previous years  Follow-up visit 03/26/2020 Stephanie Shields reports that her GERD symptoms are fairly  under control.  She is taking probiotics daily which are helping with her GI symptoms.  She has been on Dexilant 60 mg daily for more than 10 years.  She recently underwent upper endoscopy which revealed gastric fundic gland polyps.  NSAIDs: None  Antiplts/Anticoagulants/Anti thrombotics: None  GI Procedures: EGD in 05/2014 revealed esophagitis No esophageal biopsies were performed.  There was no evidence of H. pylori and gastric biopsies.  EGD and colonoscopy 02/01/2020  - Normal duodenal bulb and second portion of the duodenum. Biopsied. - Multiple fundic gland polyps. Biopsied. - Normal gastric body, incisura and antrum. Biopsied. - Esophagogastric landmarks identified. - Normal gastroesophageal junction and esophagus. Biopsied.  - Five 3 to 5 mm polyps in the rectum and in the transverse colon, removed with a cold snare. Resected and retrieved. - The examined portion of the ileum was normal. - The distal rectum and anal verge are normal on retroflexion view.  DIAGNOSIS:  A. DUODENUM; COLD BIOPSY:  - ENTERIC MUCOSA WITH PRESERVED VILLOUS ARCHITECTURE AND NO SIGNIFICANT  HISTOPATHOLOGIC CHANGE.  - NEGATIVE FOR FEATURES OF CELIAC, DYSPLASIA, AND MALIGNANCY.   B. STOMACH, RANDOM; COLD BIOPSY:  - GASTRIC ANTRAL AND OXYNTIC MUCOSA WITH NO SIGNIFICANT HISTOPATHOLOGIC  CHANGE.  - NEGATIVE FOR H. PYLORI, DYSPLASIA, AND MALIGNANCY.   C. GASTRIC POLYPS; COLD BIOPSY:  - FRAGMENTS (X3) OF FUNDIC GLAND POLYPS.  - NEGATIVE FOR ATYPIA AND MALIGNANCY.   D. ESOPHAGUS; COLD BIOPSY:  - SQUAMOUS MUCOSA WITH NO SIGNIFICANT HISTOPATHOLOGIC  CHANGE.  - NEGATIVE FOR DYSPLASIA AND MALIGNANCY.   E. COLON POLYPS X4, TRANSVERSE; COLD SNARE:  - FRAGMENTS (X4) OF TUBULAR ADENOMAS.  - MULTIPLE FRAGMENTS OF BENIGN COLONIC MUCOSA WITH NO SIGNIFICANT  HISTOPATHOLOGIC CHANGE.  - NEGATIVE FOR HIGH-GRADE DYSPLASIA AND MALIGNANCY.   F. RECTAL POLYP; COLD SNARE:  - TUBULAR ADENOMA.  - NEGATIVE FOR  HIGH-GRADE DYSPLASIA AND MALIGNANCY.   She denies family history of GI malignancy  Past Medical History:  Diagnosis Date  . Allergic rhinitis   . Anxiety   . AVM (arteriovenous malformation) brain    MRI'S SHOW CHRONIC HEMORRHAGE IN CEREBELLUM  . 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 Elevated cholesterol   . GERD (gastroesophageal reflux disease)   . History of blood transfusion   . Hypothyroidism   . Low blood pressure   . Pre-diabetes   . Urinary incontinence    After delivery of her recent baby    Past Surgical History:  Procedure Laterality Date  . BICEPT TENODESIS  07/28/2019   Procedure: MINI OPEN BICEPS TENODESIS;  Surgeon: Corky Mull, MD;  Location: [facility] ORS;  Service: Orthopedics;;  . BLADDER SURGERY     as a child  . COLONOSCOPY WITH PROPOFOL N/A 02/01/2020   Procedure: COLONOSCOPY WITH PROPOFOL;  Surgeon: Lin Landsman, MD;  Location: [facility] ENDOSCOPY;  Service: Gastroenterology;  Laterality: N/A;  . ESOPHAGOGASTRODUODENOSCOPY (EGD) WITH PROPOFOL N/A 02/01/2020   Procedure: ESOPHAGOGASTRODUODENOSCOPY (EGD) WITH PROPOFOL;  Surgeon: Lin Landsman, MD;  Location: [facility] ENDOSCOPY;  Service: Gastroenterology;  Laterality: N/A;  . IR ANGIO INTRA EXTRACRAN SEL COM CAROTID INNOMINATE BILAT MOD SED  08/12/2019  . IR ANGIO VERTEBRAL SEL VERTEBRAL BILAT MOD SED  08/12/2019  . IR ANGIOGRAM EXTREMITY LEFT  08/12/2019  . IR US GUIDE VASC ACCESS RIGHT  08/12/2019  . MOUTH SURGERY    . NASAL SEPTUM SURGERY    . REFRACTIVE SURGERY    . SHOULDER ARTHROSCOPY WITH SUBACROMIAL DECOMPRESSION, ROTATOR CUFF REPAIR AND BICEP TENDON REPAIR Left 07/28/2019   Procedure: SHOULDER ARTHROSCOPY WITH, DEBIRDEMENT, SUBACROMIAL DECOMPRESSION, MINI OPEN ROTATOR CUFF REPAIR;  Surgeon: Corky Mull, MD;  Location: [facility] ORS;  Service: Orthopedics;  Laterality: Left;  . TONSILLECTOMY      Current Outpatient Medications:  .   Cholecalciferol (VITAMIN D) 50 MCG (2000 UT) tablet, Take 2,000 Units by mouth daily at 3 pm., Disp: , Rfl:  .  DEXILANT 60 MG capsule, TAKE 1 CAPSULE BY MOUTH EVERY DAY, Disp: 90 capsule, Rfl: 1 .  levonorgestrel (MIRENA) 20 MCG/24HR IUD, 1 each by Intrauterine route once., Disp: , Rfl:  .  levothyroxine (SYNTHROID) 150 MCG tablet, TAKE 1 TABLET (150 MCG TOTAL) BY MOUTH DAILY BEFORE BREAKFAST., Disp: 90 tablet, Rfl: 3 .  Probiotic CAPS, , Disp: , Rfl:    Family History  Problem Relation Age of Onset  . Arthritis Other        Parent, Grandparent  . Prostate cancer Other        Grandparent  . Hyperlipidemia Other        Parent  . Heart disease Other        Grandparent  . Stroke Other        Grandparent  . Diabetes Other        Parent     Social History   Tobacco Use  . Smoking status: Never Smoker  . Smokeless tobacco: Never Used  Substance Use Topics  .  Alcohol use: Yes    Alcohol/week: 0.0 standard drinks    Comment: rare  . Drug use: No    Allergies as of 03/26/2020 - Review Complete 03/26/2020  Allergen Reaction Noted  . Sulfa antibiotics Nausea And Vomiting 04/04/2013  . Lobster [shellfish allergy] Nausea And Vomiting 07/21/2019  . Other Nausea And Vomiting 07/21/2019    Review of Systems:    All systems reviewed and negative except where noted in HPI.   Physical Exam:  BP 111/61 (BP Location: Left Arm, Stephanie Shields Position: Sitting, Cuff Size: Normal)   Pulse 66   Temp 98.2 F (36.8 C) (Oral)   Ht 5\' 6"  (1.676 m)   Wt 226 lb 3.2 oz (102.6 kg)   BMI 36.51 kg/m  No LMP recorded. (Menstrual status: IUD).  General:   Alert,  Well-developed, well-nourished, pleasant and cooperative in NAD Head:  Normocephalic and atraumatic. Eyes:  Sclera clear, no icterus.   Conjunctiva pink. Ears:  Normal auditory acuity. Nose:  No deformity, discharge, or lesions. Mouth:  No deformity or lesions,oropharynx pink & moist. Neck:  Supple; no masses or thyromegaly. Lungs:   Respirations even and unlabored.  Clear throughout to auscultation.   No wheezes, crackles, or rhonchi. No acute distress. Heart:  Regular rate and rhythm; no murmurs, clicks, rubs, or gallops. Abdomen:  Normal bowel sounds. Soft, non-tender and non-distended without masses, hepatosplenomegaly or hernias noted.  No guarding or rebound tenderness.   Rectal: Not performed Msk:  Symmetrical without gross deformities. Good, equal movement & strength bilaterally. Pulses:  Normal pulses noted. Extremities:  No clubbing or edema.  No cyanosis. Neurologic:  Alert and oriented x3;  grossly normal neurologically. Skin:  Intact without significant lesions or rashes. No jaundice. Psych:  Alert and cooperative. Normal mood and affect.  Imaging Studies: No abdominal imaging  Assessment and Plan:   PREET HARPHAM is a 49 y.o. 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

## 2020-03-28 ENCOUNTER — Ambulatory Visit (INDEPENDENT_AMBULATORY_CARE_PROVIDER_SITE_OTHER): Payer: BC Managed Care – PPO | Admitting: Psychology

## 2020-03-28 DIAGNOSIS — F4323 Adjustment disorder with mixed anxiety and depressed mood: Secondary | ICD-10-CM

## 2020-03-28 DIAGNOSIS — F4310 Post-traumatic stress disorder, unspecified: Secondary | ICD-10-CM

## 2020-04-11 ENCOUNTER — Ambulatory Visit (INDEPENDENT_AMBULATORY_CARE_PROVIDER_SITE_OTHER): Payer: BC Managed Care – PPO | Admitting: Psychology

## 2020-04-11 DIAGNOSIS — F4310 Post-traumatic stress disorder, unspecified: Secondary | ICD-10-CM | POA: Diagnosis not present

## 2020-04-11 DIAGNOSIS — F330 Major depressive disorder, recurrent, mild: Secondary | ICD-10-CM

## 2020-04-23 DIAGNOSIS — G379 Demyelinating disease of central nervous system, unspecified: Secondary | ICD-10-CM | POA: Diagnosis not present

## 2020-04-25 ENCOUNTER — Other Ambulatory Visit: Payer: Self-pay | Admitting: Gastroenterology

## 2020-04-25 ENCOUNTER — Ambulatory Visit: Payer: BC Managed Care – PPO | Admitting: Psychology

## 2020-04-25 DIAGNOSIS — K219 Gastro-esophageal reflux disease without esophagitis: Secondary | ICD-10-CM

## 2020-05-06 ENCOUNTER — Other Ambulatory Visit: Payer: Self-pay | Admitting: Gastroenterology

## 2020-05-06 DIAGNOSIS — K219 Gastro-esophageal reflux disease without esophagitis: Secondary | ICD-10-CM

## 2020-05-16 ENCOUNTER — Encounter: Payer: Self-pay | Admitting: Gastroenterology

## 2020-05-23 ENCOUNTER — Encounter: Payer: Self-pay | Admitting: Family Medicine

## 2020-05-23 ENCOUNTER — Other Ambulatory Visit: Payer: Self-pay

## 2020-05-23 ENCOUNTER — Ambulatory Visit: Payer: BC Managed Care – PPO | Admitting: Family Medicine

## 2020-05-23 DIAGNOSIS — J309 Allergic rhinitis, unspecified: Secondary | ICD-10-CM

## 2020-05-23 DIAGNOSIS — G378 Other specified demyelinating diseases of central nervous system: Secondary | ICD-10-CM

## 2020-05-23 DIAGNOSIS — K219 Gastro-esophageal reflux disease without esophagitis: Secondary | ICD-10-CM

## 2020-05-23 DIAGNOSIS — E6609 Other obesity due to excess calories: Secondary | ICD-10-CM | POA: Diagnosis not present

## 2020-05-23 DIAGNOSIS — Z6836 Body mass index (BMI) 36.0-36.9, adult: Secondary | ICD-10-CM | POA: Diagnosis not present

## 2020-05-23 DIAGNOSIS — F32A Depression, unspecified: Secondary | ICD-10-CM

## 2020-05-23 DIAGNOSIS — F419 Anxiety disorder, unspecified: Secondary | ICD-10-CM

## 2020-05-23 DIAGNOSIS — E039 Hypothyroidism, unspecified: Secondary | ICD-10-CM | POA: Diagnosis not present

## 2020-05-23 DIAGNOSIS — F329 Major depressive disorder, single episode, unspecified: Secondary | ICD-10-CM

## 2020-05-23 MED ORDER — AZELASTINE HCL 0.1 % NA SOLN: 2.0000 | Freq: Two times a day (BID) | NASAL | 1 refills | Status: DC

## 2020-05-23 NOTE — Assessment & Plan Note (Signed)
Doing well.  She will continue to monitor.

## 2020-05-23 NOTE — Assessment & Plan Note (Signed)
Doing well on Prilosec.  She will continue this through GI.

## 2020-05-23 NOTE — Assessment & Plan Note (Signed)
She will continue to see neurosurgery for this at [facility].

## 2020-05-23 NOTE — Assessment & Plan Note (Signed)
Astelin sent to pharmacy to use on an as-needed basis.  She will monitor her symptoms.

## 2020-05-23 NOTE — Assessment & Plan Note (Signed)
Continue Synthroid.  Check TSH. 

## 2020-05-23 NOTE — Assessment & Plan Note (Signed)
Encouraged continued diet and exercise. 

## 2020-05-23 NOTE — Progress Notes (Signed)
[name], MD Phone: [phone]  Stephanie Shields is a 49 y.o. female who presents today for f/u.  HYPOTHYROIDISM Disease Monitoring Weight changes: up some  Skin Changes: no Heat/Cold intolerance: no  Medication Monitoring Compliance:  Taking synthroid   Last TSH:   Lab Results  Component Value Date   TSH 0.66 12/22/2018   GERD: Working on tapering off of [place] and is now on Prilosec to cover for a period of time and then she will come off of that.  She is doing this through GI.  Allergic rhinitis: Did have some issues this past allergy season that were worse than typical.  She would like to have azelastine on hand to use if needed in the future.  Anxiety/depression: She is doing very well with these things.  She is about to graduate from therapy.  No SI.  Obesity: Patient's weight did trend up some with the pandemic.  She is getting back to exercising at least every other day.  She is doing a rowing machine as well as Pilates and yoga.  They have been eating at home and she is trying to cook with whole foods.  She has decreased her carb intake as well.   Social History   Tobacco Use  Smoking Status Never Smoker  Smokeless Tobacco Never Used     ROS see history of present illness  Objective  Physical Exam Vitals:   05/23/20 1451  BP: 120/80  Pulse: 72  Temp: (!) 96.5 F (35.8 C)  SpO2: 97%    BP Readings from Last 3 Encounters:  05/23/20 120/80  03/26/20 111/61  02/01/20 99/63   Wt Readings from Last 3 Encounters:  05/23/20 223 lb 12.8 oz (101.5 kg)  03/26/20 226 lb 3.2 oz (102.6 kg)  02/15/20 220 lb (99.8 kg)    Physical Exam Constitutional:      General: She is not in acute distress.    Appearance: She is not diaphoretic.  Cardiovascular:     Rate and Rhythm: Normal rate and regular rhythm.     Heart sounds: Normal heart sounds.  Pulmonary:     Effort: Pulmonary effort is normal.     Breath sounds: Normal breath sounds.    Musculoskeletal:     Right lower leg: No edema.     Left lower leg: No edema.  Skin:    General: Skin is warm and dry.  Neurological:     Mental Status: She is alert.      Assessment/Plan: Please see individual problem list.  Allergic rhinitis Astelin sent to pharmacy to use on an as-needed basis.  She will monitor her symptoms.  GERD (gastroesophageal reflux disease) Doing well on Prilosec.  She will continue this through GI.  Hypothyroidism Continue Synthroid.  Check TSH.  Anxiety and depression Doing well.  She will continue to monitor.  Obesity Encouraged continued diet and exercise.  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   Health Maintenance: She reports she has an appointment for her Pap smear.  Orders Placed This Encounter  Procedures   Comp Met (CMET)   Lipid panel   HgB A1c   TSH    Meds ordered this encounter  Medications   azelastine (ASTELIN) 0.1 % nasal spray    Sig: Place 2 sprays into both nostrils 2 (two) times daily. Use in each nostril as directed    Dispense:  30 mL    Refill:  1  This visit occurred during the SARS-CoV-2 public health emergency.  Safety protocols were in place, including screening questions prior to the visit, additional usage of staff PPE, and extensive cleaning of exam room while observing appropriate contact time as indicated for disinfecting solutions.    [name], MD [place]

## 2020-05-23 NOTE — Patient Instructions (Signed)
Nice to see you. We will get labs today. I sent in the Astelin for you to take as needed for allergies.

## 2020-05-24 LAB — COMPREHENSIVE METABOLIC PANEL
ALT: 25 U/L (ref 0–35)
AST: 20 U/L (ref 0–37)
Albumin: 4.4 g/dL (ref 3.5–5.2)
Alkaline Phosphatase: 60 U/L (ref 39–117)
BUN: 18 mg/dL (ref 6–23)
CO2: 27 mEq/L (ref 19–32)
Calcium: 9.3 mg/dL (ref 8.4–10.5)
Chloride: 103 mEq/L (ref 96–112)
Creatinine, Ser: 0.79 mg/dL (ref 0.40–1.20)
GFR: 78.02 mL/min (ref >60.00)
Glucose, Bld: 120 mg/dL — ABNORMAL HIGH (ref 70–99)
Potassium: 4.3 mEq/L (ref 3.5–5.1)
Sodium: 137 mEq/L (ref 135–145)
Total Bilirubin: 0.4 mg/dL (ref 0.2–1.2)
Total Protein: 7.1 g/dL (ref 6.0–8.3)

## 2020-05-24 LAB — TSH: TSH: 1.01 u[IU]/mL (ref 0.35–4.50)

## 2020-05-24 LAB — LIPID PANEL
Cholesterol: 179 mg/dL (ref 0–200)
HDL: 36.8 mg/dL — ABNORMAL LOW (ref >39.00)
LDL Cholesterol: 104 mg/dL — ABNORMAL HIGH (ref 0–99)
NonHDL: 141.85
Total CHOL/HDL Ratio: 5
Triglycerides: 188 mg/dL — ABNORMAL HIGH (ref 0.0–149.0)
VLDL: 37.6 mg/dL (ref 0.0–40.0)

## 2020-05-24 LAB — HEMOGLOBIN A1C: Hgb A1c MFr Bld: 5.5 % (ref 4.6–6.5)

## 2020-06-04 DIAGNOSIS — G4733 Obstructive sleep apnea (adult) (pediatric): Secondary | ICD-10-CM | POA: Diagnosis not present

## 2020-06-06 ENCOUNTER — Ambulatory Visit (INDEPENDENT_AMBULATORY_CARE_PROVIDER_SITE_OTHER): Payer: BC Managed Care – PPO | Admitting: Psychology

## 2020-06-06 DIAGNOSIS — F4310 Post-traumatic stress disorder, unspecified: Secondary | ICD-10-CM

## 2020-06-06 DIAGNOSIS — F4323 Adjustment disorder with mixed anxiety and depressed mood: Secondary | ICD-10-CM

## 2020-06-11 DIAGNOSIS — S39012A Strain of muscle, fascia and tendon of lower back, initial encounter: Secondary | ICD-10-CM | POA: Diagnosis not present

## 2020-06-12 ENCOUNTER — Other Ambulatory Visit: Payer: Self-pay

## 2020-06-12 ENCOUNTER — Ambulatory Visit: Payer: BC Managed Care – PPO | Admitting: Podiatry

## 2020-06-12 DIAGNOSIS — B351 Tinea unguium: Secondary | ICD-10-CM

## 2020-06-12 DIAGNOSIS — M79675 Pain in left toe(s): Secondary | ICD-10-CM | POA: Diagnosis not present

## 2020-06-12 DIAGNOSIS — M722 Plantar fascial fibromatosis: Secondary | ICD-10-CM

## 2020-06-13 MED ORDER — TERBINAFINE HCL 250 MG PO TABS: 250.0000 mg | ORAL_TABLET | Freq: Every day | ORAL | 0 refills | Status: DC

## 2020-06-13 NOTE — Progress Notes (Signed)
   HPI: 49 y.o. female presenting today for follow-up evaluation regarding a history of plantar fasciitis the bilateral feet left worse than the right.  Patient states that in the past orthotics have helped significantly.  Over the past several months she has lost one of her orthotics.  She last received custom molded orthotics approximately 2 years ago and they have helped alleviate her heel pain.  Now that she is not wearing orthotics the pain is recurring. Patient also has a new complaint today associated to discoloration of the left hallux nail plate.  Patient denies using nail polish or nail lacquer on her toenail however she has noticed over the past several months the discoloration and thickening of the left hallux nail plate.  She denies trauma or injury.  She is concerned for possible toenail fungus.  Past Medical History:  Diagnosis Date  . Allergic rhinitis   . Anxiety   . AVM (arteriovenous malformation) brain    MRI'S SHOW CHRONIC HEMORRHAGE IN CEREBELLUM  . 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 Elevated cholesterol   . GERD (gastroesophageal reflux disease)   . History of blood transfusion   . Hypothyroidism   . Low blood pressure   . Pre-diabetes   . Urinary incontinence    After delivery of her recent baby     Physical Exam: General: The patient is alert and oriented x3 in no acute distress.  Dermatology: Skin is warm, dry and supple bilateral lower extremities. Negative for open lesions or macerations.  Hyperkeratotic discolored nail noted to the left hallux nail plate.  Vascular: Palpable pedal pulses bilaterally. No edema or erythema noted. Capillary refill within normal limits.  Neurological: Epicritic and protective threshold grossly intact bilaterally.   Musculoskeletal Exam: Range of motion within normal limits to all pedal and ankle joints bilateral. Muscle strength 5/5 in all groups bilateral.   There is some tenderness to palpation to the medial calcaneal tubercle bilateral heels left greater than right.  Assessment: 1.  Plantar fasciitis bilateral.  Left greater than right. 2.  Onychomycosis of toenail left hallux nail plate   Plan of Care:  1. Patient evaluated.  2.  Appointment with Pedorthist for custom molded orthotics 3.  Prescription for Lamisil 250 mg #90.  Patient otherwise healthy and denies any hepatic pathology or symptoms 4.  Return to clinic as needed  *Works as an Tree surgeon at [employer].      Edrick Kins, DPM [facility]  Dr. Edrick Kins, DPM    [address], [place] [address]                Office [phone]  Fax [phone]

## 2020-06-15 ENCOUNTER — Other Ambulatory Visit: Payer: Self-pay | Admitting: Family Medicine

## 2020-06-18 DIAGNOSIS — D225 Melanocytic nevi of trunk: Secondary | ICD-10-CM | POA: Diagnosis not present

## 2020-06-18 DIAGNOSIS — D2262 Melanocytic nevi of left upper limb, including shoulder: Secondary | ICD-10-CM | POA: Diagnosis not present

## 2020-06-18 DIAGNOSIS — D2261 Melanocytic nevi of right upper limb, including shoulder: Secondary | ICD-10-CM | POA: Diagnosis not present

## 2020-06-18 DIAGNOSIS — D2272 Melanocytic nevi of left lower limb, including hip: Secondary | ICD-10-CM | POA: Diagnosis not present

## 2020-06-20 ENCOUNTER — Ambulatory Visit (INDEPENDENT_AMBULATORY_CARE_PROVIDER_SITE_OTHER): Payer: BC Managed Care – PPO | Admitting: Orthotics

## 2020-06-20 ENCOUNTER — Other Ambulatory Visit: Payer: Self-pay

## 2020-06-20 ENCOUNTER — Ambulatory Visit: Payer: BC Managed Care – PPO | Admitting: Psychology

## 2020-06-20 DIAGNOSIS — M722 Plantar fascial fibromatosis: Secondary | ICD-10-CM | POA: Diagnosis not present

## 2020-06-20 DIAGNOSIS — M7522 Bicipital tendinitis, left shoulder: Secondary | ICD-10-CM

## 2020-06-20 IMAGING — MR MRA HEAD WITHOUT CONTRAST
1 series · 25 of 48 positions shown · IV contrast (gadavist)
Comparison: 05/21/2018. Other prior films as distant as July 2009.

CLINICAL DATA: Pulsatile tinnitus on the right

EXAM:
MRI HEAD WITHOUT AND WITH CONTRAST
MRA HEAD WITHOUT CONTRAST
TECHNIQUE: Multiplanar, multiecho pulse sequences of the brain and surrounding
structures were obtained without and with intravenous contrast.
Angiographic images of the head were obtained using MRA technique
without contrast.
CONTRAST:  9 cc Gadavist

[Series 5: tof_cs_(id) · axial · 0.5mm · 0.48mm/px · z∈[+5,+91]mm · 25 of 200 slices shown]
[im 1/200]
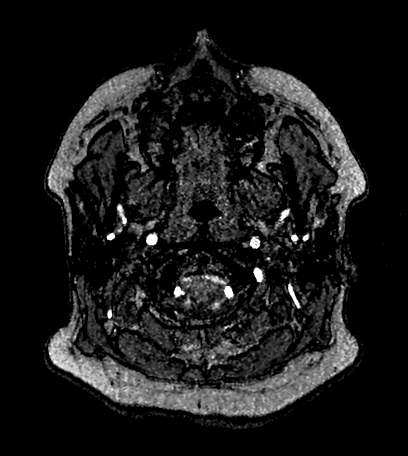
[im 5/200]
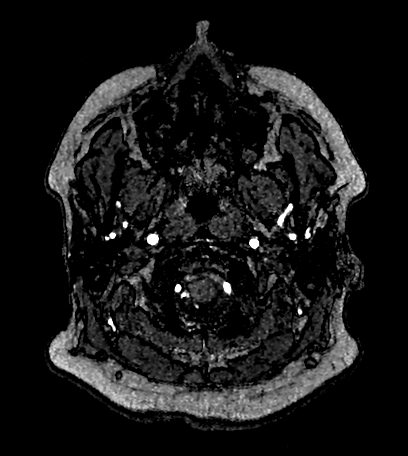
[im 9/200]
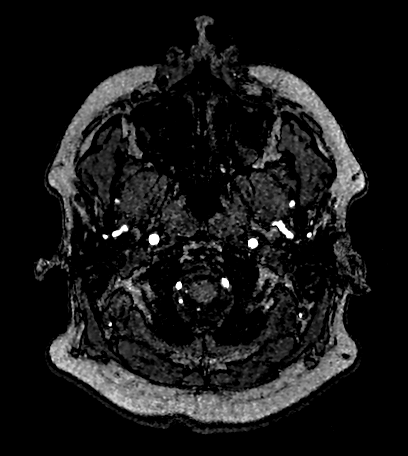
[im 13/200]
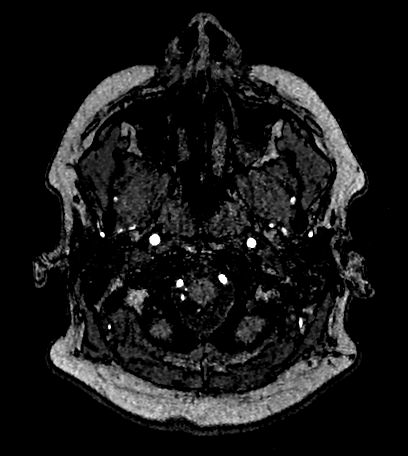
[im 17/200]
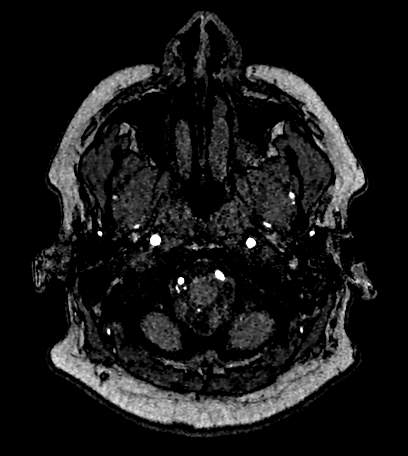
[im 22/200]
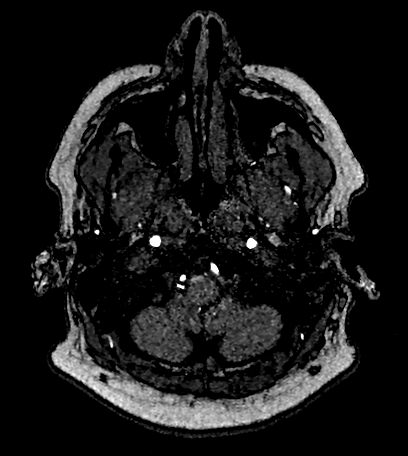
[im 26/200]
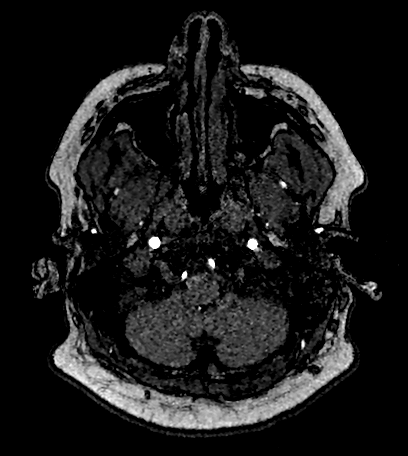
[im 30/200]
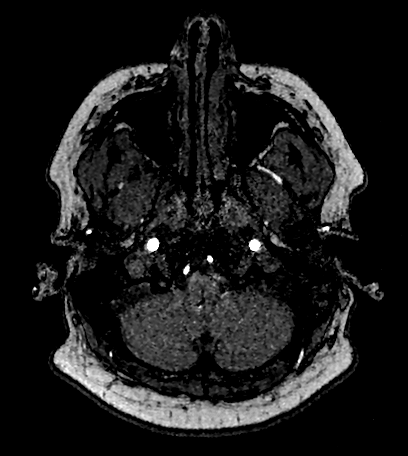
[im 34/200]
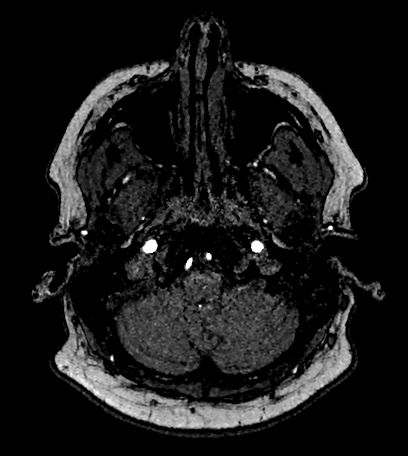
[im 39/200]
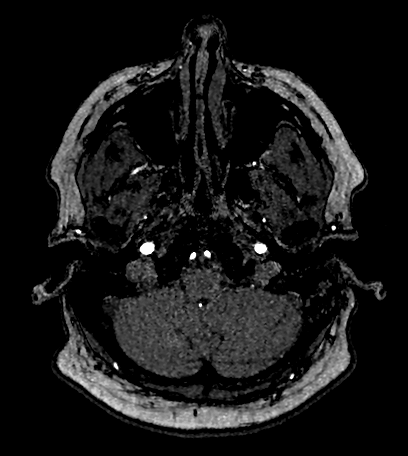
[im 43/200]
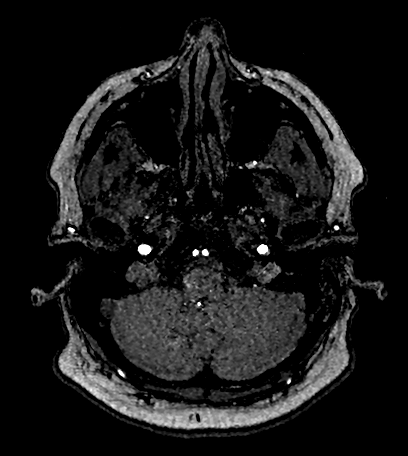
[im 47/200]
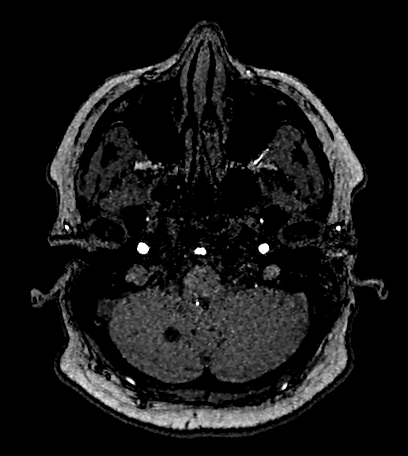
[im 51/200]
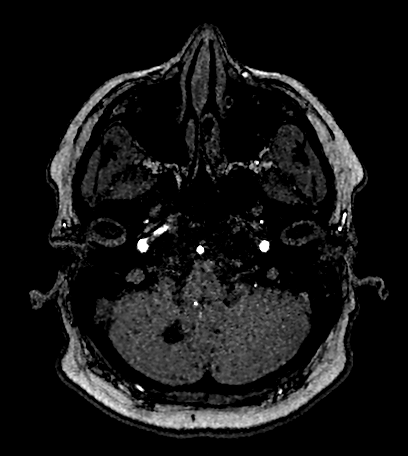
[im 56/200]
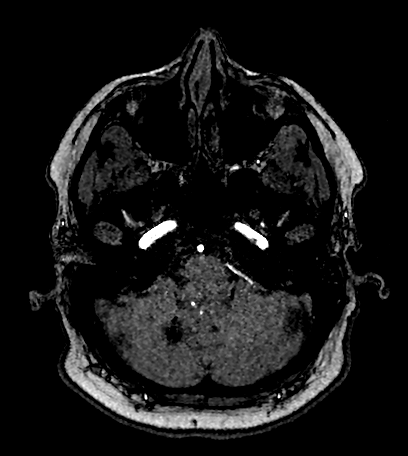
[im 60/200]
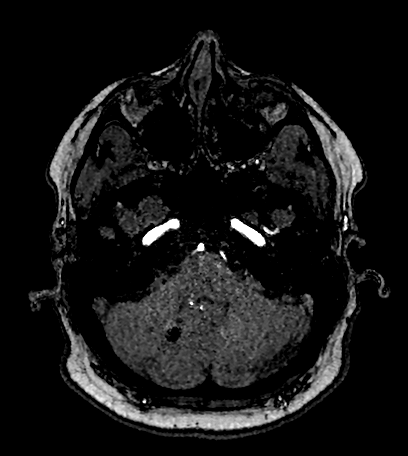
[im 64/200]
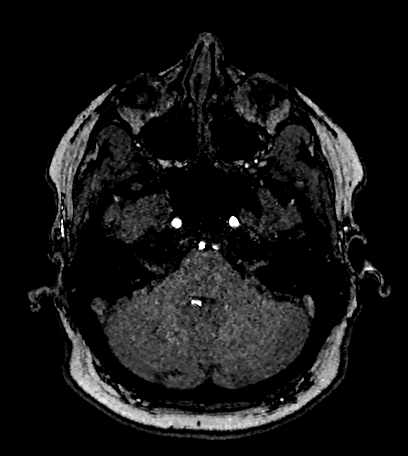
[im 68/200]
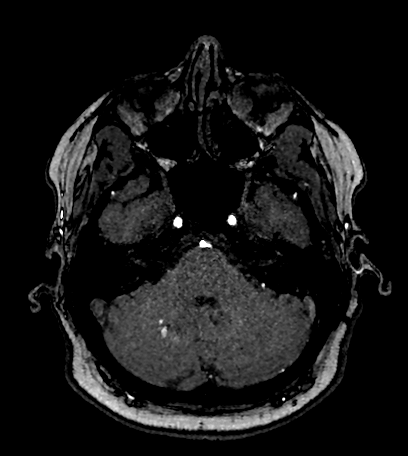
[im 72/200]
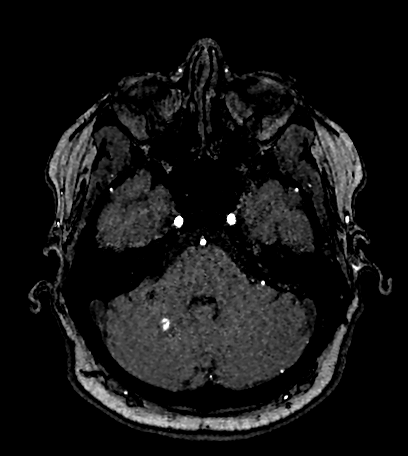
[im 89/200]
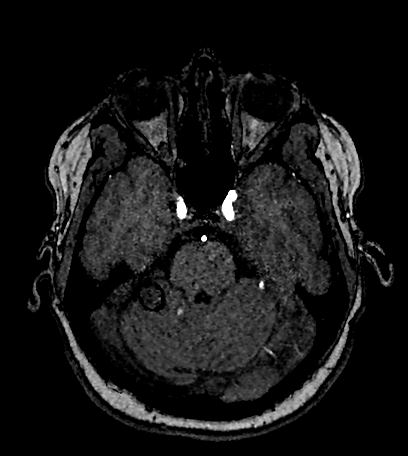
[im 102/200]
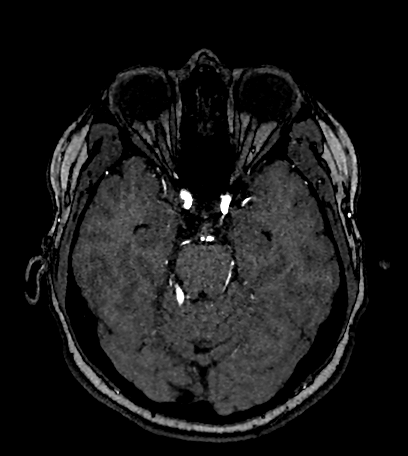
[im 115/200]
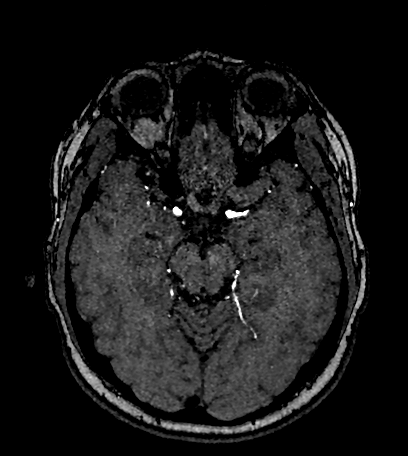
[im 140/200]
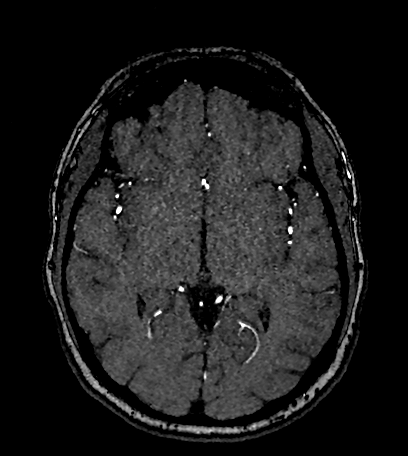
[im 166/200]
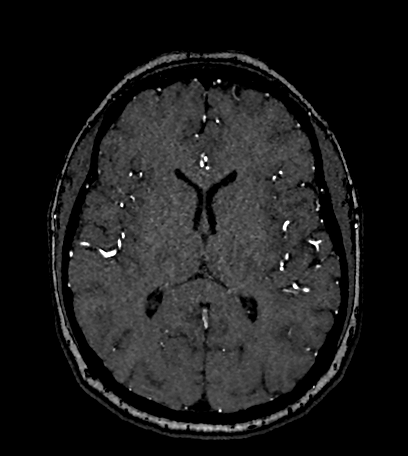
[im 170/200]
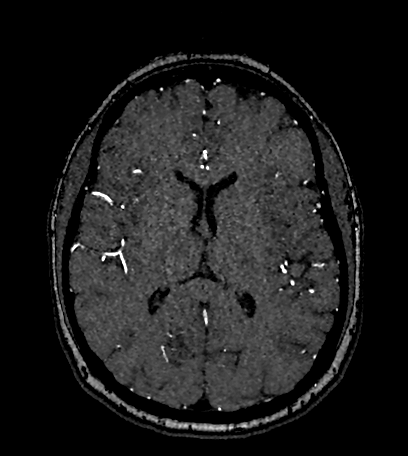
[im 191/200]
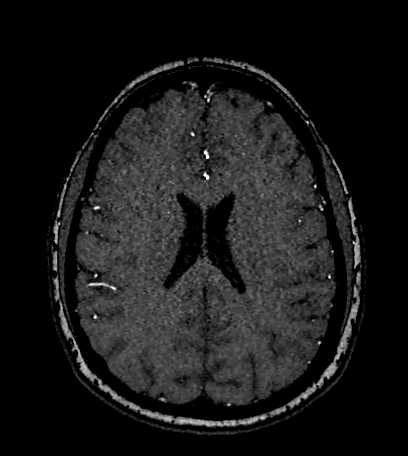

[25 of 48 positions shown; findings below may reference images not displayed]

FINDINGS: MRI HEAD FINDINGS

Brain: Diffusion imaging does not show any acute or subacute
infarction. The brainstem is normal. Chronic hemorrhagic
developmental venous abnormalities of the right cerebellum are
unchanged, with atypical cavernoma in the lateral anterior right
cerebellum measuring 14 mm in diameter and a mixed developmental
venous anomaly with components of venous angioma and cavernoma more
inferiorly within the right cerebellum. I do not see evidence of a
high flow component. Cerebral hemispheres are normal without
evidence of stroke, mass or hemorrhage. No hydrocephalus or
extra-axial collection. No abnormal brain enhancement occurs. No
vestibular schwannoma. No extra-axial vascular lesion. Prominent
draining vein from the developmental venous anomaly is present but
unchanged.

Vascular: No other vascular finding.

Skull and upper cervical spine: Negative

Sinuses/Orbits: Paranasal sinuses are clear. Bilateral mastoid
effusions could possibly be symptomatic.

Other: None

MRA HEAD FINDINGS

Both internal carotid arteries are widely patent into the brain. No
siphon stenosis. The anterior and middle cerebral vessels are patent
without proximal stenosis, aneurysm or vascular malformation.

Both vertebral arteries are widely patent to the basilar. No basilar
stenosis. Posterior circulation branch vessels appear normal. There
is some visible signal within the main central portions and draining
veins of the right cerebellar DVA, consistent with a relatively high
flow nature.
IMPRESSION: Stable appearance of the brain when compared to multiple previous
examinations. 14 mm cavernoma of the lateral right cerebellum.
Extensive developmental venous anomaly with components of venous
angioma and cavernoma in the central right cerebellum. No evidence
of recent change or additional hemorrhage. Amount of associated
hemosiderin appears stable.

There is a prominent draining vein from the right cerebellar
developmental venous anomaly extending into the perimesencephalic
cistern region on the right. MR angiography shows some signal within
this, indicating that, though venous, this is a relatively high flow
anomaly. It is possible that an arteriovenous fistula has developed
over time, particularly if there is a change in symptoms. Consider
neuro interventional referral. Catheter angiography may be necessary
to take advantage of the time resolved features of that examination,
which could differentiate fistula from baseline high-flow/rapid
transit nature.

Bilateral mastoid effusions which could possibly be symptomatic.

## 2020-06-20 NOTE — Progress Notes (Signed)

## 2020-06-29 DIAGNOSIS — Z01419 Encounter for gynecological examination (general) (routine) without abnormal findings: Secondary | ICD-10-CM | POA: Diagnosis not present

## 2020-06-29 DIAGNOSIS — Z1151 Encounter for screening for human papillomavirus (HPV): Secondary | ICD-10-CM | POA: Diagnosis not present

## 2020-06-29 DIAGNOSIS — Z975 Presence of (intrauterine) contraceptive device: Secondary | ICD-10-CM | POA: Diagnosis not present

## 2020-06-29 DIAGNOSIS — Z30431 Encounter for routine checking of intrauterine contraceptive device: Secondary | ICD-10-CM | POA: Diagnosis not present

## 2020-06-29 LAB — HM PAP SMEAR: HM Pap smear: NEGATIVE

## 2020-07-07 ENCOUNTER — Other Ambulatory Visit: Payer: Self-pay | Admitting: Family Medicine

## 2020-07-11 ENCOUNTER — Other Ambulatory Visit: Payer: Self-pay

## 2020-07-11 ENCOUNTER — Ambulatory Visit: Payer: BC Managed Care – PPO | Admitting: Orthotics

## 2020-07-11 DIAGNOSIS — M7522 Bicipital tendinitis, left shoulder: Secondary | ICD-10-CM

## 2020-07-11 DIAGNOSIS — M722 Plantar fascial fibromatosis: Secondary | ICD-10-CM

## 2020-07-11 NOTE — Progress Notes (Signed)
Patient came in today to pick up custom made foot orthotics.  The goals were accomplished and the patient reported no dissatisfaction with said orthotics.  Patient was advised of breakin period and how to report any issues. 

## 2020-07-29 ENCOUNTER — Other Ambulatory Visit: Payer: Self-pay | Admitting: Family Medicine

## 2020-08-01 ENCOUNTER — Other Ambulatory Visit: Payer: Self-pay | Admitting: Family Medicine

## 2020-08-15 ENCOUNTER — Other Ambulatory Visit: Payer: Self-pay | Admitting: Family Medicine

## 2020-08-15 DIAGNOSIS — Z1231 Encounter for screening mammogram for malignant neoplasm of breast: Secondary | ICD-10-CM

## 2020-08-24 ENCOUNTER — Other Ambulatory Visit: Payer: Self-pay

## 2020-08-24 ENCOUNTER — Ambulatory Visit: Payer: BC Managed Care – PPO | Admitting: Podiatry

## 2020-08-24 ENCOUNTER — Encounter: Payer: Self-pay | Admitting: Podiatry

## 2020-08-24 DIAGNOSIS — M722 Plantar fascial fibromatosis: Secondary | ICD-10-CM

## 2020-08-24 MED ORDER — MELOXICAM 15 MG PO TABS: 15.0000 mg | ORAL_TABLET | Freq: Every day | ORAL | 1 refills | Status: DC

## 2020-08-24 NOTE — Progress Notes (Signed)
   Subjective: 49 y.o. female presenting today for evaluation of left heel pain has been going on for a few months now.  Patient continues to wear her orthotics and wear the plantar fascial brace with minimal relief.  She has a longstanding history of plantar fasciitis more so on the left foot than the right.  Past Medical History:  Diagnosis Date  . Allergic rhinitis   . Anxiety   . AVM (arteriovenous malformation) brain    MRI'S SHOW CHRONIC HEMORRHAGE IN CEREBELLUM  . 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 Elevated cholesterol   . GERD (gastroesophageal reflux disease)   . History of blood transfusion   . Hypothyroidism   . Low blood pressure   . Pre-diabetes   . Urinary incontinence    After delivery of her recent baby     Objective: Physical Exam General: The patient is alert and oriented x3 in no acute distress.  Dermatology: Skin is warm, dry and supple bilateral lower extremities. Negative for open lesions or macerations bilateral.   Vascular: Dorsalis Pedis and Posterior Tibial pulses palpable bilateral.  Capillary fill time is immediate to all digits.  Neurological: Epicritic and protective threshold intact bilateral.   Musculoskeletal: Tenderness to palpation to the plantar aspect of the left heel along the plantar fascia. All other joints range of motion within normal limits bilateral. Strength 5/5 in all groups bilateral.    Assessment: 1. Plantar fasciitis left foot  Plan of Care:  1. Patient evaluated.   2. Injection of 0.5cc Celestone soluspan injected into the left plantar fascia.  3. Rx for Meloxicam ordered for patient. 5.  Continue custom molded orthotics and plantar fascial brace 6. Instructed patient regarding therapies and modalities at home to alleviate symptoms.  7. Return to clinic in 4 weeks.    *Works as an Tree surgeon at [employer], DPM [facility]  Dr. Edrick Kins, DPM    [address], [place] [address]                Office [phone]  Fax [phone]

## 2020-08-27 ENCOUNTER — Telehealth: Payer: Self-pay | Admitting: Family Medicine

## 2020-08-27 NOTE — Telephone Encounter (Signed)
Left message for patient to return call  to office, patient contacted Access Nurse on 08/26/20 concerning rash after taking Dayquil.

## 2020-08-27 NOTE — Telephone Encounter (Signed)
[facility] Night - Cl TELEPHONE ADVICE RECORD AccessNurse Patient Name: Stephanie Shields Gender: Female DOB: 1971/10/27 Age: 49 Y 2 M 17 D Return Phone Number: [phone] (Primary) Address: City/State/Zip: [place] [address] Client [facility] [place] Night - [facility] Client Site [place] Physician [name] - MD Contact Type Call Who Is Calling Patient / Member / Family / Caregiver Call Type Triage / Clinical Relationship To Patient Self Return Phone Number [phone] (Primary) Chief Complaint Headache Reason for Call Symptomatic / Request for Health Information Initial Comment Caller states she took Dayquil her arms are bright red and hot to the touch. She has headaches Taking dayquil for cough. . Translation No Nurse Assessment Nurse: Aris Lot, RN, Christina Date/Time (Eastern Time): 08/26/2020 12:47:22 AM Confirm and document reason for call. If symptomatic, describe symptoms. ---Caller states she her arms are bright red and hot to the touch. Caller stated that She has a headaches Taking Dayquil @ 4pm for cough. No other symptoms Does the patient have any new or worsening symptoms? ---Yes Will a triage be completed? ---Yes Related visit to physician within the last 2 weeks? ---No Does the PT have any chronic conditions? (i.e. diabetes, asthma, this includes High risk factors for pregnancy, etc.) ---Yes List chronic conditions. ---allergies, Is the patient pregnant or possibly pregnant? (Ask all females between the ages of 91-55) ---No Is this a behavioral health or substance abuse call? ---No Guidelines Guideline Title Affirmed Question Affirmed Notes Nurse Date/Time (Eastern Time) Rash or Redness - Widespread Mild widespread rash Aris Lot, RN, Christina 08/26/2020 12:51:08 AM Disp. Time Eilene Ghazi Time) Disposition Final User 08/26/2020 12:55:00 AM SEE PCP WITHIN 3 DAYS Yes Barr, RN,  Christina PLEASE NOTE: All timestamps contained within this report are represented as [place] Standard Time. CONFIDENTIALTY NOTICE: This fax transmission is intended only for the addressee. It contains information that is legally privileged, confidential or otherwise protected from use or disclosure. If you are not the intended recipient, you are strictly prohibited from reviewing, disclosing, copying using or disseminating any of this information or taking any action in reliance on or regarding this information. If you have received this fax in error, please notify us immediately by telephone so that we can arrange for its return to [place]. Phone: [phone], Toll-Free: [phone], Fax: [phone] Page: 2 of 2 Call Id: [id number] [place] Disagree/Comply Comply Caller Understands Yes PreDisposition InappropriateToAsk Care Advice Given Per Guideline SEE PCP WITHIN 3 DAYS: BENADRYL FOR ITCHING: * Take Benadryl (OTC diphenhydramine) 4 times per day until seen. * Adult dose is 25-50 mg PO. * Diphenhydramine (Benadryl) is a FIRST GENERATION ANTIHISTAMINE medicine. It causes more sleepiness than the newer second generation antihistamine medicines. The adult dosage of Benadryl is 25 to 50 mg by mouth and you can take it up to 4 times a day. * Sprinkle contents of one Aveeno packet under running faucet with comfortably warm water. Bathe for 15 - 20 minutes, 1-2 times daily. OATMEAL AVEENO BATH FOR ITCHING: CONTAGIOUSNESS: * Some rashes can be caused by viruses. Until your physician makes a diagnosis: * Avoid contact with pregnant women * Rash becomes purple or blood-colored or blister-like CALL BACK IF: * Fever occurs or severe itching * You become worse CARE ADVICE given per Rash - Widespread and Cause Unknown (Adult) guideline

## 2020-08-29 NOTE — Telephone Encounter (Signed)
Noted.  I would encourage her to get a Covid test just to rule that out as a possibility if she is not already done so.

## 2020-08-29 NOTE — Telephone Encounter (Signed)
Patient called back rash has resolved, has slight cough , thinks this is related to her child has a cold. Patient denies, fever, chills, or body aches , has tested negative for Covid. Offered virtual with NP patient declined at this time she will try Delsym or Robitussin OTC. Patient will callback and be evaluated if symptoms worsen.

## 2020-08-29 NOTE — Telephone Encounter (Signed)
Sorry, I missed that.

## 2020-08-29 NOTE — Telephone Encounter (Signed)
See previous just tested negative for COVID.

## 2020-09-04 DIAGNOSIS — G4733 Obstructive sleep apnea (adult) (pediatric): Secondary | ICD-10-CM | POA: Diagnosis not present

## 2020-09-10 ENCOUNTER — Ambulatory Visit
Admission: RE | Admit: 2020-09-10 | Discharge: 2020-09-10 | Disposition: A | Discharge status: Home / Self Care | Payer: BC Managed Care – PPO | Source: Ambulatory Visit | Attending: Family Medicine | Admitting: Family Medicine

## 2020-09-10 ENCOUNTER — Other Ambulatory Visit: Payer: Self-pay

## 2020-09-10 DIAGNOSIS — Z1231 Encounter for screening mammogram for malignant neoplasm of breast: Secondary | ICD-10-CM

## 2020-09-12 ENCOUNTER — Ambulatory Visit (INDEPENDENT_AMBULATORY_CARE_PROVIDER_SITE_OTHER): Payer: BC Managed Care – PPO | Admitting: Psychology

## 2020-09-12 DIAGNOSIS — F4310 Post-traumatic stress disorder, unspecified: Secondary | ICD-10-CM | POA: Diagnosis not present

## 2020-09-12 DIAGNOSIS — F330 Major depressive disorder, recurrent, mild: Secondary | ICD-10-CM | POA: Diagnosis not present

## 2020-09-13 DIAGNOSIS — Z30432 Encounter for removal of intrauterine contraceptive device: Secondary | ICD-10-CM | POA: Diagnosis not present

## 2020-09-13 DIAGNOSIS — Q283 Other malformations of cerebral vessels: Secondary | ICD-10-CM | POA: Diagnosis not present

## 2020-09-13 DIAGNOSIS — Z23 Encounter for immunization: Secondary | ICD-10-CM | POA: Diagnosis not present

## 2020-09-13 DIAGNOSIS — D1800 Hemangioma unspecified site: Secondary | ICD-10-CM | POA: Diagnosis not present

## 2020-09-13 DIAGNOSIS — G939 Disorder of brain, unspecified: Secondary | ICD-10-CM | POA: Diagnosis not present

## 2020-10-02 ENCOUNTER — Other Ambulatory Visit: Payer: Self-pay | Admitting: Family Medicine

## 2020-10-16 ENCOUNTER — Other Ambulatory Visit: Payer: Self-pay | Admitting: Family Medicine

## 2020-11-14 DIAGNOSIS — L821 Other seborrheic keratosis: Secondary | ICD-10-CM | POA: Diagnosis not present

## 2020-11-28 ENCOUNTER — Other Ambulatory Visit: Payer: Self-pay

## 2020-12-03 ENCOUNTER — Other Ambulatory Visit: Payer: Self-pay

## 2020-12-03 ENCOUNTER — Telehealth (INDEPENDENT_AMBULATORY_CARE_PROVIDER_SITE_OTHER): Payer: BC Managed Care – PPO | Admitting: Family Medicine

## 2020-12-03 DIAGNOSIS — G4733 Obstructive sleep apnea (adult) (pediatric): Secondary | ICD-10-CM | POA: Diagnosis not present

## 2020-12-03 DIAGNOSIS — H93A1 Pulsatile tinnitus, right ear: Secondary | ICD-10-CM

## 2020-12-03 DIAGNOSIS — K219 Gastro-esophageal reflux disease without esophagitis: Secondary | ICD-10-CM

## 2020-12-03 DIAGNOSIS — E038 Other specified hypothyroidism: Secondary | ICD-10-CM

## 2020-12-03 DIAGNOSIS — N393 Stress incontinence (female) (male): Secondary | ICD-10-CM

## 2020-12-03 DIAGNOSIS — E063 Autoimmune thyroiditis: Secondary | ICD-10-CM

## 2020-12-03 NOTE — Assessment & Plan Note (Signed)
Refer for pelvic floor PT.

## 2020-12-03 NOTE — Assessment & Plan Note (Signed)
Much improved with removing her IUD.  She has seen ENT for this.

## 2020-12-03 NOTE — Progress Notes (Signed)
Virtual Visit via video Note  This visit type was conducted due to national recommendations for restrictions regarding the COVID-19 pandemic (e.g. social distancing).  This format is felt to be most appropriate for this patient at this time.  All issues noted in this document were discussed and addressed.  No physical exam was performed (except for noted visual exam findings with Video Visits).   I connected with Stephanie Shields today at  8:30 AM EST by a video enabled telemedicine application and verified that I am speaking with the correct person using two identifiers. Location patient: home Location provider: work Persons participating in the virtual visit: patient, provider  I discussed the limitations, risks, security and privacy concerns of performing an evaluation and management service by telephone and the availability of in person appointments. I also discussed with the patient that there may be a patient responsible charge related to this service. The patient expressed understanding and agreed to proceed.   Reason for visit: f/u.  HPI: HYPOTHYROIDISM Disease Monitoring Skin Changes: no Heat/Cold intolerance: no  Medication Monitoring Compliance:  Taking synthroid   Last TSH:   Lab Results  Component Value Date   TSH 1.01 05/23/2020   OSA CPAP use: nightly, though notes it died last night Hypersomnia: no Well rested: no CPAP company: [employer]  GERD:   Reflux symptoms: 1x/week   Abd pain: no   Blood in stool: no  Dysphagia: no   EGD: mulitple  Medication: taking pepcid prn  Tinnitus: Patient notes she had her IUD taken out in October notes the tinnitus improved significantly with removal.  Stress incontinence: Patient notes she will leak a little bit after she goes to the bathroom though it is much worse when she has cold or allergy issues with coughing and sneezing.  Previously she has done pelvic floor physical therapy and notes that was quite helpful.  ROS: See  pertinent positives and negatives per HPI.  Past Medical History:  Diagnosis Date  . Allergic rhinitis   . Anxiety   . AVM (arteriovenous malformation) brain    MRI'S SHOW CHRONIC HEMORRHAGE IN CEREBELLUM  . 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 Elevated cholesterol   . GERD (gastroesophageal reflux disease)   . History of blood transfusion   . Hypothyroidism   . Low blood pressure   . Pre-diabetes   . Urinary incontinence    After delivery of her recent baby    Past Surgical History:  Procedure Laterality Date  . BICEPT TENODESIS  07/28/2019   Procedure: MINI OPEN BICEPS TENODESIS;  Surgeon: Corky Mull, MD;  Location: [facility] ORS;  Service: Orthopedics;;  . BLADDER SURGERY     as a child  . COLONOSCOPY WITH PROPOFOL N/A 02/01/2020   Procedure: COLONOSCOPY WITH PROPOFOL;  Surgeon: Lin Landsman, MD;  Location: [facility] ENDOSCOPY;  Service: Gastroenterology;  Laterality: N/A;  . ESOPHAGOGASTRODUODENOSCOPY (EGD) WITH PROPOFOL N/A 02/01/2020   Procedure: ESOPHAGOGASTRODUODENOSCOPY (EGD) WITH PROPOFOL;  Surgeon: Lin Landsman, MD;  Location: [facility] ENDOSCOPY;  Service: Gastroenterology;  Laterality: N/A;  . IR ANGIO INTRA EXTRACRAN SEL COM CAROTID INNOMINATE BILAT MOD SED  08/12/2019  . IR ANGIO VERTEBRAL SEL VERTEBRAL BILAT MOD SED  08/12/2019  . IR ANGIOGRAM EXTREMITY LEFT  08/12/2019  . IR US GUIDE VASC ACCESS RIGHT  08/12/2019  . MOUTH SURGERY    . NASAL SEPTUM SURGERY    . REFRACTIVE SURGERY    . SHOULDER  ARTHROSCOPY WITH SUBACROMIAL DECOMPRESSION, ROTATOR CUFF REPAIR AND BICEP TENDON REPAIR Left 07/28/2019   Procedure: SHOULDER ARTHROSCOPY WITH, DEBIRDEMENT, SUBACROMIAL DECOMPRESSION, MINI OPEN ROTATOR CUFF REPAIR;  Surgeon: Christena Flake, MD;  Location: [facility] ORS;  Service: Orthopedics;  Laterality: Left;  . TONSILLECTOMY      Family History  Problem Relation Age of Onset  . Arthritis Other        Parent,  Grandparent  . Prostate cancer Other        Grandparent  . Hyperlipidemia Other        Parent  . Heart disease Other        Grandparent  . Stroke Other        Grandparent  . Diabetes Other        Parent    SOCIAL HX: Non-smoker   Current Outpatient Medications:  .  azelastine (ASTELIN) 0.1 % nasal spray, PLACE 2 SPRAYS INTO BOTH NOSTRILS 2 (TWO) TIMES DAILY. USE IN EACH NOSTRIL AS DIRECTED, Disp: 30 mL, Rfl: 1 .  Cholecalciferol (VITAMIN D) 50 MCG (2000 UT) tablet, Take 2,000 Units by mouth daily at 3 pm., Disp: , Rfl:  .  levothyroxine (SYNTHROID) 150 MCG tablet, TAKE 1 TABLET (150 MCG TOTAL) BY MOUTH DAILY BEFORE BREAKFAST., Disp: 90 tablet, Rfl: 3 .  meloxicam (MOBIC) 15 MG tablet, Take 1 tablet (15 mg total) by mouth daily., Disp: 30 tablet, Rfl: 1 .  Probiotic CAPS, , Disp: , Rfl:  .  levonorgestrel (MIRENA) 20 MCG/24HR IUD, 1 each by Intrauterine route once. (Patient not taking: Reported on 12/03/2020), Disp: , Rfl:   EXAM:  VITALS per patient if applicable:  GENERAL: alert, oriented, appears well and in no acute distress  HEENT: atraumatic, conjunttiva clear, no obvious abnormalities on inspection of external nose and ears  NECK: normal movements of the head and neck  LUNGS: on inspection no signs of respiratory distress, breathing rate appears normal, no obvious gross SOB, gasping or wheezing  CV: no obvious cyanosis  MS: moves all visible extremities without noticeable abnormality  PSYCH/NEURO: pleasant and cooperative, no obvious depression or anxiety, speech and thought processing grossly intact  ASSESSMENT AND PLAN:  Discussed the following assessment and plan:  Problem List Items Addressed This Visit    GERD (gastroesophageal reflux disease)    Very well managed at this time on as needed Pepcid.  She can continue this.      Hypothyroidism    Stable.  She will continue Synthroid 150 MCG daily.      Obstructive sleep apnea    Symptomatically well  controlled.  She will contact [employer] regarding her CPAP ceasing to work.  I will also have my CMA contact [employer] to request a compliance report.      Stress incontinence    Refer for pelvic floor PT.      Relevant Orders   Ambulatory referral to Physical Therapy   Subjective pulsatile tinnitus of right ear    Much improved with removing her IUD.  She has seen ENT for this.          I discussed the assessment and treatment plan with the patient. The patient was provided an opportunity to ask questions and all were answered. The patient agreed with the plan and demonstrated an understanding of the instructions.   The patient was advised to call back or seek an in-person evaluation if the symptoms worsen or if the condition fails to improve as anticipated.    Marikay Alar, MD

## 2020-12-03 NOTE — Assessment & Plan Note (Signed)
Very well managed at this time on as needed Pepcid.  She can continue this.

## 2020-12-03 NOTE — Progress Notes (Signed)
Mirena removed in 10/21

## 2020-12-03 NOTE — Assessment & Plan Note (Signed)
Stable.  She will continue Synthroid 150 MCG daily.

## 2020-12-03 NOTE — Assessment & Plan Note (Signed)
Symptomatically well controlled.  She will contact [employer] regarding her CPAP ceasing to work.  I will also have my CMA contact [employer] to request a compliance report.

## 2020-12-05 DIAGNOSIS — G4733 Obstructive sleep apnea (adult) (pediatric): Secondary | ICD-10-CM | POA: Diagnosis not present

## 2021-01-02 ENCOUNTER — Ambulatory Visit: Payer: BC Managed Care – PPO | Attending: Family Medicine | Admitting: Physical Therapy

## 2021-01-02 ENCOUNTER — Other Ambulatory Visit: Payer: Self-pay

## 2021-01-02 DIAGNOSIS — M2170 Unequal limb length (acquired), unspecified site: Secondary | ICD-10-CM | POA: Diagnosis not present

## 2021-01-02 DIAGNOSIS — R2689 Other abnormalities of gait and mobility: Secondary | ICD-10-CM | POA: Diagnosis not present

## 2021-01-02 DIAGNOSIS — M533 Sacrococcygeal disorders, not elsewhere classified: Secondary | ICD-10-CM | POA: Diagnosis not present

## 2021-01-02 NOTE — Patient Instructions (Signed)
Try out shoe lift in R shoe this week, remove it to the house shoe   ___   Avoid straining pelvic floor, abdominal muscles , spine  Use log rolling technique instead of getting out of bed with your neck or the sit-up     Log rolling into and out of bed   Log rolling into and out of bed If getting out of bed on R side, Bent knees, scoot hips/ shoulder to L  Raise R arm completely overhead, rolling onto armpit  Then lower bent knees to bed to get into complete side lying position  Then drop legs off bed, and push up onto R elbow/forearm, and use L hand to push onto the bed    Proper body mechanics with getting out of a chair to decrease strain  on back &pelvic floor   Avoid holding your breath when Getting out of the chair:  Scoot to front part of chair chair Heels behind feet, feet are hip width apart, nose over toes  Inhale like you are smelling roses Exhale to stand

## 2021-01-03 NOTE — Therapy (Signed)
[place] MAIN [facility] SERVICES [address] [place], [place], [address] Phone: [phone]   Fax:  [phone]  Physical Therapy Evaluation  Patient Details  Name: Stephanie Shields MRN: [id number] Date of Birth: 1971-06-10 Referring Provider (PT): Biagio Quint, MD   Encounter Date: 01/02/2021   PT End of Session - 01/02/21 1333    Visit Number 1    Number of Visits 10    Date for PT Re-Evaluation 03/13/21    PT Start Time 1761    PT Stop Time 1400    PT Time Calculation (min) 57 min           Past Medical History:  Diagnosis Date  . Allergic rhinitis   . Anxiety   . AVM (arteriovenous malformation) brain    MRI'S SHOW CHRONIC HEMORRHAGE IN CEREBELLUM  . 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 Elevated cholesterol   . GERD (gastroesophageal reflux disease)   . History of blood transfusion   . Hypothyroidism   . Low blood pressure   . Pre-diabetes   . Urinary incontinence    After delivery of her recent baby    Past Surgical History:  Procedure Laterality Date  . BICEPT TENODESIS  07/28/2019   Procedure: MINI OPEN BICEPS TENODESIS;  Surgeon: Corky Mull, MD;  Location: [facility] ORS;  Service: Orthopedics;;  . BLADDER SURGERY     as a child  . COLONOSCOPY WITH PROPOFOL N/A 02/01/2020   Procedure: COLONOSCOPY WITH PROPOFOL;  Surgeon: Lin Landsman, MD;  Location: [facility] ENDOSCOPY;  Service: Gastroenterology;  Laterality: N/A;  . ESOPHAGOGASTRODUODENOSCOPY (EGD) WITH PROPOFOL N/A 02/01/2020   Procedure: ESOPHAGOGASTRODUODENOSCOPY (EGD) WITH PROPOFOL;  Surgeon: Lin Landsman, MD;  Location: [facility] ENDOSCOPY;  Service: Gastroenterology;  Laterality: N/A;  . IR ANGIO INTRA EXTRACRAN SEL COM CAROTID INNOMINATE BILAT MOD SED  08/12/2019  . IR ANGIO VERTEBRAL SEL VERTEBRAL BILAT MOD SED  08/12/2019  . IR ANGIOGRAM EXTREMITY LEFT  08/12/2019  . IR US GUIDE VASC ACCESS RIGHT   08/12/2019  . MOUTH SURGERY    . NASAL SEPTUM SURGERY    . REFRACTIVE SURGERY    . SHOULDER ARTHROSCOPY WITH SUBACROMIAL DECOMPRESSION, ROTATOR CUFF REPAIR AND BICEP TENDON REPAIR Left 07/28/2019   Procedure: SHOULDER ARTHROSCOPY WITH, DEBIRDEMENT, SUBACROMIAL DECOMPRESSION, MINI OPEN ROTATOR CUFF REPAIR;  Surgeon: Corky Mull, MD;  Location: [facility] ORS;  Service: Orthopedics;  Laterality: Left;  . TONSILLECTOMY      There were no vitals filed for this visit.    Subjective Assessment - 01/02/21 1317    Subjective 1) mixed incontinence: Pt went pelvic PT rehab in early 2018. Pt's 2nd child was born in 2016, 1st one in 2014. Pt had 80-90% improvements with urinary incontinence ( SUI, urge incontinence). Pt is no longer having the urgency any more. When she has an allergy or signficant cough, she experienced a regression. Also she has been working from home and is more sedentary, she notices more leakage. Pt goes through one pad per day. Pt has time in the evening to her exercises and she may have time in the morning for exercises.    2) B hip pain more in the back and on R side started 3 months ago which came on slowly. Denied radicular pain. Had sciatic pain after pregnancy. Throbbing Pain 2-3/10 comes on daily with lying down to sleep.      3) L plantar fasciitis for  20 years. Pt is seeing a Psychologist, sport and exercise at the end of Feb. Pain 6/10 to keeps her up at night and makes her limp. 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 Pt notices she shifts to the R leg to avoid L foot pain      Pertinent History Childhood bladder surgery. L RTC surgery with PT rehab. Pt enjoys rowing, walking.     Patient Stated Goals get fitness routineback, improve posture and continence              [facility] PT Assessment - 01/03/21 0909      Assessment   Medical Diagnosis SUI    Referring Provider (PT) Biagio Quint, MD      Precautions    Precautions None      Restrictions   Weight Bearing Restrictions No      Balance Screen   Has the patient fallen in the past 6 months No      [place] residence      Observation/Other Assessments   Observations ankles crossed      Coordination   Gross Motor Movements are Fluid and Coordinated --   chest breathing     AROM   Overall AROM Comments OKC DF L 10 deg, R 25 deg ,      Strength   Overall Strength Comments hip flex 5/5, B knee ext R 4-/5, L 5/5,  knee flex B 5/5, L hip abd 3/5, R 4/5 , L hip ext 4+/5, R 4-/5      Palpation   SI assessment  shoulder levelled, L iliac crest/ patella higher in standing, supine: ASIS levelled, L malleoli lower on L    Palpation comment hypomobile midfoot L   Hypomobile thoracic spine      Ambulation/Gait   Gait Comments excessive sway on R, L shoulder hiked up ( post Tx: with shoe lift in R shoe, more anterior rotation of R pelvis , L shoulder not higher )                      Objective measurements completed on examination: See above findings.       [facility] Adult PT Treatment/Exercise - 01/03/21 0853      Therapeutic Activites    Therapeutic Activities Other Therapeutic Activities    Other Therapeutic Activities accessed leg length, provided shoe lift , assessed gait      Neuro Re-ed    Neuro Re-ed Details  cued for body mechanics, sit to stand and log rolling                       PT Long Term Goals - 01/02/21 1335      PT LONG TERM GOAL #1   Title Pt will improve her FOTO score for foot pain ( to adminster at next session)      PT LONG TERM GOAL #2   Title Pt will improve FOTO for urinary problem score from 48 pts to > 58 pts in order to improve pelvic function and QOL    Time 10    Period Weeks    Status New    Target Date 03/14/21      PT LONG TERM GOAL #3   Title Pt will report noticing less L foot pain by 50% 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    Time 8    Period Weeks    Status New    Target Date 02/27/21      PT LONG TERM GOAL #4   Title Pt will report not needing a urinary pad across one week  in order to practice improvde hygiene and have pelvic floor control    Time 6    Period Weeks    Status New    Target Date 02/13/21      PT LONG TERM GOAL #5   Title Pt will demo equal pelvic girdle and shoulder height across 2 weeks with shoe lift in R shoe in order to minimzie foot pain and B hip pain and progress to deep core exercises for urinary Sx    Time 2    Status New    Target Date 01/17/21      Additional Long Term Goals   Additional Long Term Goals Yes      PT LONG TERM GOAL #6   Title Pt will demo increased L  DF AROM (OKC) from 10 deg to > 20 deg in order to promote improved gait, mobility in ankle. foot to minimize  plantar fasciitis and to stand for longer periods of time at work    Time 6    Period Weeks    Status New    Target Date 02/21/21      PT LONG TERM GOAL #7   Title Pt will demo proper deep core coordination and progress with pelvic floor contractions 3 sec, 3 reps in order to minimize leakage    Time 8    Period Weeks    Status New    Target Date 02/28/21                  Plan - 01/02/21 1334    Clinical Impression Statement  Pt is a  50 yo  who presents with mixed urinary incontinence, B hip pain, and L foot pain related to plantar fasciitis. These deficits impact her ADLs and QOL.   Pt's musculoskeletal assessment revealed uneven pelvic girdle and shoulder height, gait deviations, dyscoordination and strength of pelvic floor mm, weak hip weakness, limited L ankle DF AROM, hypomobility of thoracic spine, poor body mechanics which places strain on the abdominal/pelvic floor mm.  Suspect leg length difference is related to L plantar fasciitis and B hip pain and her presentation of uneven pelvic and shoulder  height/gait deviations. These are deficits that indicate an ineffective intraabdominal pressure system associated with increased risk for pt's urinary Sx and postural stability.    Pt was provided education on etiology of Sx with anatomy, physiology explanation with images along with the benefits of customized pelvic PT Tx based on pt's medical conditions and musculoskeletal deficits.  Explained the physiology of deep core mm coordination and roles of pelvic floor function in urination, defecation, sexual function, and postural control with deep core mm system.    Regional interdependent approaches will yield greater benefits in pt's POC due to the multiple body parts to address and leg length difference.   Following Tx today which pt tolerated without complaints, pt demo'd equal alignment of pelvic girdle height and improved gait when wearing shoe lift in R shoe. Plan to address thoracic spine at next session to promote improved deep core coordination and strengthening. Pt benefits from skilled PT      Examination-Activity Limitations Continence;Toileting;Stand    Stability/Clinical  Decision Making Evolving/Moderate complexity    Clinical Decision Making Moderate    Rehab Potential Good    PT Frequency 1x / week    PT Duration Other (comment)   10   PT Treatment/Interventions Moist Heat;Stair training;Neuromuscular re-education;Therapeutic activities;Therapeutic exercise;Functional mobility training;Patient/family education;Manual techniques;Balance training;Energy conservation;Splinting;Manual lymph drainage;Taping;Scar mobilization;Joint Manipulations;Spinal Manipulations;Gait training;Cryotherapy;Dry needling    Consulted and Agree with Plan of Care Patient           Patient will benefit from skilled therapeutic intervention in order to improve the following deficits and impairments:  Increased muscle spasms,Decreased mobility,Decreased coordination,Abnormal gait,Improper body  mechanics,Pain,Postural dysfunction,Decreased strength,Decreased range of motion,Decreased endurance,Decreased balance,Difficulty walking,Hypomobility,Increased fascial restricitons,Decreased scar mobility  Visit Diagnosis: Unequal leg length - Plan: PT plan of care cert/re-cert  Sacrococcygeal disorders, not elsewhere classified - Plan: PT plan of care cert/re-cert  Other abnormalities of gait and mobility - Plan: PT plan of care cert/re-cert     Problem List Patient Active Problem List   Diagnosis Date Noted  . Stress incontinence 12/03/2020  . Altered bowel habits   . Tendinitis of upper biceps tendon of left shoulder 07/28/2019  . Anxiety and depression 07/03/2019  . Obstructive sleep apnea 07/03/2019  . Subjective pulsatile tinnitus of right ear 07/03/2019  . Nontraumatic incomplete tear of left rotator cuff 05/23/2019  . Rotator cuff tendinitis, left 05/23/2019  . Stress 12/22/2018  . Loose stools 12/22/2018  . Clinically isolated syndrome of brainstem ([place]) 12/22/2018  . Rotator cuff impingement syndrome of left shoulder 06/21/2018  . Prediabetes 06/23/2017  . Carpal tunnel syndrome 06/23/2017  . Vitamin D deficiency 06/23/2017  . Recurrent sinusitis 12/24/2016  . Multiple benign nevi 12/24/2016  . Allergic rhinitis 06/19/2016  . Obesity 06/19/2016  . Hypothyroidism 11/20/2015  . GERD (gastroesophageal reflux disease) 11/20/2015    Jerl Mina ,PT, DPT, E-RYT  01/03/2021, 9:27 AM  [place] MAIN [facility] SERVICES [address] [place], [place], [address] Phone: [phone]   Fax:  [phone]  Name: Stephanie Shields MRN: [id number] Date of Birth: 09-08-1971

## 2021-01-09 ENCOUNTER — Ambulatory Visit: Payer: BC Managed Care – PPO | Admitting: Physical Therapy

## 2021-01-09 ENCOUNTER — Other Ambulatory Visit: Payer: Self-pay

## 2021-01-09 DIAGNOSIS — M533 Sacrococcygeal disorders, not elsewhere classified: Secondary | ICD-10-CM

## 2021-01-09 DIAGNOSIS — M2170 Unequal limb length (acquired), unspecified site: Secondary | ICD-10-CM

## 2021-01-09 DIAGNOSIS — R2689 Other abnormalities of gait and mobility: Secondary | ICD-10-CM | POA: Diagnosis not present

## 2021-01-09 NOTE — Patient Instructions (Signed)
  Lengthen Back rib by R  shoulder    Lie on L  side , pillow between knees and under head  Pull  arm overhead over mattress, grab the edge of mattress,pull it upward, drawing elbow away from ears  Breathing 10 reps  Open book ( Lying on  R_ side , rotating  _L_ only this week  Rotating onto pillow /yoga block  Pillow/ Block between knees  10 reps  __   Deep core level 1 and 2 ( handout)   __  Catch yourself when standing with more forward Center of mass over ballmounds , not locked knees and weight on heels

## 2021-01-09 NOTE — Therapy (Signed)
[place] MAIN [facility] SERVICES [address] [place], [place], [address] Phone: [phone]   Fax:  [phone]  Physical Therapy Treatment  Patient Details  Name: Stephanie Shields MRN: [id number] Date of Birth: 06-27-1971 Referring Provider (PT): Biagio Quint, MD   Encounter Date: 01/09/2021   PT End of Session - 01/09/21 1406    Visit Number 2    Number of Visits 10    Date for PT Re-Evaluation 03/13/21    PT Start Time 1203    PT Stop Time 1304    PT Time Calculation (min) 61 min           Past Medical History:  Diagnosis Date  . Allergic rhinitis   . Anxiety   . AVM (arteriovenous malformation) brain    MRI'S SHOW CHRONIC HEMORRHAGE IN CEREBELLUM  . 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 Elevated cholesterol   . GERD (gastroesophageal reflux disease)   . History of blood transfusion   . Hypothyroidism   . Low blood pressure   . Pre-diabetes   . Urinary incontinence    After delivery of her recent baby    Past Surgical History:  Procedure Laterality Date  . BICEPT TENODESIS  07/28/2019   Procedure: MINI OPEN BICEPS TENODESIS;  Surgeon: Corky Mull, MD;  Location: [facility] ORS;  Service: Orthopedics;;  . BLADDER SURGERY     as a child  . COLONOSCOPY WITH PROPOFOL N/A 02/01/2020   Procedure: COLONOSCOPY WITH PROPOFOL;  Surgeon: Lin Landsman, MD;  Location: [facility] ENDOSCOPY;  Service: Gastroenterology;  Laterality: N/A;  . ESOPHAGOGASTRODUODENOSCOPY (EGD) WITH PROPOFOL N/A 02/01/2020   Procedure: ESOPHAGOGASTRODUODENOSCOPY (EGD) WITH PROPOFOL;  Surgeon: Lin Landsman, MD;  Location: [facility] ENDOSCOPY;  Service: Gastroenterology;  Laterality: N/A;  . IR ANGIO INTRA EXTRACRAN SEL COM CAROTID INNOMINATE BILAT MOD SED  08/12/2019  . IR ANGIO VERTEBRAL SEL VERTEBRAL BILAT MOD SED  08/12/2019  . IR ANGIOGRAM EXTREMITY LEFT  08/12/2019  . IR US GUIDE VASC ACCESS RIGHT   08/12/2019  . MOUTH SURGERY    . NASAL SEPTUM SURGERY    . REFRACTIVE SURGERY    . SHOULDER ARTHROSCOPY WITH SUBACROMIAL DECOMPRESSION, ROTATOR CUFF REPAIR AND BICEP TENDON REPAIR Left 07/28/2019   Procedure: SHOULDER ARTHROSCOPY WITH, DEBIRDEMENT, SUBACROMIAL DECOMPRESSION, MINI OPEN ROTATOR CUFF REPAIR;  Surgeon: Corky Mull, MD;  Location: [facility] ORS;  Service: Orthopedics;  Laterality: Left;  . TONSILLECTOMY      There were no vitals filed for this visit.   Subjective Assessment - 01/09/21 1304    Subjective Pt reported she expereinced 15-20% less hip pain this week with shoe lift in R shoe.    Pertinent History Childhood bladder surgery. L RTC surgery with PT rehab. Pt enjoys rowing, walk.    Patient Stated Goals get fitness routineback, improve posture and continence              [facility] PT Assessment - 01/09/21 1307      Strength   Overall Strength Comments hip ext L 4-/5, R 5/5 hip abd  L 4-/5, R 5/5      Palpation   SI assessment  iliac crest and shoulders levelled,    Palpation comment increased tightness at L medial scapula, parspinals, posterior intercostals.  L foot midfoot hypomobile between intermediate and lateral cuneiform L , DAB/PAD  [facility] Adult PT Treatment/Exercise - 01/09/21 1412      Neuro Re-ed    Neuro Re-ed Details  cued for L stretches for upper quadrant and deep croe technique, cued for less chest breathing      Moist Heat Therapy   Number Minutes Moist Heat 5 Minutes    Moist Heat Location Other (comment)   thoracic during isntruction for HEP     Manual Therapy   Manual therapy comments STM/MWM and PA mob/ AP mob at midfoot to promote more toe abduction, DF/EV L and mobility at midfoot joints.  STM/MWM at L upper quadrant noted in assessments to promote more diaphragmatic excursion                       PT Long Term Goals - 01/09/21 1409      PT LONG TERM GOAL #1   Title Pt will improve her  FOTO score for foot pain from 42 pts to > 53 pts in order walk and play with kids    Time 8    Period Weeks    Status New    Target Date 03/06/21      PT LONG TERM GOAL #2   Title Pt will improve FOTO for urinary problem score from 48 pts to > 58 pts in order to improve pelvic function and QOL    Time 10    Period Weeks    Status New      PT LONG TERM GOAL #3   Title Pt will report noticing less L foot 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    Time 8    Period Weeks    Status New      PT LONG TERM GOAL #4   Title Pt will report notneeding a urinary pad across one week  in order to practice improvde hygiene and have pelvic floor control    Time 6    Period Weeks    Status New      PT LONG TERM GOAL #5   Title Pt will demo equal pelvic girdle and shoulder height across 2 weeks with shoe lift in R shoe in order to minimzie foot pain and B hip pain and progress to deep core exercises for urinary Sx    Time 2    Status New      PT LONG TERM GOAL #6   Title Pt will demo increased L  DF AROM (OKC) from 10 deg to > 20 deg in order to promote improved gait, mobility in ankle. foot to minimize  plantar fasciitis and to stand for longer periods of time at work    Time 6    Period Weeks    Status New      PT LONG TERM GOAL #7   Title Pt will demo proper deep core coordination and progress with pelvic floor contractions 3 sec, 3 reps in order to minimize leakage    Time 8    Period Weeks    Status New                 Plan - 01/09/21 1414    Clinical Impression Statement Pt demo'd equal pelvic girdle today with shoe lift in R shoe and pt reported decreased hip pain. Further addressed L upper quadrant deviation and L midfoot hypomobility. Pt demo'd improved diaphragmatic excursion and  increased midfoot mobility postTx. Pt required cues for deep core coordination with less chest breathing . This  new HEP will help with her urinary issues and postural stability.  Pt also required cues for propioception of lower kinetic chain to minimzie hyperextended knees in gait . Pt continues to benefit from skilled PT with use of regional interdependent approach given multiple regions affected by leg lengteh difference and spinal deviations.    Examination-Activity Limitations Continence;Toileting;Stand    Stability/Clinical Decision Making Evolving/Moderate complexity    Rehab Potential Good    PT Frequency 1x / week    PT Duration Other (comment)   10   PT Treatment/Interventions Moist Heat;Stair training;Neuromuscular re-education;Therapeutic activities;Therapeutic exercise;Functional mobility training;Patient/family education;Manual techniques;Balance training;Energy conservation;Splinting;Manual lymph drainage;Taping;Scar mobilization;Joint Manipulations;Spinal Manipulations;Gait training;Cryotherapy;Dry needling    Consulted and Agree with Plan of Care Patient           Patient will benefit from skilled therapeutic intervention in order to improve the following deficits and impairments:  Increased muscle spasms,Decreased mobility,Decreased coordination,Abnormal gait,Improper body mechanics,Pain,Postural dysfunction,Decreased strength,Decreased range of motion,Decreased endurance,Decreased balance,Difficulty walking,Hypomobility,Increased fascial restricitons,Decreased scar mobility  Visit Diagnosis: Unequal leg length  Sacrococcygeal disorders, not elsewhere classified  Other abnormalities of gait and mobility     Problem List Patient Active Problem List   Diagnosis Date Noted  . Stress incontinence 12/03/2020  . Altered bowel habits   . Tendinitis of upper biceps tendon of left shoulder 07/28/2019  . Anxiety and depression 07/03/2019  . Obstructive sleep apnea 07/03/2019  . Subjective pulsatile tinnitus of right ear 07/03/2019  . Nontraumatic incomplete tear of left rotator cuff  05/23/2019  . Rotator cuff tendinitis, left 05/23/2019  . Stress 12/22/2018  . Loose stools 12/22/2018  . Clinically isolated syndrome of brainstem ([place]) 12/22/2018  . Rotator cuff impingement syndrome of left shoulder 06/21/2018  . Prediabetes 06/23/2017  . Carpal tunnel syndrome 06/23/2017  . Vitamin D deficiency 06/23/2017  . Recurrent sinusitis 12/24/2016  . Multiple benign nevi 12/24/2016  . Allergic rhinitis 06/19/2016  . Obesity 06/19/2016  . Hypothyroidism 11/20/2015  . GERD (gastroesophageal reflux disease) 11/20/2015    Jerl Mina ,PT, DPT, E-RYT  01/09/2021, 2:18 PM  [place] MAIN [facility] SERVICES [address] [place], [place], [address] Phone: [phone]   Fax:  [phone]  Name: Stephanie Shields MRN: [id number] Date of Birth: [birth year]

## 2021-01-16 ENCOUNTER — Ambulatory Visit (INDEPENDENT_AMBULATORY_CARE_PROVIDER_SITE_OTHER): Payer: BC Managed Care – PPO | Admitting: Psychology

## 2021-01-16 DIAGNOSIS — F4310 Post-traumatic stress disorder, unspecified: Secondary | ICD-10-CM

## 2021-01-16 DIAGNOSIS — F330 Major depressive disorder, recurrent, mild: Secondary | ICD-10-CM | POA: Diagnosis not present

## 2021-01-25 DIAGNOSIS — M79672 Pain in left foot: Secondary | ICD-10-CM | POA: Diagnosis not present

## 2021-01-25 DIAGNOSIS — M19072 Primary osteoarthritis, left ankle and foot: Secondary | ICD-10-CM | POA: Diagnosis not present

## 2021-01-25 DIAGNOSIS — M722 Plantar fascial fibromatosis: Secondary | ICD-10-CM | POA: Diagnosis not present

## 2021-01-28 ENCOUNTER — Encounter: Payer: Self-pay | Admitting: Family Medicine

## 2021-01-31 ENCOUNTER — Encounter: Payer: Self-pay | Admitting: Internal Medicine

## 2021-01-31 ENCOUNTER — Ambulatory Visit: Payer: BC Managed Care – PPO | Admitting: Internal Medicine

## 2021-01-31 ENCOUNTER — Other Ambulatory Visit: Payer: Self-pay

## 2021-01-31 VITALS — BP 106/66 | HR 57 | Temp 97.8°F | Ht 66.0 in | Wt 235.6 lb

## 2021-01-31 DIAGNOSIS — E559 Vitamin D deficiency, unspecified: Secondary | ICD-10-CM

## 2021-01-31 DIAGNOSIS — Z13220 Encounter for screening for lipoid disorders: Secondary | ICD-10-CM

## 2021-01-31 DIAGNOSIS — Z1329 Encounter for screening for other suspected endocrine disorder: Secondary | ICD-10-CM

## 2021-01-31 DIAGNOSIS — Z1389 Encounter for screening for other disorder: Secondary | ICD-10-CM

## 2021-01-31 DIAGNOSIS — Z1231 Encounter for screening mammogram for malignant neoplasm of breast: Secondary | ICD-10-CM | POA: Diagnosis not present

## 2021-01-31 DIAGNOSIS — K429 Umbilical hernia without obstruction or gangrene: Secondary | ICD-10-CM | POA: Diagnosis not present

## 2021-01-31 DIAGNOSIS — R1084 Generalized abdominal pain: Secondary | ICD-10-CM

## 2021-01-31 DIAGNOSIS — R739 Hyperglycemia, unspecified: Secondary | ICD-10-CM

## 2021-01-31 DIAGNOSIS — Z0000 Encounter for general adult medical examination without abnormal findings: Secondary | ICD-10-CM

## 2021-01-31 DIAGNOSIS — R1905 Periumbilic swelling, mass or lump: Secondary | ICD-10-CM | POA: Diagnosis not present

## 2021-01-31 NOTE — Progress Notes (Signed)
Patient having a lump near the navel area of the abdomen. Patient states she noticed this a long time ago however soreness started on Monday. Pain is rated 1/10.   Patient has no history of abdominal injuries or surgeries. Patient has no history of issues with any abdominal organs. Patient not having any nausea, vomiting, urinary issues, low back pain, or stool issues.

## 2021-01-31 NOTE — Patient Instructions (Addendum)
Gastroesophageal Reflux Disease, Adult Gastroesophageal reflux (GER) happens when acid from the stomach flows up into the tube that connects the mouth and the stomach (esophagus). Normally, food travels down the esophagus and stays in the stomach to be digested. However, when a person has GER, food and stomach acid sometimes move back up into the esophagus. If this becomes a more serious problem, the person may be diagnosed with a disease called gastroesophageal reflux disease (GERD). GERD occurs when the reflux:  Happens often.  Causes frequent or severe symptoms.  Causes problems such as damage to the esophagus. When stomach acid comes in contact with the esophagus, the acid may cause inflammation in the esophagus. Over time, GERD may create small holes (ulcers) in the lining of the esophagus. What are the causes? This condition is caused by a problem with the muscle between the esophagus and the stomach (lower esophageal sphincter, or LES). Normally, the LES muscle closes after food passes through the esophagus to the stomach. When the LES is weakened or abnormal, it does not close properly, and that allows food and stomach acid to go back up into the esophagus. The LES can be weakened by certain dietary substances, medicines, and medical conditions, including:  Tobacco use.  Pregnancy.  Having a hiatal hernia.  Alcohol use.  Certain foods and beverages, such as coffee, chocolate, onions, and peppermint. What increases the risk? You are more likely to develop this condition if you:  Have an increased body weight.  Have a connective tissue disorder.  Take NSAIDs, such as ibuprofen. What are the signs or symptoms? Symptoms of this condition include:  Heartburn.  Difficult or painful swallowing and the feeling of having a lump in the throat.  A bitter taste in the mouth.  Bad breath and having a large amount of saliva.  Having an upset or bloated stomach and  belching.  Chest pain. Different conditions can cause chest pain. Make sure you see your health care provider if you experience chest pain.  Shortness of breath or wheezing.  Ongoing (chronic) cough or a nighttime cough.  Wearing away of tooth enamel.  Weight loss. How is this diagnosed? This condition may be diagnosed based on a medical history and a physical exam. To determine if you have mild or severe GERD, your health care provider may also monitor how you respond to treatment. You may also have tests, including:  A test to examine your stomach and esophagus with a small camera (endoscopy).  A test that measures the acidity level in your esophagus.  A test that measures how much pressure is on your esophagus.  A barium swallow or modified barium swallow test to show the shape, size, and functioning of your esophagus. How is this treated? Treatment for this condition may vary depending on how severe your symptoms are. Your health care provider may recommend:  Changes to your diet.  Medicine.  Surgery. The goal of treatment is to help relieve your symptoms and to prevent complications. Follow these instructions at home: Eating and drinking  Follow a diet as recommended by your health care provider. This may involve avoiding foods and drinks such as: ? Coffee and tea, with or without caffeine. ? Drinks that contain alcohol. ? Energy drinks and sports drinks. ? Carbonated drinks or sodas. ? Chocolate and cocoa. ? Peppermint and mint flavorings. ? Garlic and onions. ? Horseradish. ? Spicy and acidic foods, including peppers, chili powder, curry powder, vinegar, hot sauces, and barbecue sauce. ?  Citrus fruit juices and citrus fruits, such as oranges, lemons, and limes. ? Tomato-based foods, such as red sauce, chili, salsa, and pizza with red sauce. ? Fried and fatty foods, such as donuts, french fries, potato chips, and high-fat dressings. ? High-fat meats, such as hot  dogs and fatty cuts of red and white meats, such as rib eye steak, sausage, ham, and bacon. ? High-fat dairy items, such as whole milk, butter, and cream cheese.  Eat small, frequent meals instead of large meals.  Avoid drinking large amounts of liquid with your meals.  Avoid eating meals during the 2-3 hours before bedtime.  Avoid lying down right after you eat.  Do not exercise right after you eat.   Lifestyle  Do not use any products that contain nicotine or tobacco. These products include cigarettes, chewing tobacco, and vaping devices, such as e-cigarettes. If you need help quitting, ask your health care provider.  Try to reduce your stress by using methods such as yoga or meditation. If you need help reducing stress, ask your health care provider.  If you are overweight, reduce your weight to an amount that is healthy for you. Ask your health care provider for guidance about a safe weight loss goal.   General instructions  Pay attention to any changes in your symptoms.  Take over-the-counter and prescription medicines only as told by your health care provider. Do not take aspirin, ibuprofen, or other NSAIDs unless your health care provider told you to take these medicines.  Wear loose-fitting clothing. Do not wear anything tight around your waist that causes pressure on your abdomen.  Raise (elevate) the head of your bed about 6 inches (15 cm). You can use a wedge to do this.  Avoid bending over if this makes your symptoms worse.  Keep all follow-up visits. This is important. Contact a health care provider if:  You have: ? New symptoms. ? Unexplained weight loss. ? Difficulty swallowing or it hurts to swallow. ? Wheezing or a persistent cough. ? A hoarse voice.  Your symptoms do not improve with treatment. Get help right away if:  You have sudden pain in your arms, neck, jaw, teeth, or back.  You suddenly feel sweaty, dizzy, or light-headed.  You have chest pain  or shortness of breath.  You vomit and the vomit is green, yellow, or black, or it looks like blood or coffee grounds.  You faint.  You have stool that is red, bloody, or black.  You cannot swallow, drink, or eat. These symptoms may represent a serious problem that is an emergency. Do not wait to see if the symptoms will go away. Get medical help right away. Call your local emergency services (911 in the [place] Do not drive yourself to the hospital. Summary  Gastroesophageal reflux happens when acid from the stomach flows up into the esophagus. GERD is a disease in which the reflux happens often, causes frequent or severe symptoms, or causes problems such as damage to the esophagus.  Treatment for this condition may vary depending on how severe your symptoms are. Your health care provider may recommend diet and lifestyle changes, medicine, or surgery.  Contact a health care provider if you have new or worsening symptoms.  Take over-the-counter and prescription medicines only as told by your health care provider. Do not take aspirin, ibuprofen, or other NSAIDs unless your health care provider told you to do so.  Keep all follow-up visits as told by your health care provider. This  is important. This information is not intended to replace advice given to you by your health care provider. Make sure you discuss any questions you have with your health care provider. Document Revised: 05/28/2020 Document Reviewed: 05/28/2020 Elsevier Patient Education  2021 [place] for Gastroesophageal Reflux Disease, Adult When you have gastroesophageal reflux disease (GERD), the foods you eat and your eating habits are very important. Choosing the right foods can help ease the discomfort of GERD. Consider working with a dietitian to help you make healthy food choices. What are tips for following this plan? Reading food labels  Look for foods that are low in saturated fat. Foods that have  less than 5% of daily value (DV) of fat and 0 g of trans fats may help with your symptoms. Cooking  Cook foods using methods other than frying. This may include baking, steaming, grilling, or broiling. These are all methods that do not need a lot of fat for cooking.  To add flavor, try to use herbs that are low in spice and acidity. Meal planning  Choose healthy foods that are low in fat, such as fruits, vegetables, whole grains, low-fat dairy products, lean meats, fish, and poultry.  Eat frequent, small meals instead of three large meals each day. Eat your meals slowly, in a relaxed setting. Avoid bending over or lying down until 2-3 hours after eating.  Limit high-fat foods such as fatty meats or fried foods.  Limit your intake of fatty foods, such as oils, butter, and shortening.  Avoid the following as told by your health care provider: ? Foods that cause symptoms. These may be different for different people. Keep a food diary to keep track of foods that cause symptoms. ? Alcohol. ? Drinking large amounts of liquid with meals. ? Eating meals during the 2-3 hours before bed.   Lifestyle  Maintain a healthy weight. Ask your health care provider what weight is healthy for you. If you need to lose weight, work with your health care provider to do so safely.  Exercise for at least 30 minutes on 5 or more days each week, or as told by your health care provider.  Avoid wearing clothes that fit tightly around your waist and chest.  Do not use any products that contain nicotine or tobacco. These products include cigarettes, chewing tobacco, and vaping devices, such as e-cigarettes. If you need help quitting, ask your health care provider.  Sleep with the head of your bed raised. Use a wedge under the mattress or blocks under the bed frame to raise the head of the bed.  Chew sugar-free gum after mealtimes. What foods should I eat? Eat a healthy, well-balanced diet of fruits, vegetables,  whole grains, low-fat dairy products, lean meats, fish, and poultry. Each person is different. Foods that may trigger symptoms in one person may not trigger any symptoms in another person. Work with your health care provider to identify foods that are safe for you. The items listed above may not be a complete list of recommended foods and beverages. Contact a dietitian for more information.   What foods should I avoid? Limiting some of these foods may help manage the symptoms of GERD. Everyone is different. Consult a dietitian or your health care provider to help you identify the exact foods to avoid, if any. Fruits Any fruits prepared with added fat. Any fruits that cause symptoms. For some people this may include citrus fruits, such as oranges, grapefruit, pineapple, and lemons.  Vegetables Deep-fried vegetables. [place] fries. Any vegetables prepared with added fat. Any vegetables that cause symptoms. For some people, this may include tomatoes and tomato products, chili peppers, onions and garlic, and horseradish. Grains Pastries or quick breads with added fat. Meats and other proteins High-fat meats, such as fatty beef or pork, hot dogs, ribs, ham, sausage, salami, and bacon. Fried meat or protein, including fried fish and fried chicken. Nuts and nut butters, in large amounts. Dairy Whole milk and chocolate milk. Sour cream. Cream. Ice cream. Cream cheese. Milkshakes. Fats and oils Butter. Margarine. Shortening. Ghee. Beverages Coffee and tea, with or without caffeine. Carbonated beverages. Sodas. Energy drinks. Fruit juice made with acidic fruits, such as orange or grapefruit. Tomato juice. Alcoholic drinks. Sweets and desserts Chocolate and cocoa. Donuts. Seasonings and condiments Pepper. Peppermint and spearmint. Added salt. Any condiments, herbs, or seasonings that cause symptoms. For some people, this may include curry, hot sauce, or vinegar-based salad dressings. The items listed above  may not be a complete list of foods and beverages to avoid. Contact a dietitian for more information. Questions to ask your health care provider Diet and lifestyle changes are usually the first steps that are taken to manage symptoms of GERD. If diet and lifestyle changes do not improve your symptoms, talk with your health care provider about taking medicines. Where to find more information  International Foundation for Gastrointestinal Disorders: aboutgerd.org Summary  When you have gastroesophageal reflux disease (GERD), food and lifestyle choices may be very helpful in easing the discomfort of GERD.  Eat frequent, small meals instead of three large meals each day. Eat your meals slowly, in a relaxed setting. Avoid bending over or lying down until 2-3 hours after eating.  Limit high-fat foods such as fatty meats or fried foods. This information is not intended to replace advice given to you by your health care provider. Make sure you discuss any questions you have with your health care provider. Document Revised: 05/28/2020 Document Reviewed: 05/28/2020 Elsevier Patient Education  2021 [place].  Umbilical Hernia, Adult  A hernia is a bulge of tissue that pushes through an opening between muscles. An umbilical hernia happens in the abdomen, near the belly button (umbilicus). The hernia may contain tissues from the small intestine, large intestine, or fatty tissue covering the intestines (omentum). Umbilical hernias in adults tend to get worse over time, and they require surgical treatment. There are several types of umbilical hernias. You may have:  A hernia located just above or below the umbilicus (indirect hernia). This is the most common type of umbilical hernia in adults.  A hernia that forms through an opening formed by the umbilicus (direct hernia).  A hernia that comes and goes (reducible hernia). A reducible hernia may be visible only when you strain, lift something heavy,  or cough. This type of hernia can be pushed back into the abdomen (reduced).  A hernia that traps abdominal tissue inside the hernia (incarcerated hernia). This type of hernia cannot be reduced.  A hernia that cuts off blood flow to the tissues inside the hernia (strangulated hernia). The tissues can start to die if this happens. This type of hernia requires emergency treatment. What are the causes? An umbilical hernia happens when tissue inside the abdomen presses on a weak area of the abdominal muscles. What increases the risk? You may have a greater risk of this condition if you:  Are obese.  Have had several pregnancies.  Have a buildup of fluid inside  your abdomen (ascites).  Have had surgery that weakens the abdominal muscles. What are the signs or symptoms? The main symptom of this condition is a painless bulge at or near the belly button. A reducible hernia may be visible only when you strain, lift something heavy, or cough. Other symptoms may include:  Dull pain.  A feeling of pressure. Symptoms of a strangulated hernia may include:  Pain that gets increasingly worse.  Nausea and vomiting.  Pain when pressing on the hernia.  Skin over the hernia becoming red or purple.  Constipation.  Blood in the stool. How is this diagnosed? This condition may be diagnosed based on:  A physical exam. You may be asked to cough or strain while standing. These actions increase the pressure inside your abdomen and force the hernia through the opening in your muscles. Your health care provider may try to reduce the hernia by pressing on it.  Your symptoms and medical history. How is this treated? Surgery is the only treatment for an umbilical hernia. Surgery for a strangulated hernia is done as soon as possible. If you have a small hernia that is not incarcerated, you may need to lose weight before having surgery. Follow these instructions at home:  Lose weight, if told by your  health care provider.  Do not try to push the hernia back in.  Watch your hernia for any changes in color or size. Tell your health care provider if any changes occur.  You may need to avoid activities that increase pressure on your hernia.  Do not lift anything that is heavier than 10 lb (4.5 kg) until your health care provider says that this is safe.  Take over-the-counter and prescription medicines only as told by your health care provider.  Keep all follow-up visits as told by your health care provider. This is important. Contact a health care provider if:  Your hernia gets larger.  Your hernia becomes painful. Get help right away if:  You develop sudden, severe pain near the area of your hernia.  You have pain as well as nausea or vomiting.  You have pain and the skin over your hernia changes color.  You develop a fever. This information is not intended to replace advice given to you by your health care provider. Make sure you discuss any questions you have with your health care provider. Document Revised: 12/30/2017 Document Reviewed: 05/18/2017 Elsevier Patient Education  [place].

## 2021-01-31 NOTE — Progress Notes (Signed)
Chief Complaint  Patient presents with  . Mass    Near the navel    F/u  1. Feels mass near [employer] in the shower ? 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  HENT: Negative for hearing loss.   Respiratory: Negative for shortness of breath.   Cardiovascular: Negative for chest pain.  Gastrointestinal: Positive for abdominal pain.  Psychiatric/Behavioral: Negative for depression.   Past Medical History:  Diagnosis Date  . Allergic rhinitis   . Anxiety   . AVM (arteriovenous malformation) brain    MRI'S SHOW CHRONIC HEMORRHAGE IN CEREBELLUM  . 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 Elevated cholesterol   . GERD (gastroesophageal reflux disease)   . History of blood transfusion   . Hypothyroidism   . Low blood pressure   . Pre-diabetes   . Urinary incontinence    After delivery of her recent baby   Past Surgical History:  Procedure Laterality Date  . BICEPT TENODESIS  07/28/2019   Procedure: MINI OPEN BICEPS TENODESIS;  Surgeon: Corky Mull, MD;  Location: [facility] ORS;  Service: Orthopedics;;  . BLADDER SURGERY     as a child  . COLONOSCOPY WITH PROPOFOL N/A 02/01/2020   Procedure: COLONOSCOPY WITH PROPOFOL;  Surgeon: Lin Landsman, MD;  Location: [facility] ENDOSCOPY;  Service: Gastroenterology;  Laterality: N/A;  . ESOPHAGOGASTRODUODENOSCOPY (EGD) WITH PROPOFOL N/A 02/01/2020   Procedure: ESOPHAGOGASTRODUODENOSCOPY (EGD) WITH PROPOFOL;  Surgeon: Lin Landsman, MD;  Location: [facility] ENDOSCOPY;  Service: Gastroenterology;  Laterality: N/A;  . IR ANGIO INTRA EXTRACRAN SEL COM CAROTID INNOMINATE BILAT MOD SED  08/12/2019  . IR ANGIO VERTEBRAL SEL VERTEBRAL BILAT MOD SED  08/12/2019  . IR ANGIOGRAM EXTREMITY LEFT  08/12/2019  . IR US GUIDE  VASC ACCESS RIGHT  08/12/2019  . MOUTH SURGERY    . NASAL SEPTUM SURGERY    . REFRACTIVE SURGERY    . SHOULDER ARTHROSCOPY WITH SUBACROMIAL DECOMPRESSION, ROTATOR CUFF REPAIR AND BICEP TENDON REPAIR Left 07/28/2019   Procedure: SHOULDER ARTHROSCOPY WITH, DEBIRDEMENT, SUBACROMIAL DECOMPRESSION, MINI OPEN ROTATOR CUFF REPAIR;  Surgeon: Corky Mull, MD;  Location: [facility] ORS;  Service: Orthopedics;  Laterality: Left;  . TONSILLECTOMY     Family History  Problem Relation Age of Onset  . Arthritis Other        Parent, Grandparent  . Prostate cancer Other        Grandparent  . Hyperlipidemia Other        Parent  . Heart disease Other        Grandparent  . Stroke Other        Grandparent  . Diabetes Other        Parent   Social History   Socioeconomic History  . Marital status: Married    Spouse name: Not on file  . Number of children: Not on file  . Years of education: Not on file  . Highest education level: Not on file  Occupational History  . Not on file  Tobacco Use  . Smoking status: Never Smoker  . Smokeless tobacco: Never Used  Vaping Use  . Vaping Use: Never used  Substance and Sexual Activity  . Alcohol use: Yes    Alcohol/week: 0.0  standard drinks    Comment: rare  . Drug use: No  . Sexual activity: Not on file  Other Topics Concern  . Not on file  Social History Narrative  . Not on file   Social Determinants of Health   Financial Resource Strain: Not on file  Food Insecurity: Not on file  Transportation Needs: Not on file  Physical Activity: Not on file  Stress: Not on file  Social Connections: Not on file  Intimate Partner Violence: Not on file   Current Meds  Medication Sig  . azelastine (ASTELIN) 0.1 % nasal spray PLACE 2 SPRAYS INTO BOTH NOSTRILS 2 (TWO) TIMES DAILY. USE IN EACH NOSTRIL AS DIRECTED  . Cholecalciferol (VITAMIN D) 50 MCG (2000 UT) tablet Take 2,000 Units by mouth daily at 3 pm.  . famotidine (PEPCID) 20 MG tablet Take 20 mg by mouth  2 (two) times daily.  Marland Kitchen levothyroxine (SYNTHROID) 150 MCG tablet TAKE 1 TABLET (150 MCG TOTAL) BY MOUTH DAILY BEFORE BREAKFAST.  Marland Kitchen Probiotic CAPS    Allergies  Allergen Reactions  . Sulfa Antibiotics Nausea And Vomiting  . Lobster [Shellfish Allergy] Nausea And Vomiting  . Other Nausea And Vomiting   No results found for this or any previous visit (from the past 2160 hour(s)). Objective  Body mass index is 38.03 kg/m. Wt Readings from Last 3 Encounters:  01/31/21 235 lb 9.6 oz (106.9 kg)  05/23/20 223 lb 12.8 oz (101.5 kg)  03/26/20 226 lb 3.2 oz (102.6 kg)   Temp Readings from Last 3 Encounters:  01/31/21 97.8 F (36.6 C) (Oral)  05/23/20 (!) 96.5 F (35.8 C) (Temporal)  03/26/20 98.2 F (36.8 C) (Oral)   BP Readings from Last 3 Encounters:  01/31/21 106/66  05/23/20 120/80  03/26/20 111/61   Pulse Readings from Last 3 Encounters:  01/31/21 (!) 57  05/23/20 72  03/26/20 66    Physical Exam Vitals and nursing note reviewed.  Constitutional:      Appearance: Normal appearance. She is well-developed and well-groomed. She is obese.  HENT:     Head: Normocephalic and atraumatic.  Eyes:     Conjunctiva/sclera: Conjunctivae normal.     Pupils: Pupils are equal, round, and reactive to light.  Cardiovascular:     Rate and Rhythm: Normal rate and regular rhythm.     Heart sounds: Normal heart sounds. No murmur heard.   Pulmonary:     Effort: Pulmonary effort is normal.     Breath sounds: Normal breath sounds.  Abdominal:     Hernia: A hernia is present.    Skin:    General: Skin is warm and dry.  Neurological:     General: No focal deficit present.     Mental Status: She is alert and oriented to person, place, and time. Mental status is at baseline.     Gait: Gait normal.  Psychiatric:        Attention and Perception: Attention and perception normal.        Mood and Affect: Mood and affect normal.        Speech: Speech normal.        Behavior: Behavior  normal. Behavior is cooperative.        Thought Content: Thought content normal.        Cognition and Memory: Cognition and memory normal.        Judgment: Judgment normal.     Assessment  Plan  Periumbilical mass r/o hernia vs other - Plan: CT  Abdomen Pelvis Wo Contrast  Umbilical hernia without obstruction and without gangrene - Plan: CT Abdomen Pelvis Wo Contrast  HM mammo due 09/10/21  Fasting labs 05/23/21 f/u with PCP Declines hep C/HIV  Pap reviewed 06/29/20 neg pap neg HPV  Provider: Dr. Olivia Mackie McLean-Scocuzza-Internal Medicine

## 2021-02-01 ENCOUNTER — Ambulatory Visit: Payer: BC Managed Care – PPO | Attending: Family Medicine | Admitting: Physical Therapy

## 2021-02-01 DIAGNOSIS — R2689 Other abnormalities of gait and mobility: Secondary | ICD-10-CM | POA: Insufficient documentation

## 2021-02-01 DIAGNOSIS — M2170 Unequal limb length (acquired), unspecified site: Secondary | ICD-10-CM | POA: Insufficient documentation

## 2021-02-01 DIAGNOSIS — M533 Sacrococcygeal disorders, not elsewhere classified: Secondary | ICD-10-CM | POA: Diagnosis not present

## 2021-02-01 NOTE — Patient Instructions (Signed)
   Side of hip stretch:  Reclined twist for hips and side of the hips/ legs  Lay on your back, knees bend Scoot hips to the R , leave shoulders in place Rock knees to the L side resting onto pillows to keep leg at the same width of hips Pillow under L thigh to minimize too much strain   __   Deep core level 1 and 2 ( once a day )

## 2021-02-01 NOTE — Therapy (Signed)
[place] MAIN [facility] SERVICES [address] [place], [place], [address] Phone: [phone]   Fax:  [phone]  Physical Therapy Treatment  Patient Details  Name: MEAGHEN VECCHIARELLI MRN: [id number] Date of Birth: [birth year] Referring Provider (PT): Biagio Quint, MD   Encounter Date: 02/01/2021   PT End of Session - 02/01/21 1015    Visit Number 3    Number of Visits 10    Date for PT Re-Evaluation 03/13/21    PT Start Time 0904    PT Stop Time 1000    PT Time Calculation (min) 56 min           Past Medical History:  Diagnosis Date  . Allergic rhinitis   . Anxiety   . AVM (arteriovenous malformation) brain    MRI'S SHOW CHRONIC HEMORRHAGE IN CEREBELLUM  . 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 Elevated cholesterol   . GERD (gastroesophageal reflux disease)   . History of blood transfusion   . Hypothyroidism   . Low blood pressure   . Pre-diabetes   . Urinary incontinence    After delivery of her recent baby    Past Surgical History:  Procedure Laterality Date  . BICEPT TENODESIS  07/28/2019   Procedure: MINI OPEN BICEPS TENODESIS;  Surgeon: Corky Mull, MD;  Location: [facility] ORS;  Service: Orthopedics;;  . BLADDER SURGERY     as a child  . COLONOSCOPY WITH PROPOFOL N/A 02/01/2020   Procedure: COLONOSCOPY WITH PROPOFOL;  Surgeon: Lin Landsman, MD;  Location: [facility] END[facility]COPY;  Service: Gastroenterology;  Laterality: N/A;  . ESOPHAGOGASTRODUODENOSCOPY (EGD) WITH PROPOFOL N/A 02/01/2020   Procedure: ESOPHAGOGASTRODUODENOSCOPY (EGD) WITH PROPOFOL;  Surgeon: Lin Landsman, MD;  Location: [facility] ENDOSCOPY;  Service: Gastroenterology;  Laterality: N/A;  . IR ANGIO INTRA EXTRACRAN SEL COM CAROTID INNOMINATE BILAT MOD SED  08/12/2019  . IR ANGIO VERTEBRAL SEL VERTEBRAL BILAT MOD SED  08/12/2019  . IR ANGIOGRAM EXTREMITY LEFT  08/12/2019  . IR US GUIDE VASC ACCESS RIGHT   08/12/2019  . MOUTH SURGERY    . NASAL SEPTUM SURGERY    . REFRACTIVE SURGERY    . SHOULDER ARTHROSCOPY WITH SUBACROMIAL DECOMPRESSION, ROTATOR CUFF REPAIR AND BICEP TENDON REPAIR Left 07/28/2019   Procedure: SHOULDER ARTHROSCOPY WITH, DEBIRDEMENT, SUBACROMIAL DECOMPRESSION, MINI OPEN ROTATOR CUFF REPAIR;  Surgeon: Corky Mull, MD;  Location: [facility] ORS;  Service: Orthopedics;  Laterality: Left;  . TONSILLECTOMY      There were no vitals filed for this visit.   Subjective Assessment - 02/01/21 1008    Subjective Pt has been practiced walking on the ballmound of her feet. Pt will under [place] for her feet as surgery is not an option. Pt also felt a lump in her L groin and it was a little bit sore and she saw her MD about it. Pt is getting scheduled for a CT scan.              [facility] PT Assessment - 02/01/21 1054      Coordination   Coordination and Movement Description chest breathing,      Palpation   SI assessment  R hamstring/ glut med and max  tightness      Ambulation/Gait   Gait Comments more mobility at feet, tightness at SIJ , knees adducted  [place] Adult PT Treatment/Exercise - 02/01/21 1054      Neuro Re-ed    Neuro Re-ed Details  cued for deep core level 1 and 2, cued for stretch for R SIJ      Moist Heat Therapy   Number Minutes Moist Heat 5 Minutes    Moist Heat Location --   sacrum     Manual Therapy   Manual therapy comments long axis distraction/ STM/MWM hamstring. 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    Time 8    Period Weeks    Status On-going      PT LONG TERM GOAL #4   Title Pt will report notneeding a urinary pad across one week  in order to practice improvde hygiene and have pelvic floor control    Time 6    Period Weeks    Status On-going      PT LONG TERM GOAL #5   Title Pt will demo equal pelvic girdle and shoulder height across 2 weeks with shoe lift in R shoe in order to minimzie foot pain and B hip pain and progress to deep core exercises for urinary Sx    Time 2    Status Achieved      PT LONG TERM GOAL #6   Title Pt will demo increased L  DF AROM ([place]) from 10 deg to > 20 deg in order to promote improved gait, mobility in ankle. foot to minimize  plantar fasciitis and to stand for longer periods of time at work    Time 6    Period Weeks    Status On-going      PT LONG TERM GOAL #7   Title Pt will demo proper deep core coordination and progress with pelvic floor contractions 3 sec, 3 reps in order to minimize leakage    Time 8    Period Weeks    Status On-going                 Plan - 02/01/21 1113    Clinical Impression Statement Pt required cues for deep core coordination with less chest breathing and more stability with feet. Pt required manual Tx to minimize L glut/ hamstring tightness which promoted better gait mechanics. R foot mobility has improved. Plan to assess pelvic floor at next session. Pt continues to benefit from skilled PT    Examination-Activity Limitations Continence;Toileting;Stand    Stability/Clinical Decision Making Evolving/Moderate complexity    Rehab Potential Good  PT Frequency 1x / week    PT Duration Other (comment)   10   PT Treatment/Interventions Moist Heat;Stair training;Neuromuscular re-education;Therapeutic  activities;Therapeutic exercise;Functional mobility training;Patient/family education;Manual techniques;Balance training;Energy conservation;Splinting;Manual lymph drainage;Taping;Scar mobilization;Joint Manipulations;Spinal Manipulations;Gait training;Cryotherapy;Dry needling    Consulted and Agree with Plan of Care Patient           Patient will benefit from skilled therapeutic intervention in order to improve the following deficits and impairments:  Increased muscle spasms,Decreased mobility,Decreased coordination,Abnormal gait,Improper body mechanics,Pain,Postural dysfunction,Decreased strength,Decreased range of motion,Decreased endurance,Decreased balance,Difficulty walking,Hypomobility,Increased fascial restricitons,Decreased scar mobility  Visit Diagnosis: Unequal leg length  Sacrococcygeal disorders, not elsewhere classified  Other abnormalities of gait and mobility     Problem List Patient Active Problem List   Diagnosis Date Noted  . Stress incontinence 12/03/2020  . Altered bowel habits   . Tendinitis of upper biceps tendon of left shoulder 07/28/2019  . Anxiety and depression 07/03/2019  . Obstructive sleep apnea 07/03/2019  . Subjective pulsatile tinnitus of right ear 07/03/2019  . Nontraumatic incomplete tear of left rotator cuff 05/23/2019  . Rotator cuff tendinitis, left 05/23/2019  . Stress 12/22/2018  . Loose stools 12/22/2018  . Clinically isolated syndrome of brainstem ([place]) 12/22/2018  . Rotator cuff impingement syndrome of left shoulder 06/21/2018  . Prediabetes 06/23/2017  . Carpal tunnel syndrome 06/23/2017  . Vitamin D deficiency 06/23/2017  . Recurrent sinusitis 12/24/2016  . Multiple benign nevi 12/24/2016  . Allergic rhinitis 06/19/2016  . Obesity 06/19/2016  . Hypothyroidism 11/20/2015  . GERD (gastroesophageal reflux disease) 11/20/2015    Jerl Mina ,PT, DPT, E-RYT  02/01/2021, 11:14 AM  [place]  MAIN [facility] SERVICES [address] [place], [place], [address] Phone: [phone]   Fax:  [phone]  Name: AUBRY RANKIN MRN: [id number] Date of Birth: 1971-05-01

## 2021-02-04 ENCOUNTER — Ambulatory Visit: Payer: BC Managed Care – PPO | Admitting: Physical Therapy

## 2021-02-04 ENCOUNTER — Other Ambulatory Visit: Payer: Self-pay

## 2021-02-04 DIAGNOSIS — M2170 Unequal limb length (acquired), unspecified site: Secondary | ICD-10-CM | POA: Diagnosis not present

## 2021-02-04 DIAGNOSIS — M533 Sacrococcygeal disorders, not elsewhere classified: Secondary | ICD-10-CM | POA: Diagnosis not present

## 2021-02-04 DIAGNOSIS — R2689 Other abnormalities of gait and mobility: Secondary | ICD-10-CM

## 2021-02-04 NOTE — Patient Instructions (Addendum)
Stretch for pelvic floor   V- slides  "v heels slide away and then back toward buttocks and then rock knee to slight ,  slide heel along at 11 o clock away from buttocks   10 reps     On belly: Riding horse edge of mattress  knee bent like riding a horse, move knee towards armpit and out  10 reps   Mermaid stretch  Rocking while seated on the floor with heels to one side of the hip Heels to one side of the hip  Rock forward towards the knee that is bent , rock beck towards the opposite sitting bones   Childs pose  Over pillows  childs poses rocking with pillow under belly if need more support/ relaxation  childs poses rocking   Toes tucked, shoulders down and back, on forearms , hands shoulder width apart  10 reps    __  L heel raises with hand on counter 20 reps

## 2021-02-04 NOTE — Therapy (Signed)
[place] MAIN [facility] SERVICES [address] [place], [place], [address] Phone: [phone]   Fax:  [phone]  Physical Therapy Treatment  Patient Details  Name: Stephanie Shields MRN: [id number] Date of Birth: 05-20-1971 Referring Provider (PT): Biagio Quint, MD   Encounter Date: 02/04/2021   PT End of Session - 02/04/21 1009    Visit Number 4    Number of Visits 10    Date for PT Re-Evaluation 03/13/21    PT Start Time 1005    PT Stop Time 1057    PT Time Calculation (min) 52 min           Past Medical History:  Diagnosis Date  . Allergic rhinitis   . Anxiety   . AVM (arteriovenous malformation) brain    MRI'S SHOW CHRONIC HEMORRHAGE IN CEREBELLUM  . 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 Elevated cholesterol   . GERD (gastroesophageal reflux disease)   . History of blood transfusion   . Hypothyroidism   . Low blood pressure   . Pre-diabetes   . Urinary incontinence    After delivery of her recent baby    Past Surgical History:  Procedure Laterality Date  . BICEPT TENODESIS  07/28/2019   Procedure: MINI OPEN BICEPS TENODESIS;  Surgeon: Corky Mull, MD;  Location: [facility] ORS;  Service: Orthopedics;;  . BLADDER SURGERY     as a child  . COLONOSCOPY WITH PROPOFOL N/A 02/01/2020   Procedure: COLONOSCOPY WITH PROPOFOL;  Surgeon: Lin Landsman, MD;  Location: [facility] ENDOSCOPY;  Service: Gastroenterology;  Laterality: N/A;  . ESOPHAGOGASTRODUODENOSCOPY (EGD) WITH PROPOFOL N/A 02/01/2020   Procedure: ESOPHAGOGASTRODUODENOSCOPY (EGD) WITH PROPOFOL;  Surgeon: Lin Landsman, MD;  Location: [facility] ENDOSCOPY;  Service: Gastroenterology;  Laterality: N/A;  . IR ANGIO INTRA EXTRACRAN SEL COM CAROTID INNOMINATE BILAT MOD SED  08/12/2019  . IR ANGIO VERTEBRAL SEL VERTEBRAL BILAT MOD SED  08/12/2019  . IR ANGIOGRAM EXTREMITY LEFT  08/12/2019  . IR US GUIDE VASC ACCESS RIGHT   08/12/2019  . MOUTH SURGERY    . NASAL SEPTUM SURGERY    . REFRACTIVE SURGERY    . SHOULDER ARTHROSCOPY WITH SUBACROMIAL DECOMPRESSION, ROTATOR CUFF REPAIR AND BICEP TENDON REPAIR Left 07/28/2019   Procedure: SHOULDER ARTHROSCOPY WITH, DEBIRDEMENT, SUBACROMIAL DECOMPRESSION, MINI OPEN ROTATOR CUFF REPAIR;  Surgeon: Corky Mull, MD;  Location: [facility] ORS;  Service: Orthopedics;  Laterality: Left;  . TONSILLECTOMY      There were no vitals filed for this visit.   Subjective Assessment - 02/04/21 1059    Subjective Pt reported she has practiced the breathing exercise              [facility] PT Assessment - 02/04/21 1054      Coordination   Gross Motor Movements are Fluid and Coordinated --   no ab overuse with breathing     Strength   Overall Strength Comments without hands on wall, R 35 rep, L 25 reps                      Pelvic Floor Special Questions - 02/04/21 1047    External Perineal Exam noted proper lengthening of pelvic floor    Pelvic Floor Internal Exam pt consented and had no contraindications    Exam Type Vaginal    Sensation --    Palpation tightness at ATLA B, tenderness 5 and 7 o'clock  [place] Adult PT Treatment/Exercise - 02/04/21 1043      Neuro Re-ed    Neuro Re-ed Details  cued for pelvic floor stretches and feet propioception      Moist Heat Therapy   Number Minutes Moist Heat 5 Minutes    Moist Heat Location --   perineum through sheets, childs pose on bolster to lengthen pelvic floor     Manual Therapy   Internal Pelvic Floor STM/MWM to address tightness at ATLA B, tenderness 5 and 7 o'clock                       PT Long Term Goals - 02/01/21 1016      PT LONG TERM GOAL #1   Title Pt will improve her FOTO score for foot pain from 42 pts to > 53 pts in order walk and play with kids    Time 8    Period Weeks    Status On-going      PT LONG TERM GOAL #2   Title Pt will improve FOTO for urinary problem score  from 48 pts to > 58 pts in order to improve pelvic function and QOL    Time 10    Period Weeks    Status On-going      PT LONG TERM GOAL #3   Title Pt will report noticing less L foot 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    Time 8    Period Weeks    Status On-going      PT LONG TERM GOAL #4   Title Pt will report notneeding a urinary pad across one week  in order to practice improvde hygiene and have pelvic floor control    Time 6    Period Weeks    Status On-going      PT LONG TERM GOAL #5   Title Pt will demo equal pelvic girdle and shoulder height across 2 weeks with shoe lift in R shoe in order to minimzie foot pain and B hip pain and progress to deep core exercises for urinary Sx    Time 2    Status Achieved      PT LONG TERM GOAL #6   Title Pt will demo increased L  DF AROM ([place]) from 10 deg to > 20 deg in order to promote improved gait, mobility in ankle. foot to minimize  plantar fasciitis and to stand for longer periods of time at work    Time 6    Period Weeks    Status On-going      PT LONG TERM GOAL #7   Title Pt will demo proper deep core coordination and progress with pelvic floor contractions 3 sec, 3 reps in order to minimize leakage    Time 8    Period Weeks    Status On-going                 Plan - 02/04/21 1010    Clinical Impression Statement Pt demo'd increased posterior pelvic floor mm tightness with perineal scar restrictions which decreased post Tx. Focusing on lengthening pelvic floor and withholding kegels until pt demo no more tightness.  Initiated L heel raises into HEP and provided cues for less hyperextensions of knees in gait. Pt demo'd good carry over with coordination of breathing and pelvic floor with no cues required. Pt continues  to benefit from skilled PT    Examination-Activity Limitations Continence;Toileting;Stand    Stability/Clinical  Decision Making Evolving/Moderate complexity    Rehab Potential Good    PT Frequency 1x / week    PT Duration Other (comment)   10   PT Treatment/Interventions Moist Heat;Stair training;Neuromuscular re-education;Therapeutic activities;Therapeutic exercise;Functional mobility training;Patient/family education;Manual techniques;Balance training;Energy conservation;Splinting;Manual lymph drainage;Taping;Scar mobilization;Joint Manipulations;Spinal Manipulations;Gait training;Cryotherapy;Dry needling    Consulted and Agree with Plan of Care Patient           Patient will benefit from skilled therapeutic intervention in order to improve the following deficits and impairments:  Increased muscle spasms,Decreased mobility,Decreased coordination,Abnormal gait,Improper body mechanics,Pain,Postural dysfunction,Decreased strength,Decreased range of motion,Decreased endurance,Decreased balance,Difficulty walking,Hypomobility,Increased fascial restricitons,Decreased scar mobility  Visit Diagnosis: Sacrococcygeal disorders, not elsewhere classified  Unequal leg length  Other abnormalities of gait and mobility     Problem List Patient Active Problem List   Diagnosis Date Noted  . Stress incontinence 12/03/2020  . Altered bowel habits   . Tendinitis of upper biceps tendon of left shoulder 07/28/2019  . Anxiety and depression 07/03/2019  . Obstructive sleep apnea 07/03/2019  . Subjective pulsatile tinnitus of right ear 07/03/2019  . Nontraumatic incomplete tear of left rotator cuff 05/23/2019  . Rotator cuff tendinitis, left 05/23/2019  . Stress 12/22/2018  . Loose stools 12/22/2018  . Clinically isolated syndrome of brainstem ([place]) 12/22/2018  . Rotator cuff impingement syndrome of left shoulder 06/21/2018  . Prediabetes 06/23/2017  . Carpal tunnel syndrome 06/23/2017  . Vitamin D deficiency 06/23/2017  . Recurrent sinusitis 12/24/2016  . Multiple benign nevi 12/24/2016  . Allergic  rhinitis 06/19/2016  . Obesity 06/19/2016  . Hypothyroidism 11/20/2015  . GERD (gastroesophageal reflux disease) 11/20/2015    Jerl Mina ,PT, DPT, E-RYT  02/04/2021, 11:00 AM  [place] MAIN [facility] SERVICES [address] [place], [place], [address] Phone: [phone]   Fax:  [phone]  Name: Stephanie Shields MRN: [id number] Date of Birth: 02/17/1971

## 2021-02-05 ENCOUNTER — Telehealth: Payer: Self-pay | Admitting: Family Medicine

## 2021-02-05 NOTE — Telephone Encounter (Signed)
lft vm for pt to call ofc to sch CT abdomen pelvis. Thanks 

## 2021-02-13 ENCOUNTER — Ambulatory Visit: Payer: BC Managed Care – PPO | Admitting: Physical Therapy

## 2021-02-13 ENCOUNTER — Other Ambulatory Visit: Payer: Self-pay

## 2021-02-13 DIAGNOSIS — M533 Sacrococcygeal disorders, not elsewhere classified: Secondary | ICD-10-CM

## 2021-02-13 DIAGNOSIS — R2689 Other abnormalities of gait and mobility: Secondary | ICD-10-CM | POA: Diagnosis not present

## 2021-02-13 DIAGNOSIS — M2170 Unequal limb length (acquired), unspecified site: Secondary | ICD-10-CM

## 2021-02-13 NOTE — Patient Instructions (Signed)
Deep core with pillow under hips and focus :   Practice proper pelvic floor coordination  Inhale: expand pelvic floor muscles Exhale" "j" scoop, allow pelvic floor to close, lift first before belly sinks   ( not "draw abdominal muscle to spine" or strain with abdominal muscles")   __  Feet care :  Self -feet massage   Handshake : fingers between toes, moving ballmounds/toes back and forth several times while other hand anchors at arch. Do the same at the hind/mid foot.  Heel to toes upward to a letter Big Letter T strokes to spread ballmounds and toes, several times, pinch between webs of toes  Run finger tips along top of foot between long bones "comb between the bones"    Wiggle toes and spread them out when relaxing   __ Remove orthotics to stop walking on outer edges of feet    [email or web address]

## 2021-02-13 NOTE — Therapy (Addendum)
[place] MAIN [facility] SERVICES [address] [place], [place], [address] Phone: [phone]   Fax:  [phone]  Physical Therapy Treatment  Patient Details  Name: Stephanie Shields MRN: [id number] Date of Birth: March 01, 1971 Referring Provider (PT): Biagio Quint, MD   Encounter Date: 02/13/2021   PT End of Session - 02/13/21 1202    Visit Number 5    Number of Visits 10    Date for PT Re-Evaluation 03/13/21    PT Start Time 1100    PT Stop Time 1200    PT Time Calculation (min) 60 min           Past Medical History:  Diagnosis Date  . Allergic rhinitis   . Anxiety   . AVM (arteriovenous malformation) brain    MRI'S SHOW CHRONIC HEMORRHAGE IN CEREBELLUM  . 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 Elevated cholesterol   . GERD (gastroesophageal reflux disease)   . History of blood transfusion   . Hypothyroidism   . Low blood pressure   . Pre-diabetes   . Urinary incontinence    After delivery of her recent baby    Past Surgical History:  Procedure Laterality Date  . BICEPT TENODESIS  07/28/2019   Procedure: MINI OPEN BICEPS TENODESIS;  Surgeon: Corky Mull, MD;  Location: [facility] ORS;  Service: Orthopedics;;  . BLADDER SURGERY     as a child  . COLONOSCOPY WITH PROPOFOL N/A 02/01/2020   Procedure: COLONOSCOPY WITH PROPOFOL;  Surgeon: Lin Landsman, MD;  Location: [facility] ENDOSCOPY;  Service: Gastroenterology;  Laterality: N/A;  . ESOPHAGOGASTRODUODENOSCOPY (EGD) WITH PROPOFOL N/A 02/01/2020   Procedure: ESOPHAGOGASTRODUODENOSCOPY (EGD) WITH PROPOFOL;  Surgeon: Lin Landsman, MD;  Location: [facility] ENDOSCOPY;  Service: Gastroenterology;  Laterality: N/A;  . IR ANGIO INTRA EXTRACRAN SEL COM CAROTID INNOMINATE BILAT MOD SED  08/12/2019  . IR ANGIO VERTEBRAL SEL VERTEBRAL BILAT MOD SED  08/12/2019  . IR ANGIOGRAM EXTREMITY LEFT  08/12/2019  . IR US GUIDE VASC ACCESS RIGHT   08/12/2019  . MOUTH SURGERY    . NASAL SEPTUM SURGERY    . REFRACTIVE SURGERY    . SHOULDER ARTHROSCOPY WITH SUBACROMIAL DECOMPRESSION, ROTATOR CUFF REPAIR AND BICEP TENDON REPAIR Left 07/28/2019   Procedure: SHOULDER ARTHROSCOPY WITH, DEBIRDEMENT, SUBACROMIAL DECOMPRESSION, MINI OPEN ROTATOR CUFF REPAIR;  Surgeon: Corky Mull, MD;  Location: [facility] ORS;  Service: Orthopedics;  Laterality: Left;  . TONSILLECTOMY      There were no vitals filed for this visit.   Subjective Assessment - 02/13/21 1108    Subjective Pt is feeling her pelvic floor move more with deep core exercises              [facility] PT Assessment - 02/13/21 1157      Palpation   Palpation comment tigthness at L foot, midfoot                      Pelvic Floor Special Questions - 02/13/21 1139    External Perineal Exam noted proper lengthening of pelvic floor    Prolapse Anterior Wall   behind pubic symphysis, improved contraction w. hips elevated on pillow   Pelvic Floor Internal Exam pt consented and had no contraindications    Exam Type Vaginal    Palpation tightness at at perineal scar 6, 9 clock, externally: tightness at pubic sym attachments R > L     Strength  weak squeeze, no lift             [facility] Adult PT Treatment/Exercise - 02/13/21 1157      Neuro Re-ed    Neuro Re-ed Details  cued for sequential activation of pelvic floor , feet massage      Moist Heat Therapy   Moist Heat Location --   perineum     Manual Therapy   Manual therapy comments fascial mobility at L midfoot    Internal Pelvic Floor STM/MWM to decreased perineal restrictions and promote upward mobility of pelvic floor , pubic sym attachments                      PT Long Term Goals - 02/01/21 1016      PT LONG TERM GOAL #1   Title Pt will improve her FOTO score for foot pain from 42 pts to > 53 pts in order walk and play with kids    Time 8    Period Weeks    Status On-going      PT LONG TERM GOAL #2    Title Pt will improve FOTO for urinary problem score from 48 pts to > 58 pts in order to improve pelvic function and QOL    Time 10    Period Weeks    Status On-going      PT LONG TERM GOAL #3   Title Pt will report noticing less L foot 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    Time 8    Period Weeks    Status On-going      PT LONG TERM GOAL #4   Title Pt will report notneeding a urinary pad across one week  in order to practice improvde hygiene and have pelvic floor control    Time 6    Period Weeks    Status On-going      PT LONG TERM GOAL #5   Title Pt will demo equal pelvic girdle and shoulder height across 2 weeks with shoe lift in R shoe in order to minimzie foot pain and B hip pain and progress to deep core exercises for urinary Sx    Time 2    Status Achieved      PT LONG TERM GOAL #6   Title Pt will demo increased L  DF AROM ([facility]) from 10 deg to > 20 deg in order to promote improved gait, mobility in ankle. foot to minimize  plantar fasciitis and to stand for longer periods of time at work    Time 6    Period Weeks    Status On-going      PT LONG TERM GOAL #7   Title Pt will demo proper deep core coordination and progress with pelvic floor contractions 3 sec, 3 reps in order to minimize leakage    Time 8    Period Weeks    Status On-going                 Plan - 02/13/21 1202    Clinical Impression Statement Pt continue to require internal pelvic floor mm releases to achieve a more proper contraction. Pt requried manual Tx to increase L foot mobility. Advised pt to remove orthotics from shoe to minimize supination and stiffness in feet. Addressing lower kinetic chain will continue to help minimize overactivity iof pelvic floor mm.  Pt continues to benefit from skilled PT.    Examination-Activity Limitations Continence;Toileting;Stand    Stability/Clinical Decision Making  Evolving/Moderate complexity    Rehab Potential Good    PT Frequency 1x / week    PT Duration Other (comment)   10   PT Treatment/Interventions Moist Heat;Stair training;Neuromuscular re-education;Therapeutic activities;Therapeutic exercise;Functional mobility training;Patient/family education;Manual techniques;Balance training;Energy conservation;Splinting;Manual lymph drainage;Taping;Scar mobilization;Joint Manipulations;Spinal Manipulations;Gait training;Cryotherapy;Dry needling    Consulted and Agree with Plan of Care Patient           Patient will benefit from skilled therapeutic intervention in order to improve the following deficits and impairments:  Increased muscle spasms,Decreased mobility,Decreased coordination,Abnormal gait,Improper body mechanics,Pain,Postural dysfunction,Decreased strength,Decreased range of motion,Decreased endurance,Decreased balance,Difficulty walking,Hypomobility,Increased fascial restricitons,Decreased scar mobility  Visit Diagnosis: Unequal leg length  Sacrococcygeal disorders, not elsewhere classified  Other abnormalities of gait and mobility     Problem List Patient Active Problem List   Diagnosis Date Noted  . Stress incontinence 12/03/2020  . Altered bowel habits   . Tendinitis of upper biceps tendon of left shoulder 07/28/2019  . Anxiety and depression 07/03/2019  . Obstructive sleep apnea 07/03/2019  . Subjective pulsatile tinnitus of right ear 07/03/2019  . Nontraumatic incomplete tear of left rotator cuff 05/23/2019  . Rotator cuff tendinitis, left 05/23/2019  . Stress 12/22/2018  . Loose stools 12/22/2018  . Clinically isolated syndrome of brainstem ([place]) 12/22/2018  . Rotator cuff impingement syndrome of left shoulder 06/21/2018  . Prediabetes 06/23/2017  . Carpal tunnel syndrome 06/23/2017  . Vitamin D deficiency 06/23/2017  . Recurrent sinusitis 12/24/2016  . Multiple benign nevi 12/24/2016  . Allergic rhinitis 06/19/2016  .  Obesity 06/19/2016  . Hypothyroidism 11/20/2015  . GERD (gastroesophageal reflux disease) 11/20/2015    Jerl Mina ,PT, DPT, E-RYT  02/13/2021, 1:28 PM  [place] MAIN [facility] SERVICES [address] [place], [place], [address] Phone: [phone]   Fax:  [phone]  Name: Stephanie Shields MRN: [id number] Date of Birth: [birth year]

## 2021-02-18 DIAGNOSIS — G379 Demyelinating disease of central nervous system, unspecified: Secondary | ICD-10-CM | POA: Diagnosis not present

## 2021-02-18 DIAGNOSIS — D1800 Hemangioma unspecified site: Secondary | ICD-10-CM | POA: Diagnosis not present

## 2021-02-18 DIAGNOSIS — M722 Plantar fascial fibromatosis: Secondary | ICD-10-CM | POA: Diagnosis not present

## 2021-02-18 DIAGNOSIS — M79672 Pain in left foot: Secondary | ICD-10-CM | POA: Diagnosis not present

## 2021-02-19 ENCOUNTER — Ambulatory Visit: Payer: BC Managed Care – PPO

## 2021-02-20 ENCOUNTER — Ambulatory Visit: Payer: BC Managed Care – PPO | Admitting: Physical Therapy

## 2021-02-27 ENCOUNTER — Ambulatory Visit: Payer: BC Managed Care – PPO | Admitting: Physical Therapy

## 2021-02-27 ENCOUNTER — Other Ambulatory Visit: Payer: Self-pay

## 2021-02-27 DIAGNOSIS — M2170 Unequal limb length (acquired), unspecified site: Secondary | ICD-10-CM

## 2021-02-27 DIAGNOSIS — R2689 Other abnormalities of gait and mobility: Secondary | ICD-10-CM

## 2021-02-27 DIAGNOSIS — M533 Sacrococcygeal disorders, not elsewhere classified: Secondary | ICD-10-CM | POA: Diagnosis not present

## 2021-02-27 NOTE — Therapy (Signed)
[place] MAIN [facility] SERVICES [address] [place], [place], [address] Phone: [phone]   Fax:  [phone]  Physical Therapy Treatment  Patient Details  Name: Stephanie Shields MRN: [id number] Date of Birth: [birth year] Referring Provider (PT): Biagio Quint, MD   Encounter Date: 02/27/2021   PT End of Session - 02/27/21 1127    Visit Number 6    Number of Visits 10    Date for PT Re-Evaluation 03/13/21    PT Start Time 1109    PT Stop Time 6948    PT Time Calculation (min) 46 min           Past Medical History:  Diagnosis Date  . Allergic rhinitis   . Anxiety   . AVM (arteriovenous malformation) brain    MRI'S SHOW CHRONIC HEMORRHAGE IN CEREBELLUM  . 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 Elevated cholesterol   . GERD (gastroesophageal reflux disease)   . History of blood transfusion   . Hypothyroidism   . Low blood pressure   . Pre-diabetes   . Urinary incontinence    After delivery of her recent baby    Past Surgical History:  Procedure Laterality Date  . BICEPT TENODESIS  07/28/2019   Procedure: MINI OPEN BICEPS TENODESIS;  Surgeon: Corky Mull, MD;  Location: [facility] ORS;  Service: Orthopedics;;  . BLADDER SURGERY     as a child  . COLONOSCOPY WITH PROPOFOL N/A 02/01/2020   Procedure: COLONOSCOPY WITH PROPOFOL;  Surgeon: Lin Landsman, MD;  Location: [facility] ENDOSCOPY;  Service: Gastroenterology;  Laterality: N/A;  . ESOPHAGOGASTRODUODENOSCOPY (EGD) WITH PROPOFOL N/A 02/01/2020   Procedure: ESOPHAGOGASTRODUODENOSCOPY (EGD) WITH PROPOFOL;  Surgeon: Lin Landsman, MD;  Location: [facility] ENDOSCOPY;  Service: Gastroenterology;  Laterality: N/A;  . IR ANGIO INTRA EXTRACRAN SEL COM CAROTID INNOMINATE BILAT MOD SED  08/12/2019  . IR ANGIO VERTEBRAL SEL VERTEBRAL BILAT MOD SED  08/12/2019  . IR ANGIOGRAM EXTREMITY LEFT  08/12/2019  . IR US GUIDE VASC ACCESS RIGHT   08/12/2019  . MOUTH SURGERY    . NASAL SEPTUM SURGERY    . REFRACTIVE SURGERY    . SHOULDER ARTHROSCOPY WITH SUBACROMIAL DECOMPRESSION, ROTATOR CUFF REPAIR AND BICEP TENDON REPAIR Left 07/28/2019   Procedure: SHOULDER ARTHROSCOPY WITH, DEBIRDEMENT, SUBACROMIAL DECOMPRESSION, MINI OPEN ROTATOR CUFF REPAIR;  Surgeon: Corky Mull, MD;  Location: [facility] ORS;  Service: Orthopedics;  Laterality: Left;  . TONSILLECTOMY      There were no vitals filed for this visit.   Subjective Assessment - 02/27/21 1110    Subjective Pt got sick last week and experienced more leakage due to coughing. Pt will undergo a Tenex procedure for her L foot to remove scar tissue and addressing her heel spur on 03/12/21. Pt will be wearing a boot and using crutches post op and will be referred to PT.              [facility] PT Assessment - 02/27/21 1112      AROM   Overall AROM Comments [facility] L DF/EV 15 deg      Strength   Overall Strength Comments without UE difficulty with heel raises   5 reps on R, 2 reps on L MMT 4/5 no UE support                     Pelvic Floor Special Questions - 02/27/21 1147    External  Perineal Exam through clothing, tightness at R posterior pelvic floor mm tightness             [facility] Adult PT Treatment/Exercise - 02/27/21 1143      Neuro Re-ed    Neuro Re-ed Details  cued for propioception of foot. knee, hip to promote more hip ER/ knee abduction, and 1st metatartsal presure      Manual Therapy   Internal Pelvic Floor STM/MWM R pelvic floor at ischial tuberosity                       PT Long Term Goals - 02/27/21 1124      PT LONG TERM GOAL #1   Title Pt will improve her FOTO score for foot pain from 42 pts to > 53 pts in order walk and play with kids  ( 02/27/21: 56 pts)    Time 8    Period Weeks    Status Achieved      PT LONG TERM GOAL #2   Title Pt will improve FOTO for urinary problem score from 48 pts to > 58 pts in order to improve pelvic  function and QOL  ( 02/27/21:  54 pts)    Time 10    Period Weeks    Status Partially Met      PT LONG TERM GOAL #3   Title Pt will report noticing less L foot 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    Time 8    Period Weeks    Status Not Met      PT LONG TERM GOAL #4   Title Pt will report notneeding a urinary pad across one week  in order to practice improvde hygiene and have pelvic floor control    Time 6    Period Weeks    Status On-going      PT LONG TERM GOAL #5   Title Pt will demo equal pelvic girdle and shoulder height across 2 weeks with shoe lift in R shoe in order to minimzie foot pain and B hip pain and progress to deep core exercises for urinary Sx    Time 2    Status Achieved      PT LONG TERM GOAL #6   Title Pt will demo increased L  DF AROM ([facility]) from 10 deg to > 20 deg in order to promote improved gait, mobility in ankle. foot to minimize  plantar fasciitis and to stand for longer periods of time at work  ( 02/27/21: 15 deg)    Time 6    Period Weeks    Status Partially Met      PT LONG TERM GOAL #7   Title Pt will demo proper deep core coordination and progress with pelvic floor contractions 3 sec, 3 reps in order to minimize leakage    Time 8    Period Weeks    Status On-going                 Plan - 02/27/21 1130    Clinical Impression Statement   Pt showed less pelvic floor tightness on R after Tx today and demo'd proper sequential contraction of pelvic floor but kegels were still withheld. Focused on address lower kinetic chain propioception training to minmize adducted knees and supination which will inturn help to minimize tightness to pelvic floor mm.  Discussed gait mechanics.    Pt plans to undergo foot surgery to L foot to address scar tissue and heel spur on 03/12/21 and thus discussed d/c at next session.      Examination-Activity Limitations  Continence;Toileting;Stand    Stability/Clinical Decision Making Evolving/Moderate complexity    Rehab Potential Good    PT Frequency 1x / week    PT Duration Other (comment)   10   PT Treatment/Interventions Moist Heat;Stair training;Neuromuscular re-education;Therapeutic activities;Therapeutic exercise;Functional mobility training;Patient/family education;Manual techniques;Balance training;Energy conservation;Splinting;Manual lymph drainage;Taping;Scar mobilization;Joint Manipulations;Spinal Manipulations;Gait training;Cryotherapy;Dry needling    Consulted and Agree with Plan of Care Patient           Patient will benefit from skilled therapeutic intervention in order to improve the following deficits and impairments:  Increased muscle spasms,Decreased mobility,Decreased coordination,Abnormal gait,Improper body mechanics,Pain,Postural dysfunction,Decreased strength,Decreased range of motion,Decreased endurance,Decreased balance,Difficulty walking,Hypomobility,Increased fascial restricitons,Decreased scar mobility  Visit Diagnosis: Unequal leg length  Sacrococcygeal disorders, not elsewhere classified  Other abnormalities of gait and mobility     Problem List Patient Active Problem List   Diagnosis Date Noted  . Stress incontinence 12/03/2020  . Altered bowel habits   . Tendinitis of upper biceps tendon of left shoulder 07/28/2019  . Anxiety and depression 07/03/2019  . Obstructive sleep apnea 07/03/2019  . Subjective pulsatile tinnitus of right ear 07/03/2019  . Nontraumatic incomplete tear of left rotator cuff 05/23/2019  . Rotator cuff tendinitis, left 05/23/2019  . Stress 12/22/2018  . Loose stools 12/22/2018  . Clinically isolated syndrome of brainstem ([place]) 12/22/2018  . Rotator cuff impingement syndrome of left shoulder 06/21/2018  . Prediabetes 06/23/2017  . Carpal tunnel syndrome 06/23/2017  . Vitamin D deficiency 06/23/2017  . Recurrent sinusitis 12/24/2016  .  Multiple benign nevi 12/24/2016  . Allergic rhinitis 06/19/2016  . Obesity 06/19/2016  . Hypothyroidism 11/20/2015  . GERD (gastroesophageal reflux disease) 11/20/2015    Jerl Mina ,PT, DPT, E-RYT  02/27/2021, 11:56 AM  [place] MAIN [facility] SERVICES [address] [place], [place], [address] Phone: [phone]   Fax:  [phone]  Name: TYSHIKA BALDRIDGE MRN: [id number] Date of Birth: [birth year]

## 2021-02-27 NOTE — Patient Instructions (Addendum)
    WALKING WITH RESISTANCE BLUE Band at waist connected to doorknob 7mins Stepping forward normal length steps, planting mid and forefoot down, center of mass ( navel) leans forward slightly as if you were walking uphill 3-4 steps till band feels taut ( MAKE SURE THE DOOR IS LOCKED AND WON'T OPEN)   Stepping backwards, lower heel slowly, carry trunk and hips back , leaning forward, front knee along 2-3 rd toe line    2 min  ____   Letter T, [place] on one leg with band band under L foot, wrap by big toe then, outer knee/ thigh, L hand pulling ( elbow by side)  Plant [place], toes spread, thigh out against band,, tracking knee in line with 2-3rd toe line Dipping forward, R foot lifts slight ( whole body like a see saw) off floor or  Press back foot against wall 20 reps  Both    Bend R elbow, elbow by ear  Inhale, exhale, straighten knee        __  Heel raises with one hand on counter 10reps x 3 a day  __  Static balance  30 sec on one foot, track knee out

## 2021-03-05 DIAGNOSIS — G4733 Obstructive sleep apnea (adult) (pediatric): Secondary | ICD-10-CM | POA: Diagnosis not present

## 2021-03-06 ENCOUNTER — Ambulatory Visit
Admission: RE | Admit: 2021-03-06 | Discharge: 2021-03-06 | Disposition: A | Discharge status: Home / Self Care | Payer: BC Managed Care – PPO | Source: Ambulatory Visit | Attending: Internal Medicine | Admitting: Internal Medicine

## 2021-03-06 ENCOUNTER — Ambulatory Visit: Payer: BC Managed Care – PPO | Attending: Family Medicine | Admitting: Physical Therapy

## 2021-03-06 ENCOUNTER — Other Ambulatory Visit: Payer: Self-pay

## 2021-03-06 DIAGNOSIS — M2170 Unequal limb length (acquired), unspecified site: Secondary | ICD-10-CM | POA: Diagnosis not present

## 2021-03-06 DIAGNOSIS — R2689 Other abnormalities of gait and mobility: Secondary | ICD-10-CM | POA: Diagnosis not present

## 2021-03-06 DIAGNOSIS — M533 Sacrococcygeal disorders, not elsewhere classified: Secondary | ICD-10-CM

## 2021-03-06 DIAGNOSIS — K469 Unspecified abdominal hernia without obstruction or gangrene: Secondary | ICD-10-CM | POA: Diagnosis not present

## 2021-03-06 DIAGNOSIS — R1905 Periumbilic swelling, mass or lump: Secondary | ICD-10-CM

## 2021-03-06 DIAGNOSIS — K429 Umbilical hernia without obstruction or gangrene: Secondary | ICD-10-CM | POA: Diagnosis not present

## 2021-03-06 DIAGNOSIS — K439 Ventral hernia without obstruction or gangrene: Secondary | ICD-10-CM | POA: Diagnosis not present

## 2021-03-06 DIAGNOSIS — D259 Leiomyoma of uterus, unspecified: Secondary | ICD-10-CM | POA: Diagnosis not present

## 2021-03-07 NOTE — Therapy (Signed)
[place] MAIN [facility] SERVICES [address] [place], [place], [address] Phone: [phone]   Fax:  [phone]  Physical Therapy Treatment  Patient Details  Name: Stephanie Shields MRN: [id number] Date of Birth: 09-26-1971 Referring Provider (PT): Biagio Quint, MD   Encounter Date: 03/06/2021   PT End of Session - 03/06/21 1112    Visit Number 7    Number of Visits 10    Date for PT Re-Evaluation 03/13/21    PT Start Time 1106    PT Stop Time 1200    PT Time Calculation (min) 54 min           Past Medical History:  Diagnosis Date  . Allergic rhinitis   . Anxiety   . AVM (arteriovenous malformation) brain    MRI'S SHOW CHRONIC HEMORRHAGE IN CEREBELLUM  . 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 Elevated cholesterol   . GERD (gastroesophageal reflux disease)   . History of blood transfusion   . Hypothyroidism   . Low blood pressure   . Pre-diabetes   . Urinary incontinence    After delivery of her recent baby    Past Surgical History:  Procedure Laterality Date  . BICEPT TENODESIS  07/28/2019   Procedure: MINI OPEN BICEPS TENODESIS;  Surgeon: Corky Mull, MD;  Location: [facility] ORS;  Service: Orthopedics;;  . BLADDER SURGERY     as a child  . COLONOSCOPY WITH PROPOFOL N/A 02/01/2020   Procedure: COLONOSCOPY WITH PROPOFOL;  Surgeon: Lin Landsman, MD;  Location: [facility] ENDOSCOPY;  Service: Gastroenterology;  Laterality: N/A;  . ESOPHAGOGASTRODUODENOSCOPY (EGD) WITH PROPOFOL N/A 02/01/2020   Procedure: ESOPHAGOGASTRODUODENOSCOPY (EGD) WITH PROPOFOL;  Surgeon: Lin Landsman, MD;  Location: [facility] ENDOSCOPY;  Service: Gastroenterology;  Laterality: N/A;  . IR ANGIO INTRA EXTRACRAN SEL COM CAROTID INNOMINATE BILAT MOD SED  08/12/2019  . IR ANGIO VERTEBRAL SEL VERTEBRAL BILAT MOD SED  08/12/2019  . IR ANGIOGRAM EXTREMITY LEFT  08/12/2019  . IR US GUIDE VASC ACCESS RIGHT   08/12/2019  . MOUTH SURGERY    . NASAL SEPTUM SURGERY    . REFRACTIVE SURGERY    . SHOULDER ARTHROSCOPY WITH SUBACROMIAL DECOMPRESSION, ROTATOR CUFF REPAIR AND BICEP TENDON REPAIR Left 07/28/2019   Procedure: SHOULDER ARTHROSCOPY WITH, DEBIRDEMENT, SUBACROMIAL DECOMPRESSION, MINI OPEN ROTATOR CUFF REPAIR;  Surgeon: Corky Mull, MD;  Location: [facility] ORS;  Service: Orthopedics;  Laterality: Left;  . TONSILLECTOMY      There were no vitals filed for this visit.   Subjective Assessment - 03/06/21 1111    Subjective Pt liked the exercsies with the band and felt sore the next day . Pt's foot surgery got postponed to May                          Pelvic Floor Special Questions - 03/07/21 1604    External Perineal Exam tightness / restrictions at  L SIJ/ coccygeus         Internal Pelvic Exam:  Increased perineal restrictions     [facility] Adult PT Treatment/Exercise - 03/07/21 1604      Manual Therapy   Manual therapy comments STM/MWM at L SIJ/ coccygeus  Fascial releases perineal restrictions                       PT Long Term Goals - 02/27/21 1124  PT LONG TERM GOAL #1   Title Pt will improve her FOTO score for foot pain from 42 pts to > 53 pts in order walk and play with kids  ( 02/27/21: 56 pts)    Time 8    Period Weeks    Status Achieved      PT LONG TERM GOAL #2   Title Pt will improve FOTO for urinary problem score from 48 pts to > 58 pts in order to improve pelvic function and QOL  ( 02/27/21:  54 pts)    Time 10    Period Weeks    Status Partially Met      PT LONG TERM GOAL #3   Title Pt will report noticing less L foot pain by 50% 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    Time 8    Period Weeks    Status Not Met      PT LONG TERM GOAL #4   Title Pt will report notneeding a urinary pad across one week  in order to practice improvde hygiene and have pelvic floor  control    Time 6    Period Weeks    Status On-going      PT LONG TERM GOAL #5   Title Pt will demo equal pelvic girdle and shoulder height across 2 weeks with shoe lift in R shoe in order to minimzie foot pain and B hip pain and progress to deep core exercises for urinary Sx    Time 2    Status Achieved      PT LONG TERM GOAL #6   Title Pt will demo increased L  DF AROM ([facility]) from 10 deg to > 20 deg in order to promote improved gait, mobility in ankle. foot to minimize  plantar fasciitis and to stand for longer periods of time at work  ( 02/27/21: 15 deg)    Time 6    Period Weeks    Status Partially Met      PT LONG TERM GOAL #7   Title Pt will demo proper deep core coordination and progress with pelvic floor contractions 3 sec, 3 reps in order to minimize leakage    Time 8    Period Weeks    Status On-going                 Plan - 03/06/21 1217    Clinical Impression Statement Pt required manual Tx to minimize L SIJ and coccygeus mm tightness. Pt demo'd increased mobility post Tx. Pt demo'd improved pelvic floor mobility without cuing for less ab strain. Pt also demo'd less perineal scar restrictions after internal manual Tx.  Pt continues to benefit from skilled PT to promote more pelvic floor and SIJ mobility.    Examination-Activity Limitations Continence;Toileting;Stand    Stability/Clinical Decision Making Evolving/Moderate complexity    Rehab Potential Good    PT Frequency 1x / week    PT Duration Other (comment)   10   PT Treatment/Interventions Moist Heat;Stair training;Neuromuscular re-education;Therapeutic activities;Therapeutic exercise;Functional mobility training;Patient/family education;Manual techniques;Balance training;Energy conservation;Splinting;Manual lymph drainage;Taping;Scar mobilization;Joint Manipulations;Spinal Manipulations;Gait training;Cryotherapy;Dry needling    Consulted and Agree with Plan of Care Patient           Patient will benefit  from skilled therapeutic intervention in order to improve the following deficits and impairments:  Increased muscle spasms,Decreased mobility,Decreased coordination,Abnormal gait,Improper body mechanics,Pain,Postural dysfunction,Decreased strength,Decreased range  of motion,Decreased endurance,Decreased balance,Difficulty walking,Hypomobility,Increased fascial restricitons,Decreased scar mobility  Visit Diagnosis: Unequal leg length  Sacrococcygeal disorders, not elsewhere classified  Other abnormalities of gait and mobility     Problem List Patient Active Problem List   Diagnosis Date Noted  . Stress incontinence 12/03/2020  . Altered bowel habits   . Tendinitis of upper biceps tendon of left shoulder 07/28/2019  . Anxiety and depression 07/03/2019  . Obstructive sleep apnea 07/03/2019  . Subjective pulsatile tinnitus of right ear 07/03/2019  . Nontraumatic incomplete tear of left rotator cuff 05/23/2019  . Rotator cuff tendinitis, left 05/23/2019  . Stress 12/22/2018  . Loose stools 12/22/2018  . 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 Rotator cuff impingement syndrome of left shoulder 06/21/2018  . Prediabetes 06/23/2017  . Carpal tunnel syndrome 06/23/2017  . Vitamin D deficiency 06/23/2017  . Recurrent sinusitis 12/24/2016  . Multiple benign nevi 12/24/2016  . Allergic rhinitis 06/19/2016  . Obesity 06/19/2016  . Hypothyroidism 11/20/2015  . GERD (gastroesophageal reflux disease) 11/20/2015    Jerl Mina ,PT, DPT, E-RYT  03/07/2021, 4:05 PM  [place] MAIN [facility] SERVICES [address] [place], [place], [address] Phone: [phone]   Fax:  [phone]  Name: Stephanie Shields MRN: [id number] Date of Birth: [birth year]

## 2021-03-08 ENCOUNTER — Encounter: Payer: Self-pay | Admitting: Internal Medicine

## 2021-03-12 ENCOUNTER — Other Ambulatory Visit: Payer: Self-pay

## 2021-03-12 ENCOUNTER — Ambulatory Visit: Payer: BC Managed Care – PPO | Admitting: Physical Therapy

## 2021-03-12 DIAGNOSIS — R2689 Other abnormalities of gait and mobility: Secondary | ICD-10-CM | POA: Diagnosis not present

## 2021-03-12 DIAGNOSIS — M2170 Unequal limb length (acquired), unspecified site: Secondary | ICD-10-CM | POA: Diagnosis not present

## 2021-03-12 DIAGNOSIS — M533 Sacrococcygeal disorders, not elsewhere classified: Secondary | ICD-10-CM

## 2021-03-13 ENCOUNTER — Other Ambulatory Visit: Payer: Self-pay | Admitting: *Deleted

## 2021-03-13 ENCOUNTER — Other Ambulatory Visit: Payer: Self-pay

## 2021-03-13 ENCOUNTER — Encounter: Payer: Self-pay | Admitting: Family Medicine

## 2021-03-13 ENCOUNTER — Ambulatory Visit (INDEPENDENT_AMBULATORY_CARE_PROVIDER_SITE_OTHER): Payer: BC Managed Care – PPO | Admitting: Family Medicine

## 2021-03-13 ENCOUNTER — Ambulatory Visit: Payer: BC Managed Care – PPO | Admitting: Physical Therapy

## 2021-03-13 DIAGNOSIS — K760 Fatty (change of) liver, not elsewhere classified: Secondary | ICD-10-CM | POA: Insufficient documentation

## 2021-03-13 DIAGNOSIS — E038 Other specified hypothyroidism: Secondary | ICD-10-CM

## 2021-03-13 DIAGNOSIS — K429 Umbilical hernia without obstruction or gangrene: Secondary | ICD-10-CM

## 2021-03-13 DIAGNOSIS — E063 Autoimmune thyroiditis: Secondary | ICD-10-CM

## 2021-03-13 DIAGNOSIS — E6609 Other obesity due to excess calories: Secondary | ICD-10-CM | POA: Diagnosis not present

## 2021-03-13 DIAGNOSIS — Z6837 Body mass index (BMI) 37.0-37.9, adult: Secondary | ICD-10-CM

## 2021-03-13 NOTE — Progress Notes (Signed)
Marikay Alar, MD Phone: [phone]  Stephanie Shields is a 50 y.o. female who presents today for follow-up.  Fatty liver/obesity: Patient notes no right upper quadrant abdominal pain.  She notes her exercise has been limited with a plantar fasciitis issue though she notes she wants to start rowing and wonders if that is okay with the umbilical hernia.  She notes her diet has been all over the place.  She tries to get plenty of vegetables.  She notes it has been somewhat worse lately as her job has been more busy and she has been eating out some though she tries to choose healthy options.  Umbilical hernia: Patient notes this has been present for a while as a bump around her umbilicus.  She generally has bowel movements daily.  She notes it was sore for a brief period of time though has not recurred.  CT imaging revealed the umbilical hernia.  She wonders about seeing a Careers adviser.  Hypothyroidism: Taking Synthroid.  No skin changes.  No heat or cold intolerance.  Patient also reports she is seeing psychology for an ADHD evaluation.  Social History   Tobacco Use  Smoking Status Never Smoker  Smokeless Tobacco Never Used    Current Outpatient Medications on File Prior to Visit  Medication Sig Dispense Refill  . azelastine (ASTELIN) 0.1 % nasal spray PLACE 2 SPRAYS INTO BOTH NOSTRILS 2 (TWO) TIMES DAILY. USE IN EACH NOSTRIL AS DIRECTED 30 mL 1  . Cholecalciferol (VITAMIN D) 50 MCG (2000 UT) tablet Take 2,000 Units by mouth daily at 3 pm.    . famotidine (PEPCID) 20 MG tablet Take 20 mg by mouth 2 (two) times daily.    Marland Kitchen levothyroxine (SYNTHROID) 150 MCG tablet TAKE 1 TABLET (150 MCG TOTAL) BY MOUTH DAILY BEFORE BREAKFAST. 90 tablet 3  . Probiotic CAPS      No current facility-administered medications on file prior to visit.     ROS see history of present illness  Objective  Physical Exam Vitals:   03/13/21 0908  BP: 110/80  Pulse: 65  Temp: 98.6 F (37 C)  SpO2: 98%    BP  Readings from Last 3 Encounters:  03/13/21 110/80  01/31/21 106/66  05/23/20 120/80   Wt Readings from Last 3 Encounters:  03/13/21 235 lb 6.4 oz (106.8 kg)  01/31/21 235 lb 9.6 oz (106.9 kg)  05/23/20 223 lb 12.8 oz (101.5 kg)    Physical Exam Constitutional:      General: She is not in acute distress.    Appearance: She is not diaphoretic.  Cardiovascular:     Rate and Rhythm: Normal rate and regular rhythm.     Heart sounds: Normal heart sounds.  Pulmonary:     Effort: Pulmonary effort is normal.     Breath sounds: Normal breath sounds.  Abdominal:     General: Bowel sounds are normal. There is no distension.     Palpations: Abdomen is soft.     Tenderness: There is no abdominal tenderness.     Comments: Hernia was not palpated at umbilicus  Skin:    General: Skin is warm and dry.  Neurological:     Mental Status: She is alert.      Assessment/Plan: Please see individual problem list.  Problem List Items Addressed This Visit    Hypothyroidism    Plan for TSH in June.  She will continue Synthroid 150 mcg once daily.      Relevant Orders   TSH  Obesity    Discussed that she could do rowing as exercise.  Encouraged healthier diet with cooking more at home.      Relevant Orders   HgB A1c   Fatty liver    Discussed that this could be treated with diet exercise and weight loss.  Discussed that there are some long-term risks of inflammation and fibrosis with fatty liver.  Encouraged healthier diet and exercise.  Discussed checking liver enzymes with her upcoming lab work and if elevated would refer to GI.      Relevant Orders   Comp Met (CMET)   Lipid panel   HgB J0K   Umbilical hernia    The patient is requesting a referral to general surgery.  This referral was placed.  I discussed hernia return precautions.      Relevant Orders   Ambulatory referral to General Surgery      This visit occurred during the SARS-CoV-2 public health emergency.  Safety  protocols were in place, including screening questions prior to the visit, additional usage of staff PPE, and extensive cleaning of exam room while observing appropriate contact time as indicated for disinfecting solutions.    [name], MD [place]

## 2021-03-13 NOTE — Assessment & Plan Note (Signed)
Discussed that she could do rowing as exercise.  Encouraged healthier diet with cooking more at home.

## 2021-03-13 NOTE — Assessment & Plan Note (Signed)
Plan for TSH in June.  She will continue Synthroid 150 mcg once daily.

## 2021-03-13 NOTE — Assessment & Plan Note (Signed)
Discussed that this could be treated with diet exercise and weight loss.  Discussed that there are some long-term risks of inflammation and fibrosis with fatty liver.  Encouraged healthier diet and exercise.  Discussed checking liver enzymes with her upcoming lab work and if elevated would refer to GI.

## 2021-03-13 NOTE — Assessment & Plan Note (Signed)
The patient is requesting a referral to general surgery.  This referral was placed.  I discussed hernia return precautions.

## 2021-03-13 NOTE — Therapy (Signed)
[place] MAIN [facility] SERVICES [address] [place], [place], [address] Phone: [phone]   Fax:  [phone]  Physical Therapy Treatment / Progress Note   Patient Details  Name: Stephanie Shields MRN: [id number] Date of Birth: [birth year] Referring Provider (PT): Biagio Quint, MD   Encounter Date: 03/12/2021   PT End of Session - 03/12/21 1439    Visit Number 8    Date for PT Re-Evaluation 04/09/21    PT Start Time 1430    PT Stop Time 1530    PT Time Calculation (min) 60 min           Past Medical History:  Diagnosis Date  . Allergic rhinitis   . Anxiety   . AVM (arteriovenous malformation) brain    MRI'S SHOW CHRONIC HEMORRHAGE IN CEREBELLUM  . 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 Elevated cholesterol   . GERD (gastroesophageal reflux disease)   . History of blood transfusion   . Hypothyroidism   . Low blood pressure   . Pre-diabetes   . Urinary incontinence    After delivery of her recent baby    Past Surgical History:  Procedure Laterality Date  . BICEPT TENODESIS  07/28/2019   Procedure: MINI OPEN BICEPS TENODESIS;  Surgeon: Corky Mull, MD;  Location: [facility] ORS;  Service: Orthopedics;;  . BLADDER SURGERY     as a child  . COLONOSCOPY WITH PROPOFOL N/A 02/01/2020   Procedure: COLONOSCOPY WITH PROPOFOL;  Surgeon: Lin Landsman, MD;  Location: [facility] ENDOSCOPY;  Service: Gastroenterology;  Laterality: N/A;  . ESOPHAGOGASTRODUODENOSCOPY (EGD) WITH PROPOFOL N/A 02/01/2020   Procedure: ESOPHAGOGASTRODUODENOSCOPY (EGD) WITH PROPOFOL;  Surgeon: Lin Landsman, MD;  Location: [facility] ENDOSCOPY;  Service: Gastroenterology;  Laterality: N/A;  . IR ANGIO INTRA EXTRACRAN SEL COM CAROTID INNOMINATE BILAT MOD SED  08/12/2019  . IR ANGIO VERTEBRAL SEL VERTEBRAL BILAT MOD SED  08/12/2019  . IR ANGIOGRAM EXTREMITY LEFT  08/12/2019  . IR US GUIDE VASC ACCESS RIGHT  08/12/2019  .  MOUTH SURGERY    . NASAL SEPTUM SURGERY    . REFRACTIVE SURGERY    . SHOULDER ARTHROSCOPY WITH SUBACROMIAL DECOMPRESSION, ROTATOR CUFF REPAIR AND BICEP TENDON REPAIR Left 07/28/2019   Procedure: SHOULDER ARTHROSCOPY WITH, DEBIRDEMENT, SUBACROMIAL DECOMPRESSION, MINI OPEN ROTATOR CUFF REPAIR;  Surgeon: Corky Mull, MD;  Location: [facility] ORS;  Service: Orthopedics;  Laterality: Left;  . TONSILLECTOMY      There were no vitals filed for this visit.   Subjective Assessment - 03/12/21 1453    Subjective Pt reported her L hip felt alot better after last session. Today it feels less tight compared to R.              [facility] PT Assessment - 03/13/21 1046      AROM   Overall AROM Comments increased mobility in hips, hypomobility thoracic spine                         [facility] Adult PT Treatment/Exercise - 03/13/21 1046      Therapeutic Activites    Other Therapeutic Activities discussed POC and scheduled more visits      Moist Heat Therapy   Number Minutes Moist Heat 5 Minutes    Moist Heat Location --   sacrum in support childs pose with guided resisted diaphragmatic breathing     Manual Therapy   Manual therapy  comments STM/MWM , rotational mob on R paraspinals/ thoracic                       PT Long Term Goals - 03/12/21 1439      PT LONG TERM GOAL #1   Title Pt will improve her FOTO score for foot pain from 42 pts to > 53 pts in order walk and play with kids  ( 02/27/21: 56 pts)    Time 8    Period Weeks    Status Achieved      PT LONG TERM GOAL #2   Title Pt will improve FOTO for urinary problem score from 48 pts to > 58 pts in order to improve pelvic function and QOL  ( 02/27/21:  54 pts)    Time 10    Period Weeks    Status Partially Met      PT LONG TERM GOAL #3   Title Pt will report noticing less L foot 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    Time 8     Period Weeks    Status Not Met      PT LONG TERM GOAL #4   Title Pt will report not needing a urinary pad across one week  in order to practice improvde hygiene and have pelvic floor control    Time 6    Period Weeks    Status On-going      PT LONG TERM GOAL #5   Title Pt will demo equal pelvic girdle and shoulder height across 2 weeks with shoe lift in R shoe in order to minimzie foot pain and B hip pain and progress to deep core exercises for urinary Sx    Time 2    Status Achieved      PT LONG TERM GOAL #6   Title Pt will demo increased L  DF AROM ([facility]) from 10 deg to > 20 deg in order to promote improved gait, mobility in ankle. foot to minimize  plantar fasciitis and to stand for longer periods of time at work  ( 02/27/21: 15 deg)    Time 6    Period Weeks    Status Partially Met      PT LONG TERM GOAL #7   Title Pt will demo proper deep core coordination and progress with pelvic floor contractions 3 sec, 3 reps in order to minimize leakage    Time 8    Period Weeks    Status On-going                 Plan - 03/12/21 1445    Clinical Impression Statement  Pt has achieved 2/ 7 goals and still progressing well towards remaining goals. Pt's urinary score on FOTO has improved which indicates improved function. Pt's shorter leg difference has been adjusted with shoe lift which has helped even out pelvic girdle and spinal alignment. Pt's back and hip mm tightness have decreased.  Pt 's pelvic floor mm tightness and perineal scars have decreased but still required more skilled PT to address in order for pt to progress towards relaxation/ strengthening of pelvic floor mm.  Plan to advance pelvic floor HEP for next sessions. Pt will undergo foot surgery on L foot in May and thus, plan to d/c before this surgery.       Examination-Activity Limitations Continence;Toileting;Stand  Stability/Clinical Decision Making Evolving/Moderate complexity    Rehab Potential Good    PT  Frequency 1x / week    PT Duration Other (comment)   10   PT Treatment/Interventions Moist Heat;Stair training;Neuromuscular re-education;Therapeutic activities;Therapeutic exercise;Functional mobility training;Patient/family education;Manual techniques;Balance training;Energy conservation;Splinting;Manual lymph drainage;Taping;Scar mobilization;Joint Manipulations;Spinal Manipulations;Gait training;Cryotherapy;Dry needling    Consulted and Agree with Plan of Care Patient           Patient will benefit from skilled therapeutic intervention in order to improve the following deficits and impairments:  Increased muscle spasms,Decreased mobility,Decreased coordination,Abnormal gait,Improper body mechanics,Pain,Postural dysfunction,Decreased strength,Decreased range of motion,Decreased endurance,Decreased balance,Difficulty walking,Hypomobility,Increased fascial restricitons,Decreased scar mobility  Visit Diagnosis: Unequal leg length  Sacrococcygeal disorders, not elsewhere classified  Other abnormalities of gait and mobility     Problem List Patient Active Problem List   Diagnosis Date Noted  . Fatty liver 03/13/2021  . Umbilical hernia 48/88/9169  . Stress incontinence 12/03/2020  . Altered bowel habits   . Tendinitis of upper biceps tendon of left shoulder 07/28/2019  . Anxiety and depression 07/03/2019  . Obstructive sleep apnea 07/03/2019  . Subjective pulsatile tinnitus of right ear 07/03/2019  . Nontraumatic incomplete tear of left rotator cuff 05/23/2019  . Rotator cuff tendinitis, left 05/23/2019  . Stress 12/22/2018  . Loose stools 12/22/2018  . Clinically isolated syndrome of brainstem ([place]) 12/22/2018  . Rotator cuff impingement syndrome of left shoulder 06/21/2018  . Prediabetes 06/23/2017  . Carpal tunnel syndrome 06/23/2017  . Vitamin D deficiency 06/23/2017  . Recurrent sinusitis 12/24/2016  . Multiple benign nevi 12/24/2016  . Allergic rhinitis 06/19/2016  .  Obesity 06/19/2016  . Hypothyroidism 11/20/2015  . GERD (gastroesophageal reflux disease) 11/20/2015    Jerl Mina ,PT, DPT, E-RYT  03/13/2021, 10:47 AM  [place] MAIN [facility] SERVICES [address] [place], [place], [address] Phone: [phone]   Fax:  [phone]  Name: CLARABELLE OSCARSON MRN: [id number] Date of Birth: 1971-01-26

## 2021-03-13 NOTE — Patient Instructions (Signed)
Nice to see you. We will refer you to a general surgeon for the umbilical hernia.

## 2021-03-20 DIAGNOSIS — F909 Attention-deficit hyperactivity disorder, unspecified type: Secondary | ICD-10-CM | POA: Diagnosis not present

## 2021-03-21 ENCOUNTER — Other Ambulatory Visit: Payer: Self-pay

## 2021-03-21 ENCOUNTER — Ambulatory Visit: Payer: BC Managed Care – PPO | Admitting: Physical Therapy

## 2021-03-21 DIAGNOSIS — R2689 Other abnormalities of gait and mobility: Secondary | ICD-10-CM | POA: Diagnosis not present

## 2021-03-21 DIAGNOSIS — M533 Sacrococcygeal disorders, not elsewhere classified: Secondary | ICD-10-CM

## 2021-03-21 DIAGNOSIS — M2170 Unequal limb length (acquired), unspecified site: Secondary | ICD-10-CM

## 2021-03-21 NOTE — Therapy (Addendum)
[place] MAIN [facility] SERVICES [address] [place], [place], [address] Phone: [phone]   Fax:  [phone]  Physical Therapy Treatment  Patient Details  Name: Stephanie Shields MRN: [id number] Date of Birth: 07-Nov-1971 Referring Provider (PT): Biagio Quint, MD   Encounter Date: 03/21/2021   PT End of Session - 03/21/21 1552    Visit Number 9    Date for PT Re-Evaluation 04/09/21    PT Start Time 1520    PT Stop Time 1600    PT Time Calculation (min) 40 min    Activity Tolerance Patient tolerated treatment well           Past Medical History:  Diagnosis Date  . Allergic rhinitis   . Anxiety   . AVM (arteriovenous malformation) brain    MRI'S SHOW CHRONIC HEMORRHAGE IN CEREBELLUM  . 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 Elevated cholesterol   . GERD (gastroesophageal reflux disease)   . History of blood transfusion   . Hypothyroidism   . Low blood pressure   . Pre-diabetes   . Urinary incontinence    After delivery of her recent baby    Past Surgical History:  Procedure Laterality Date  . BICEPT TENODESIS  07/28/2019   Procedure: MINI OPEN BICEPS TENODESIS;  Surgeon: Corky Mull, MD;  Location: [facility] ORS;  Service: Orthopedics;;  . BLADDER SURGERY     as a child  . COLONOSCOPY WITH PROPOFOL N/A 02/01/2020   Procedure: COLONOSCOPY WITH PROPOFOL;  Surgeon: Lin Landsman, MD;  Location: [facility] ENDOSCOPY;  Service: Gastroenterology;  Laterality: N/A;  . ESOPHAGOGASTRODUODENOSCOPY (EGD) WITH PROPOFOL N/A 02/01/2020   Procedure: ESOPHAGOGASTRODUODENOSCOPY (EGD) WITH PROPOFOL;  Surgeon: Lin Landsman, MD;  Location: [facility] ENDOSCOPY;  Service: Gastroenterology;  Laterality: N/A;  . IR ANGIO INTRA EXTRACRAN SEL COM CAROTID INNOMINATE BILAT MOD SED  08/12/2019  . IR ANGIO VERTEBRAL SEL VERTEBRAL BILAT MOD SED  08/12/2019  . IR ANGIOGRAM EXTREMITY LEFT  08/12/2019  . IR US  GUIDE VASC ACCESS RIGHT  08/12/2019  . MOUTH SURGERY    . NASAL SEPTUM SURGERY    . REFRACTIVE SURGERY    . SHOULDER ARTHROSCOPY WITH SUBACROMIAL DECOMPRESSION, ROTATOR CUFF REPAIR AND BICEP TENDON REPAIR Left 07/28/2019   Procedure: SHOULDER ARTHROSCOPY WITH, DEBIRDEMENT, SUBACROMIAL DECOMPRESSION, MINI OPEN ROTATOR CUFF REPAIR;  Surgeon: Corky Mull, MD;  Location: [facility] ORS;  Service: Orthopedics;  Laterality: Left;  . TONSILLECTOMY      There were no vitals filed for this visit.   Subjective Assessment - 03/21/21 1524    Subjective Pt noticed there is more movement in her hips this week and there is less pain.                          Pelvic Floor Special Questions - 03/21/21 1553    Pelvic Floor Internal Exam pt consented and had no contraindications    Exam Type Vaginal    Palpation tightness at at perineal scar 5-8 o' clock,    Strength fair squeeze, definite lift    Strength # of reps 2    Strength # of seconds 3             [facility] Adult PT Treatment/Exercise - 03/21/21 1552      Neuro Re-ed    Neuro Re-ed Details  cued for proper endurance kegels, mini squat  Moist Heat Therapy   Number Minutes Moist Heat 5 Minutes    Moist Heat Location --   coccyx     Manual Therapy   Manual therapy comments MWM with fascial releases at perineal scar 5-8 o'clock                       PT Long Term Goals - 03/12/21 1439      PT LONG TERM GOAL #1   Title Pt will improve her FOTO score for foot pain from 42 pts to > 53 pts in order walk and play with kids  ( 02/27/21: 56 pts)    Time 8    Period Weeks    Status Achieved      PT LONG TERM GOAL #2   Title Pt will improve FOTO for urinary problem score from 48 pts to > 58 pts in order to improve pelvic function and QOL  ( 02/27/21:  54 pts)    Time 10    Period Weeks    Status Partially Met      PT LONG TERM GOAL #3   Title Pt will report noticing less L foot pain by 50% by 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    Time 8    Period Weeks    Status Not Met      PT LONG TERM GOAL #4   Title Pt will report not needing a urinary pad across one week  in order to practice improvde hygiene and have pelvic floor control    Time 6    Period Weeks    Status On-going      PT LONG TERM GOAL #5   Title Pt will demo equal pelvic girdle and shoulder height across 2 weeks with shoe lift in R shoe in order to minimzie foot pain and B hip pain and progress to deep core exercises for urinary Sx    Time 2    Status Achieved      PT LONG TERM GOAL #6   Title Pt will demo increased L  DF AROM ([facility]) from 10 deg to > 20 deg in order to promote improved gait, mobility in ankle. foot to minimize  plantar fasciitis and to stand for longer periods of time at work  ( 02/27/21: 15 deg)    Time 6    Period Weeks    Status Partially Met      PT LONG TERM GOAL #7   Title Pt will demo proper deep core coordination and progress with pelvic floor contractions 3 sec, 3 reps in order to minimize leakage    Time 8    Period Weeks    Status On-going                 Plan - 03/21/21 1552    Clinical Impression Statement Pt demo'd decreased perineal scar restrictions but still required manual Tx to further minimize fascial restrictions. Pt advanced to endurance contractions to and showed no more lowered bladder position. Pt demo more sacral mobility and mini squat was added to HEP with cues required. Anticipate with decreased perineal scar restrictions, nutation of sacrum can be restored and overactivity of pelvic floor mm will be minimized. Pt continues to benefit from skilled PT  Session abbreviated due to pt arriving 20 min late.    Examination-Activity Limitations Continence;Toileting;Stand    Stability/Clinical  Decision Making Evolving/Moderate complexity    Rehab Potential Good    PT Frequency 1x / week    PT Duration Other  (comment)   10   PT Treatment/Interventions Moist Heat;Stair training;Neuromuscular re-education;Therapeutic activities;Therapeutic exercise;Functional mobility training;Patient/family education;Manual techniques;Balance training;Energy conservation;Splinting;Manual lymph drainage;Taping;Scar mobilization;Joint Manipulations;Spinal Manipulations;Gait training;Cryotherapy;Dry needling    Consulted and Agree with Plan of Care Patient           Patient will benefit from skilled therapeutic intervention in order to improve the following deficits and impairments:  Increased muscle spasms,Decreased mobility,Decreased coordination,Abnormal gait,Improper body mechanics,Pain,Postural dysfunction,Decreased strength,Decreased range of motion,Decreased endurance,Decreased balance,Difficulty walking,Hypomobility,Increased fascial restricitons,Decreased scar mobility  Visit Diagnosis: Unequal leg length  Sacrococcygeal disorders, not elsewhere classified  Other abnormalities of gait and mobility     Problem List Patient Active Problem List   Diagnosis Date Noted  . Fatty liver 03/13/2021  . Umbilical hernia 47/99/8001  . Stress incontinence 12/03/2020  . Altered bowel habits   . Tendinitis of upper biceps tendon of left shoulder 07/28/2019  . Anxiety and depression 07/03/2019  . Obstructive sleep apnea 07/03/2019  . Subjective pulsatile tinnitus of right ear 07/03/2019  . Nontraumatic incomplete tear of left rotator cuff 05/23/2019  . Rotator cuff tendinitis, left 05/23/2019  . Stress 12/22/2018  . Loose stools 12/22/2018  . Clinically isolated syndrome of brainstem ([place]) 12/22/2018  . Rotator cuff impingement syndrome of left shoulder 06/21/2018  . Prediabetes 06/23/2017  . Carpal tunnel syndrome 06/23/2017  . Vitamin D deficiency 06/23/2017  . Recurrent sinusitis 12/24/2016  . Multiple benign nevi 12/24/2016  . Allergic rhinitis 06/19/2016  . Obesity 06/19/2016  . Hypothyroidism  11/20/2015  . GERD (gastroesophageal reflux disease) 11/20/2015    Jerl Mina ,PT, DPT, E-RYT  03/21/2021, 5:12 PM  [place] MAIN [facility] SERVICES [address] [place], [place], [address] Phone: [phone]   Fax:  [phone]  Name: PIHU BASIL MRN: [id number] Date of Birth: [birth year]

## 2021-03-21 NOTE — Patient Instructions (Signed)
PELVIC FLOOR / KEGEL EXERCISES   Pelvic floor/ Kegel exercises are used to strengthen the muscles in the base of your pelvis that are responsible for supporting your pelvic organs and preventing urine/feces leakage. Based on your therapist's recommendations, they can be performed while standing, sitting, or lying down. Imagine pelvic floor area as a diamond with pelvic landmarks: top =pubic bone, bottom tip=tailbone, sides=sitting bones (ischial tuberosities).    Make yourself aware of this muscle group by using these cues while coordinating your breath:  Inhale, feel pelvic floor diamond area lower like hammock towards your feet and ribcage/belly expanding. Pause. Let the exhale naturally and feel your belly sink, abdominal muscles hugging in around you and you may notice the pelvic diamond draws upward towards your head forming a umbrella shape. Give a squeeze during the exhalation like you are stopping the flow of urine. If you are squeezing the buttock muscles, try to give 50% less effort.   Common Errors:  Breath holding: If you are holding your breath, you may be bearing down against your bladder instead of pulling it up. If you belly bulges up while you are squeezing, you are holding your breath. Be sure to breathe gently in and out while exercising. Counting out loud may help you avoid holding your breath.  Accessory muscle use: You should not see or feel other muscle movement when performing pelvic floor exercises. When done properly, no one can tell that you are performing the exercises. Keep the buttocks, belly and inner thighs relaxed.  Overdoing it: Your muscles can fatigue and stop working for you if you over-exercise. You may actually leak more or feel soreness at the lower abdomen or rectum.  YOUR HOME EXERCISE PROGRAM  LONG HOLDS:   Position: on back or reclined in car seat ( do not lift head to sit up, instead make sure to use the handle to raise the car seat up and keep  spine/head relaxed to not place load on pelvic floor/ abdominal muscle)     Inhale and then exhale. Then squeeze the muscle and count aloud for 3 seconds. Rest with three long breaths. (Be sure to let belly sink in with exhales and not push outward)  Perform 2 repetitions, 2 times/day                      DECREASE DOWNWARD PRESSURE ON  YOUR PELVIC FLOOR, ABDOMINAL, LOW BACK MUSCLES       PRESERVE YOUR PELVIC HEALTH LONG-TERM   ** SQUEEZE pelvic floor BEFORE YOUR SNEEZE, COUGH, LAUGH   ** EXHALE BEFORE YOU RISE AGAINST GRAVITY (lifting, sit to stand, from squat to stand)   ** LOG ROLL OUT OF BED INSTEAD OF CRUNCH/SIT-UP   __  Minisquat: Scoot buttocks back slight, hinge like you are looking at your reflection on a pond  Knees behind toes,  Inhale to "smell flowers"  Exhale on the rise "like rocket"  Do not lock knees, have more weight across ballmounds of feet, toes relaxed   10 reps x 3 x day

## 2021-03-26 ENCOUNTER — Other Ambulatory Visit: Payer: Self-pay

## 2021-03-26 ENCOUNTER — Ambulatory Visit: Payer: BC Managed Care – PPO | Admitting: Physical Therapy

## 2021-03-26 DIAGNOSIS — M2170 Unequal limb length (acquired), unspecified site: Secondary | ICD-10-CM | POA: Diagnosis not present

## 2021-03-26 DIAGNOSIS — R2689 Other abnormalities of gait and mobility: Secondary | ICD-10-CM | POA: Diagnosis not present

## 2021-03-26 DIAGNOSIS — M533 Sacrococcygeal disorders, not elsewhere classified: Secondary | ICD-10-CM

## 2021-03-26 NOTE — Therapy (Signed)
[place] MAIN [facility] SERVICES [address] [place], [place], [address] Phone: [phone]   Fax:  [phone]  Physical Therapy Treatment / progress note   Patient Details  Name: Stephanie Shields MRN: [id number] Date of Birth: 1971-05-17 Referring Provider (PT): Biagio Quint, MD   Encounter Date: 03/26/2021   PT End of Session - 03/26/21 1333    Visit Number 10    Date for PT Re-Evaluation 04/09/21    PT Start Time 1104    PT Stop Time 1158    PT Time Calculation (min) 54 min    Activity Tolerance Patient tolerated treatment well           Past Medical History:  Diagnosis Date  . Allergic rhinitis   . Anxiety   . AVM (arteriovenous malformation) brain    MRI'S SHOW CHRONIC HEMORRHAGE IN CEREBELLUM  . 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 Elevated cholesterol   . GERD (gastroesophageal reflux disease)   . History of blood transfusion   . Hypothyroidism   . Low blood pressure   . Pre-diabetes   . Urinary incontinence    After delivery of her recent baby    Past Surgical History:  Procedure Laterality Date  . BICEPT TENODESIS  07/28/2019   Procedure: MINI OPEN BICEPS TENODESIS;  Surgeon: Corky Mull, MD;  Location: [facility] ORS;  Service: Orthopedics;;  . BLADDER SURGERY     as a child  . COLONOSCOPY WITH PROPOFOL N/A 02/01/2020   Procedure: COLONOSCOPY WITH PROPOFOL;  Surgeon: Lin Landsman, MD;  Location: [facility] ENDOSCOPY;  Service: Gastroenterology;  Laterality: N/A;  . ESOPHAGOGASTRODUODENOSCOPY (EGD) WITH PROPOFOL N/A 02/01/2020   Procedure: ESOPHAGOGASTRODUODENOSCOPY (EGD) WITH PROPOFOL;  Surgeon: Lin Landsman, MD;  Location: [facility] ENDOSCOPY;  Service: Gastroenterology;  Laterality: N/A;  . IR ANGIO INTRA EXTRACRAN SEL COM CAROTID INNOMINATE BILAT MOD SED  08/12/2019  . IR ANGIO VERTEBRAL SEL VERTEBRAL BILAT MOD SED  08/12/2019  . IR ANGIOGRAM EXTREMITY LEFT   08/12/2019  . IR US GUIDE VASC ACCESS RIGHT  08/12/2019  . MOUTH SURGERY    . NASAL SEPTUM SURGERY    . REFRACTIVE SURGERY    . SHOULDER ARTHROSCOPY WITH SUBACROMIAL DECOMPRESSION, ROTATOR CUFF REPAIR AND BICEP TENDON REPAIR Left 07/28/2019   Procedure: SHOULDER ARTHROSCOPY WITH, DEBIRDEMENT, SUBACROMIAL DECOMPRESSION, MINI OPEN ROTATOR CUFF REPAIR;  Surgeon: Corky Mull, MD;  Location: [facility] ORS;  Service: Orthopedics;  Laterality: Left;  . TONSILLECTOMY      There were no vitals filed for this visit.   Subjective Assessment - 03/26/21 1114    Subjective Pt noticed she is focusing on the relaxation of pelvic floor.              [facility] PT Assessment - 03/26/21 1339      Squat   Comments medial arch collapse, genu valgus, hyperextension of knees in minisquat      Palpation   Palpation comment tightness of L leg                         [facility] Adult PT Treatment/Exercise - 03/26/21 1337      Therapeutic Activites    Other Therapeutic Activities discussed upright, loading positions with knee and feet and propioception      Neuro Re-ed    Neuro Re-ed Details  cued for alignment of knee and feet in mini squats and  thoracolumbar strengthening      Manual Therapy   Manual therapy comments STM at lateral leg L, adductor L to promotee hip ER/abd    Kinesiotex --   promote ER of knee                      PT Long Term Goals - 03/26/21 1336      PT LONG TERM GOAL #1   Title Pt will improve her FOTO score for foot pain from 42 pts to > 53 pts in order walk and play with kids  ( 02/27/21: 56 pts)    Time 8    Period Weeks    Status Achieved      PT LONG TERM GOAL #2   Title Pt will improve FOTO for urinary problem score from 48 pts to > 58 pts in order to improve pelvic function and QOL  ( 02/27/21:  54 pts)    Time 10    Period Weeks    Status Partially Met      PT LONG TERM GOAL #3   Title Pt will report noticing less L foot pain by 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    Time 8    Period Weeks    Status Not Met      PT LONG TERM GOAL #4   Title Pt will report not needing a urinary pad across one week  in order to practice improvde hygiene and have pelvic floor control    Time 6    Period Weeks    Status On-going      PT LONG TERM GOAL #5   Title Pt will demo equal pelvic girdle and shoulder height across 2 weeks with shoe lift in R shoe in order to minimzie foot pain and B hip pain and progress to deep core exercises for urinary Sx    Time 2    Status Achieved      PT LONG TERM GOAL #6   Title Pt will demo increased L  DF AROM ([facility]) from 10 deg to > 20 deg in order to promote improved gait, mobility in ankle. foot to minimize  plantar fasciitis and to stand for longer periods of time at work  ( 02/27/21: 15 deg)    Time 6    Period Weeks    Status Partially Met      PT LONG TERM GOAL #7   Title Pt will demo proper deep core coordination and progress with pelvic floor contractions 3 sec, 3 reps in order to minimize leakage    Time 8    Period Weeks    Status Achieved                 Plan - 03/26/21 1335    Clinical Impression Statement Pt has achieved 3/7 goals ans progressing well towards remaining goals. Pt's urinary leakage is improving. Pt has finally progressed to pelvic floor contractions after demonstrating more mobility to pelvic floor, perineal scars, and proper lengthening of pelvic floor. Pt has demo'd a levelled pelvic girdle with shoe lift in L shoe. Pt advanced today to minisquats and thoracolumbar strengthening with resistance bands. Pt required cues for proper technique and to optimize foot arch, DF/EV, hip abd/ER. Pt continues to benefit from skilled PT to meet remaining goals.   Examination-Activity Limitations Continence;Toileting;Stand    Stability/Clinical Decision  Making Evolving/Moderate complexity    Rehab Potential  Good    PT Frequency 1x / week    PT Duration Other (comment)   10   PT Treatment/Interventions Moist Heat;Stair training;Neuromuscular re-education;Therapeutic activities;Therapeutic exercise;Functional mobility training;Patient/family education;Manual techniques;Balance training;Energy conservation;Splinting;Manual lymph drainage;Taping;Scar mobilization;Joint Manipulations;Spinal Manipulations;Gait training;Cryotherapy;Dry needling    Consulted and Agree with Plan of Care Patient           Patient will benefit from skilled therapeutic intervention in order to improve the following deficits and impairments:  Increased muscle spasms,Decreased mobility,Decreased coordination,Abnormal gait,Improper body mechanics,Pain,Postural dysfunction,Decreased strength,Decreased range of motion,Decreased endurance,Decreased balance,Difficulty walking,Hypomobility,Increased fascial restricitons,Decreased scar mobility  Visit Diagnosis: Unequal leg length  Sacrococcygeal disorders, not elsewhere classified  Other abnormalities of gait and mobility     Problem List Patient Active Problem List   Diagnosis Date Noted  . Fatty liver 03/13/2021  . Umbilical hernia 38/82/6666  . Stress incontinence 12/03/2020  . Altered bowel habits   . Tendinitis of upper biceps tendon of left shoulder 07/28/2019  . Anxiety and depression 07/03/2019  . Obstructive sleep apnea 07/03/2019  . Subjective pulsatile tinnitus of right ear 07/03/2019  . Nontraumatic incomplete tear of left rotator cuff 05/23/2019  . Rotator cuff tendinitis, left 05/23/2019  . Stress 12/22/2018  . Loose stools 12/22/2018  . Clinically isolated syndrome of brainstem ([place]) 12/22/2018  . Rotator cuff impingement syndrome of left shoulder 06/21/2018  . Prediabetes 06/23/2017  . Carpal tunnel syndrome 06/23/2017  . Vitamin D deficiency 06/23/2017  . Recurrent sinusitis 12/24/2016  . Multiple benign nevi 12/24/2016  . Allergic rhinitis  06/19/2016  . Obesity 06/19/2016  . Hypothyroidism 11/20/2015  . GERD (gastroesophageal reflux disease) 11/20/2015    Jerl Mina ,PT, DPT, E-RYT  03/26/2021, 1:45 PM  [place] MAIN [facility] SERVICES [address] [place], [place], [address] Phone: [phone]   Fax:  [phone]  Name: Stephanie Shields MRN: [id number] Date of Birth: 07/03/1971

## 2021-03-26 NOTE — Patient Instructions (Signed)
Minisquats:  more pinky side  arch lifted   Knee out  not locking knee   Blue band with bicep curls  20 reps  __  Hanging over door   "w" arms   20 reps   ---  zig zag walking with band at thigh  20 reps   ___

## 2021-04-01 ENCOUNTER — Ambulatory Visit: Payer: BC Managed Care – PPO | Attending: Family Medicine | Admitting: Physical Therapy

## 2021-04-01 ENCOUNTER — Other Ambulatory Visit: Payer: Self-pay

## 2021-04-01 DIAGNOSIS — M2170 Unequal limb length (acquired), unspecified site: Secondary | ICD-10-CM | POA: Diagnosis not present

## 2021-04-01 DIAGNOSIS — R2689 Other abnormalities of gait and mobility: Secondary | ICD-10-CM | POA: Diagnosis not present

## 2021-04-01 DIAGNOSIS — M533 Sacrococcygeal disorders, not elsewhere classified: Secondary | ICD-10-CM | POA: Diagnosis not present

## 2021-04-01 NOTE — Therapy (Addendum)
[place] MAIN [facility] SERVICES [address] [place], [place], [address] Phone: [phone]   Fax:  [phone]  Physical Therapy Treatment / Discharge Summary   Patient Details  Name: Stephanie Shields MRN: [id number] Date of Birth: 11-30-1971 Referring Provider (PT): Biagio Quint, MD   Encounter Date: 04/01/2021   PT End of Session - 04/01/21 1017    Visit Number 11    Date for PT Re-Evaluation 04/09/21    PT Start Time 1009    PT Stop Time 1100    PT Time Calculation (min) 51 min    Activity Tolerance Patient tolerated treatment well           Past Medical History:  Diagnosis Date  . Allergic rhinitis   . Anxiety   . AVM (arteriovenous malformation) brain    MRI'S SHOW CHRONIC HEMORRHAGE IN CEREBELLUM  . 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 Elevated cholesterol   . GERD (gastroesophageal reflux disease)   . History of blood transfusion   . Hypothyroidism   . Low blood pressure   . Pre-diabetes   . Urinary incontinence    After delivery of her recent baby    Past Surgical History:  Procedure Laterality Date  . BICEPT TENODESIS  07/28/2019   Procedure: MINI OPEN BICEPS TENODESIS;  Surgeon: Corky Mull, MD;  Location: [facility] ORS;  Service: Orthopedics;;  . BLADDER SURGERY     as a child  . COLONOSCOPY WITH PROPOFOL N/A 02/01/2020   Procedure: COLONOSCOPY WITH PROPOFOL;  Surgeon: Lin Landsman, MD;  Location: [facility] ENDOSCOPY;  Service: Gastroenterology;  Laterality: N/A;  . ESOPHAGOGASTRODUODENOSCOPY (EGD) WITH PROPOFOL N/A 02/01/2020   Procedure: ESOPHAGOGASTRODUODENOSCOPY (EGD) WITH PROPOFOL;  Surgeon: Lin Landsman, MD;  Location: [facility] ENDOSCOPY;  Service: Gastroenterology;  Laterality: N/A;  . IR ANGIO INTRA EXTRACRAN SEL COM CAROTID INNOMINATE BILAT MOD SED  08/12/2019  . IR ANGIO VERTEBRAL SEL VERTEBRAL BILAT MOD SED  08/12/2019  . IR ANGIOGRAM EXTREMITY LEFT   08/12/2019  . IR US GUIDE VASC ACCESS RIGHT  08/12/2019  . MOUTH SURGERY    . NASAL SEPTUM SURGERY    . REFRACTIVE SURGERY    . SHOULDER ARTHROSCOPY WITH SUBACROMIAL DECOMPRESSION, ROTATOR CUFF REPAIR AND BICEP TENDON REPAIR Left 07/28/2019   Procedure: SHOULDER ARTHROSCOPY WITH, DEBIRDEMENT, SUBACROMIAL DECOMPRESSION, MINI OPEN ROTATOR CUFF REPAIR;  Surgeon: Corky Mull, MD;  Location: [facility] ORS;  Service: Orthopedics;  Laterality: Left;  . TONSILLECTOMY      There were no vitals filed for this visit.   Subjective Assessment - 04/01/21 1332    Subjective Pt noticed less leakage but there was one epsiode of leakage recently.              [facility] PT Assessment - 04/01/21 1313      Ambulation/Gait   Gait Comments more mobility hip, more anterior COM                      Pelvic Floor Special Questions - 04/01/21 1018    Pelvic Floor Internal Exam pt consented and had no contraindications    Exam Type Vaginal    Palpation tightness at perineal scar 5-8 o' clock,    Strength fair squeeze, definite lift    Strength # of reps 2    Strength # of seconds 3           Treatment:  Manual  Tx: scar mobilization internal pelvic floor  moist heat over posterior perineum in prone 5'  Neuromuscular reedu: cued for stabilization of feet with pelvic floor tilts to minimize back spasms                  PT Long Term Goals - 04/01/21 1057      PT LONG TERM GOAL #1   Title Pt will improve her FOTO score for foot pain from 42 pts to > 53 pts in order walk and play with kids  ( 02/27/21: 56 pts, 04/01/21: 67pts)    Time 8    Period Weeks    Status Achieved      PT LONG TERM GOAL #2   Title Pt will improve FOTO for urinary problem score from 48 pts to > 58 pts in order to improve pelvic function and QOL  ( 02/27/21:  54 pts, 04/01/21: 56pts) )    Time 10    Period Weeks    Status Achieved      PT LONG TERM GOAL #3   Title Pt will report noticing less L foot pain by  50% 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    Time 8    Period Weeks    Status Not Met      PT LONG TERM GOAL #4   Title Pt will report not needing a urinary pad across one week  in order to practice improvde hygiene and have pelvic floor control ( 5/2: pt went without a pad for one day)    Time 6    Period Weeks    Status Partially Met      PT LONG TERM GOAL #5   Title Pt will demo equal pelvic girdle and shoulder height across 2 weeks with shoe lift in R shoe in order to minimzie foot pain and B hip pain and progress to deep core exercises for urinary Sx    Time 2    Status Achieved      PT LONG TERM GOAL #6   Title Pt will demo increased L  DF AROM ([facility]) from 10 deg to > 20 deg in order to promote improved gait, mobility in ankle. foot to minimize  plantar fasciitis and to stand for longer periods of time at work  ( 02/27/21: 15 deg)    Time 6    Period Weeks    Status Partially Met      PT LONG TERM GOAL #7   Title Pt will demo proper deep core coordination and progress with pelvic floor contractions 3 sec, 3 reps in order to minimize leakage    Time 8    Period Weeks    Status Achieved                 Plan - 04/01/21 1017    Clinical Impression Statement Pt has achieved 4/7 goals and improved significantly  her FOTO scores for foot pain and urinary problems.   Pt demo'd improved deep core coordination and progressed to pelvic floor contractions which has helped with her incontinence.  Pt's gait pattern has improved with more co-activation of deep core muscles, less hyperextension of knees, and more instrinsic feet mm activation.  Pt's shorter leg length has been addressed along with tight pelvic floor mm, hypomobility of hip and SIJ and spine.   Pt will be undergoing foot surgery this month  which will help improve remaining foot pain. Pt has been recommended to have PT pre and post surgery. Pt is  scheduled at [facility].   Pt is IND with HEP and ready for d/c at this time.     Examination-Activity Limitations Continence;Toileting;Stand    Stability/Clinical Decision Making Evolving/Moderate complexity    Rehab Potential Good    PT Frequency 1x / week    PT Duration Other (comment)   10   PT Treatment/Interventions Moist Heat;Stair training;Neuromuscular re-education;Therapeutic activities;Therapeutic exercise;Functional mobility training;Patient/family education;Manual techniques;Balance training;Energy conservation;Splinting;Manual lymph drainage;Taping;Scar mobilization;Joint Manipulations;Spinal Manipulations;Gait training;Cryotherapy;Dry needling    Consulted and Agree with Plan of Care Patient           Patient will benefit from skilled therapeutic intervention in order to improve the following deficits and impairments:  Increased muscle spasms,Decreased mobility,Decreased coordination,Abnormal gait,Improper body mechanics,Pain,Postural dysfunction,Decreased strength,Decreased range of motion,Decreased endurance,Decreased balance,Difficulty walking,Hypomobility,Increased fascial restricitons,Decreased scar mobility  Visit Diagnosis: Unequal leg length  Sacrococcygeal disorders, not elsewhere classified  Other abnormalities of gait and mobility     Problem List Patient Active Problem List   Diagnosis Date Noted  . Fatty liver 03/13/2021  . Umbilical hernia 54/36/0677  . Stress incontinence 12/03/2020  . Altered bowel habits   . Tendinitis of upper biceps tendon of left shoulder 07/28/2019  . Anxiety and depression 07/03/2019  . Obstructive sleep apnea 07/03/2019  . Subjective pulsatile tinnitus of right ear 07/03/2019  . Nontraumatic incomplete tear of left rotator cuff 05/23/2019  . Rotator cuff tendinitis, left 05/23/2019  . Stress 12/22/2018  . Loose stools 12/22/2018  . Clinically isolated syndrome of brainstem ([place]) 12/22/2018  . Rotator cuff  impingement syndrome of left shoulder 06/21/2018  . Prediabetes 06/23/2017  . Carpal tunnel syndrome 06/23/2017  . Vitamin D deficiency 06/23/2017  . Recurrent sinusitis 12/24/2016  . Multiple benign nevi 12/24/2016  . Allergic rhinitis 06/19/2016  . Obesity 06/19/2016  . Hypothyroidism 11/20/2015  . GERD (gastroesophageal reflux disease) 11/20/2015    Jerl Mina ,PT, DPT, E-RYT  04/01/2021, 1:33 PM  [place] MAIN [facility] SERVICES [address] [place], [place], [address] Phone: [phone]   Fax:  [phone]  Name: Stephanie Shields MRN: [id number] Date of Birth: 03/22/1971

## 2021-04-02 DIAGNOSIS — K429 Umbilical hernia without obstruction or gangrene: Secondary | ICD-10-CM | POA: Diagnosis not present

## 2021-04-11 ENCOUNTER — Ambulatory Visit (INDEPENDENT_AMBULATORY_CARE_PROVIDER_SITE_OTHER): Payer: BC Managed Care – PPO | Admitting: Psychology

## 2021-04-11 DIAGNOSIS — F330 Major depressive disorder, recurrent, mild: Secondary | ICD-10-CM

## 2021-04-11 DIAGNOSIS — F4310 Post-traumatic stress disorder, unspecified: Secondary | ICD-10-CM

## 2021-04-17 ENCOUNTER — Ambulatory Visit (INDEPENDENT_AMBULATORY_CARE_PROVIDER_SITE_OTHER): Payer: BC Managed Care – PPO | Admitting: Psychology

## 2021-04-17 DIAGNOSIS — F4310 Post-traumatic stress disorder, unspecified: Secondary | ICD-10-CM | POA: Diagnosis not present

## 2021-04-17 DIAGNOSIS — F4323 Adjustment disorder with mixed anxiety and depressed mood: Secondary | ICD-10-CM

## 2021-04-17 DIAGNOSIS — F909 Attention-deficit hyperactivity disorder, unspecified type: Secondary | ICD-10-CM | POA: Diagnosis not present

## 2021-04-18 ENCOUNTER — Ambulatory Visit: Payer: BC Managed Care – PPO

## 2021-04-23 ENCOUNTER — Ambulatory Visit: Payer: BC Managed Care – PPO

## 2021-04-25 ENCOUNTER — Ambulatory Visit: Payer: BC Managed Care – PPO

## 2021-04-30 ENCOUNTER — Ambulatory Visit: Payer: BC Managed Care – PPO

## 2021-05-03 DIAGNOSIS — M722 Plantar fascial fibromatosis: Secondary | ICD-10-CM | POA: Diagnosis not present

## 2021-05-06 ENCOUNTER — Ambulatory Visit (INDEPENDENT_AMBULATORY_CARE_PROVIDER_SITE_OTHER): Payer: BC Managed Care – PPO | Admitting: Psychology

## 2021-05-06 DIAGNOSIS — F4310 Post-traumatic stress disorder, unspecified: Secondary | ICD-10-CM

## 2021-05-06 DIAGNOSIS — F4323 Adjustment disorder with mixed anxiety and depressed mood: Secondary | ICD-10-CM

## 2021-05-07 ENCOUNTER — Ambulatory Visit: Payer: BC Managed Care – PPO | Attending: Family Medicine

## 2021-05-07 ENCOUNTER — Other Ambulatory Visit: Payer: Self-pay

## 2021-05-07 DIAGNOSIS — M25675 Stiffness of left foot, not elsewhere classified: Secondary | ICD-10-CM | POA: Diagnosis not present

## 2021-05-07 DIAGNOSIS — M25572 Pain in left ankle and joints of left foot: Secondary | ICD-10-CM | POA: Diagnosis not present

## 2021-05-07 DIAGNOSIS — M2170 Unequal limb length (acquired), unspecified site: Secondary | ICD-10-CM | POA: Insufficient documentation

## 2021-05-07 DIAGNOSIS — M533 Sacrococcygeal disorders, not elsewhere classified: Secondary | ICD-10-CM | POA: Diagnosis not present

## 2021-05-07 DIAGNOSIS — R2689 Other abnormalities of gait and mobility: Secondary | ICD-10-CM | POA: Insufficient documentation

## 2021-05-07 NOTE — Therapy (Signed)
[place] [facility] [address] [place], [address] Phone: [phone]   Fax:  [phone]  Physical Therapy Evaluation  Patient Details  Name: Stephanie Shields MRN: [id number] Date of Birth: Apr 22, 1971 Referring Provider (PT): Stacey Drain   Encounter Date: 05/07/2021   PT End of Session - 05/07/21 1333    Visit Number 1    Number of Visits 17    Date for PT Re-Evaluation 07/02/21    PT Start Time 1117    PT Stop Time 1202    PT Time Calculation (min) 45 min    Activity Tolerance Patient tolerated treatment well;No increased pain    Behavior During Therapy WFL for tasks assessed/performed           Past Medical History:  Diagnosis Date  . Allergic rhinitis   . Anxiety   . AVM (arteriovenous malformation) brain    MRI'S SHOW CHRONIC HEMORRHAGE IN CEREBELLUM  . 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 Elevated cholesterol   . GERD (gastroesophageal reflux disease)   . History of blood transfusion   . Hypothyroidism   . Low blood pressure   . Pre-diabetes   . Urinary incontinence    After delivery of her recent baby    Past Surgical History:  Procedure Laterality Date  . BICEPT TENODESIS  07/28/2019   Procedure: MINI OPEN BICEPS TENODESIS;  Surgeon: Corky Mull, MD;  Location: [facility] ORS;  Service: Orthopedics;;  . BLADDER SURGERY     as a child  . COLONOSCOPY WITH PROPOFOL N/A 02/01/2020   Procedure: COLONOSCOPY WITH PROPOFOL;  Surgeon: Lin Landsman, MD;  Location: [facility] ENDOSCOPY;  Service: Gastroenterology;  Laterality: N/A;  . ESOPHAGOGASTRODUODENOSCOPY (EGD) WITH PROPOFOL N/A 02/01/2020   Procedure: ESOPHAGOGASTRODUODENOSCOPY (EGD) WITH PROPOFOL;  Surgeon: Lin Landsman, MD;  Location: [facility] ENDOSCOPY;  Service: Gastroenterology;  Laterality: N/A;  . IR ANGIO INTRA EXTRACRAN SEL COM CAROTID INNOMINATE BILAT MOD SED  08/12/2019  .  IR ANGIO VERTEBRAL SEL VERTEBRAL BILAT MOD SED  08/12/2019  . IR ANGIOGRAM EXTREMITY LEFT  08/12/2019  . IR US GUIDE VASC ACCESS RIGHT  08/12/2019  . MOUTH SURGERY    . NASAL SEPTUM SURGERY    . REFRACTIVE SURGERY    . SHOULDER ARTHROSCOPY WITH SUBACROMIAL DECOMPRESSION, ROTATOR CUFF REPAIR AND BICEP TENDON REPAIR Left 07/28/2019   Procedure: SHOULDER ARTHROSCOPY WITH, DEBIRDEMENT, SUBACROMIAL DECOMPRESSION, MINI OPEN ROTATOR CUFF REPAIR;  Surgeon: Corky Mull, MD;  Location: [facility] ORS;  Service: Orthopedics;  Laterality: Left;  . TONSILLECTOMY      There were no vitals filed for this visit.    Subjective Assessment - 05/07/21 1124    Subjective Pt is a 50 y.o. female referred to PT for L foot plantar fasciitis s/p Tenex procedure on 6/3.    Pertinent History Pt is a 50 y.o. female referred to PT for L foot plantar fasciitis s/p Tenex procedure on 6/3. Per MD note pt is to use boot and crutches and attached to order is Tenex protocol. Pt has PMH that includes: Anxiety, AVM, high cholesterol. GERD, hypothyroidism. Pt reports battling L foot plantar fasciitis for ~ 20 years and has tried multiple conservative management for it with no relief. Post procedure pt has had no pain. Pt reports MD cleared her for WBAT with crutches. Pt reports able to reduce from B crutch use to single crutch usetomorrow and able to  fully weightbear on Friday 1 week post op. Pt's big goal is being pain free after healing of her procedure, able to walk long distances, exercises, stand on feet for longer. Worst pain prior to operation 5-8/10 NPS.    Limitations House hold activities;Walking;Standing    How long can you stand comfortably? 1 hour    How long can you walk comfortably? up to 5/10 NPS before needing to have a seat.    Currently in Pain? No/denies    Pain Score 0-No pain    Pain Location --    Pain Orientation --              [facility] PT Assessment - 05/07/21 1130      Assessment   Medical Diagnosis Tenex  operation for L foot plantar fasciitis    Referring Provider (PT) Stacey Drain    Onset Date/Surgical Date 05/03/21    Next MD Visit 05/23/21    Prior Therapy Yes      Restrictions   Weight Bearing Restrictions Yes    LLE Weight Bearing Partial weight bearing      Balance Screen   Has the patient fallen in the past 6 months No      Prior Function   Level of Independence Independent    Vocation Full time employment    Leisure Play with kids, arts and crafts, walking      Cognition   Overall Cognitive Status Within Functional Limits for tasks assessed            PAIN: 0/10 NPS  POSTURE: Slight forward head posture and rounded shoulders.    STRENGTH:  Graded on a 0-5 scale Muscle Group Left Right  Shoulder flex /5 /5  Shoulder Abd /5 /5  Shoulder Ext /5 /5  Shoulder IR/ER /5 /5  Elbow /5 /5  Wrist/hand /5 /5  Hip Flex 5/5 5/5  Hip Abd 4+/5 4+/5  Hip Add 5/5 5/5  Hip Ext 4+/5 4+/5  Hip IR/ER 4+/5 4+/5  Knee Flex 5/5 5/5  Knee Ext 5/5 5/5  Ankle DF 5/5 NT  Ankle PF 5/5 NT   SENSATION: BLE : Normal in BLE's   R foot AROM. L foot deferred per MD protocol Great toe extension: 46/  Great toe flexion: 50/  Ankle DF: 15/ Ankle PF: 40/ Ankle inversion: 44/ Ankle eversion: 30/  R foot PROM. L foot PROM next session per MD protocol  Great toe extension: 60/  Great toe flexion: 60/  Ankle DF: 16/ Ankle PF: 46/  Ankle inversion:50/ Ankle eversion: 30/   FUNCTIONAL MOBILITY: Sit to stand: safe STS technique with use of B crutches.   BALANCE: Static Sitting Balance  Normal Able to maintain balance against maximal resistance   Good Able to maintain balance against moderate resistance X  Good-/Fair+ Accepts minimal resistance   Fair Able to sit unsupported without balance loss and without UE support   Poor+ Able to maintain with Minimal assistance from individual or chair   Poor Unable to maintain balance-requires mod/max support from individual or chair     Static Standing Balance  Normal Able to maintain standing balance against maximal resistance   Good Able to maintain standing balance against moderate resistance   Good-/Fair+ Able to maintain standing balance against minimal resistance X  Fair Able to stand unsupported without UE support and without LOB for 1-2 min   Fair- Requires Min A and UE support to maintain standing without loss of balance   Poor+  Requires mod A and UE support to maintain standing without loss of balance   Poor Requires max A and UE support to maintain standing balance without loss    Dynamic Sitting Balance  Normal Able to sit unsupported and weight shift across midline maximally X  Good Able to sit unsupported and weight shift across midline moderately   Good-/Fair+ Able to sit unsupported and weight shift across midline minimally   Fair Minimal weight shifting ipsilateral/front, difficulty crossing midline   Fair- Reach to ipsilateral side and unable to weight shift   Poor + Able to sit unsupported with min A and reach to ipsilateral side, unable to weight shift   Poor Able to sit unsupported with mod A and reach ipsilateral/front-can't cross midline    Standing Dynamic Balance  Normal Stand independently unsupported, able to weight shift and cross midline maximally X  Good Stand independently unsupported, able to weight shift and cross midline moderately   Good-/Fair+ Stand independently unsupported, able to weight shift across midline minimally   Fair Stand independently unsupported, weight shift, and reach ipsilaterally, loss of balance when crossing midline   Poor+ Able to stand with Min A and reach ipsilaterally, unable to weight shift   Poor Able to stand with Mod A and minimally reach ipsilaterally, unable to cross midline.      GAIT: Pt ambulates with WBAT on LLE and step-to 3 point gait pattern with B crutches.   OUTCOME MEASURES: TEST Outcome Interpretation  FOTO 49/70                          Pt educated on open chain hip/knee exercises pt can initiate per MD protocol. Given handout. Pt displays understanding and independence with all given exercises.          Objective measurements completed on examination: See above findings.               PT Education - 05/07/21 1332    Education Details POC. Form/technique with open chain hip exercises.    Person(s) Educated Patient    Methods Explanation;Demonstration;Tactile cues;Verbal cues;Handout    Comprehension Verbalized understanding;Returned demonstration            PT Short Term Goals - 05/07/21 2000      PT SHORT TERM GOAL #1   Title Pt will be independent with HEP to improve strength and mobility of BLE's for functional mobility.    Baseline 6/7: initiated    Time 4    Period Weeks    Status New    Target Date 06/04/21      PT SHORT TERM GOAL #2   Title Pt will have L ankle PROM equal to R ankle PROM to display appropriate joint motion for standing and walking tasks    Baseline 6/7: PROM on R foot measured. see objective for numbers. Measure L foot after 1 week s/p post op date (6/3)    Time 4    Period Weeks    Status New    Target Date 06/04/21             PT Long Term Goals - 05/07/21 2002      PT LONG TERM GOAL #1   Title Pt will improve FOTO to target score to display improvements in functional mobility.    Baseline 6/7: 54/70    Time 8    Period Weeks    Status New    Target Date 07/02/21  PT LONG TERM GOAL #2   Title Pt will improve L ankle AROM equal to R ankle to display adequate motion to perform ambulatory tasks required for home, work, community ADL's.    Baseline 6/7: See AROM of R ankle in objective.    Time 8    Period Weeks    Status New    Target Date 07/02/21      PT LONG TERM GOAL #3   Title Pt will report ability to walk > 1 mile with < 2/10 pain NPS to display improvements in leisure walking activities.    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    Baseline 6/7: can only stand for 1 hour    Time 8    Period Weeks    Status New    Target Date 07/02/21             [facility] PT Assessment - 05/07/21 1130      Assessment   Medical Diagnosis Tenex operation for L foot plantar fasciitis    Referring Provider (PT) Stacey Drain    Onset Date/Surgical Date 05/03/21    Next MD Visit 05/23/21    Prior Therapy Yes      Restrictions   Weight Bearing Restrictions Yes    LLE Weight Bearing Partial weight bearing      Balance Screen   Has the patient fallen in the past 6 months No      Prior Function   Level of Independence Independent    Vocation Full time employment    Leisure Play with kids, arts and crafts, walking      Cognition   Overall Cognitive Status Within Functional Limits for tasks assessed                 Plan - 05/07/21 1335    Clinical Impression Statement Pt is a pleasant 50 y.o. female presenting to OP PT for S/P L foot plantar faciitis Tenex procedure. Per MD protocol pt is in healing phase of L foot for 1 week and can begin PROM starting 1 week post op. PT recorded objective AROM/PROM of R foot and will compare R foot PROM to L foot PROM measures next session. Pt displays full sensation and strength in BLE's and LLE from hip to knee. Ankle strength on LLE not tested due to acute post op. Pt educated on open chain hip and knee exercises and given hand out. Pt displays safe transfers and ambulation with crutches and L walking boot. 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 Pt will benefit from skilled PT services to restore L ankle and foot mobility, strength, and ability to return to PLOF with  teaching, walking. and leisure activities so pt can return to PLOF.    Personal Factors and Comorbidities Age;Comorbidity 3+;Fitness;Past/Current Experience;Profession;Time since onset of injury/illness/exacerbation    Comorbidities Anxiety, AVM, high cholesterol. GERD, hypothyroidism.    Examination-Activity Limitations Squat;Lift;Stairs;Locomotion Level;Stand    Examination-Participation Restrictions Shop;Community Activity;Occupation    Stability/Clinical Decision Making Stable/Uncomplicated    Clinical Decision Making Low  Rehab Potential Good    PT Frequency 2x / week    PT Duration 8 weeks    PT Treatment/Interventions ADLs/Self Care Home Management;Aquatic Therapy;Cryotherapy;Electrical Stimulation;Moist Heat;DME Instruction;Contrast Bath;Gait training;Stair training;Functional mobility training;Therapeutic activities;Therapeutic exercise;Balance training;Neuromuscular re-education;Patient/family education;Manual techniques;Passive range of motion;Dry needling    PT Next Visit Plan Measure PROM of L foot. Follow week 2 of protocol.    PT Home Exercise Plan Open chain hip exercises: marching (seated), SLR, LAQ's, clamshells, hip abduction, prone hip extension.    Consulted and Agree with Plan of Care Patient           Patient will benefit from skilled therapeutic intervention in order to improve the following deficits and impairments:  Decreased mobility,Decreased activity tolerance,Decreased range of motion,Decreased strength,Difficulty walking,Impaired flexibility  Visit Diagnosis: Stiffness of left foot, not elsewhere classified     Problem List Patient Active Problem List   Diagnosis Date Noted  . Fatty liver 03/13/2021  . Umbilical hernia 14/48/1856  . Stress incontinence 12/03/2020  . Altered bowel habits   . Tendinitis of upper biceps tendon of left shoulder 07/28/2019  . Anxiety and depression 07/03/2019  . Obstructive sleep apnea 07/03/2019  . Subjective  pulsatile tinnitus of right ear 07/03/2019  . Nontraumatic incomplete tear of left rotator cuff 05/23/2019  . Rotator cuff tendinitis, left 05/23/2019  . Stress 12/22/2018  . Loose stools 12/22/2018  . Clinically isolated syndrome of brainstem ([place]) 12/22/2018  . Rotator cuff impingement syndrome of left shoulder 06/21/2018  . Prediabetes 06/23/2017  . Carpal tunnel syndrome 06/23/2017  . Vitamin D deficiency 06/23/2017  . Recurrent sinusitis 12/24/2016  . Multiple benign nevi 12/24/2016  . Allergic rhinitis 06/19/2016  . Obesity 06/19/2016  . Hypothyroidism 11/20/2015  . GERD (gastroesophageal reflux disease) 11/20/2015    Salem Caster. Fairly IV, PT, DPT Physical Therapist- [facility]  05/07/2021, 8:16 PM  [place] [facility] [address] [place], [address] Phone: [phone]   Fax:  [phone]  Name: Stephanie Shields MRN: [id number] Date of Birth: 1971/03/30

## 2021-05-09 ENCOUNTER — Ambulatory Visit: Payer: BC Managed Care – PPO

## 2021-05-13 ENCOUNTER — Encounter: Payer: Self-pay | Admitting: Physical Therapy

## 2021-05-13 ENCOUNTER — Other Ambulatory Visit: Payer: Self-pay

## 2021-05-13 ENCOUNTER — Ambulatory Visit: Payer: BC Managed Care – PPO | Admitting: Physical Therapy

## 2021-05-13 DIAGNOSIS — M533 Sacrococcygeal disorders, not elsewhere classified: Secondary | ICD-10-CM | POA: Diagnosis not present

## 2021-05-13 DIAGNOSIS — M25675 Stiffness of left foot, not elsewhere classified: Secondary | ICD-10-CM

## 2021-05-13 DIAGNOSIS — M25572 Pain in left ankle and joints of left foot: Secondary | ICD-10-CM | POA: Diagnosis not present

## 2021-05-13 DIAGNOSIS — M2170 Unequal limb length (acquired), unspecified site: Secondary | ICD-10-CM | POA: Diagnosis not present

## 2021-05-13 DIAGNOSIS — R2689 Other abnormalities of gait and mobility: Secondary | ICD-10-CM | POA: Diagnosis not present

## 2021-05-13 NOTE — Therapy (Signed)
[facility] [address] [place], [address] Phone: [phone]   Fax:  [phone]  Physical Therapy Treatment  Patient Details  Name: Stephanie Shields MRN: [id number] Date of Birth: 06-21-1971 Referring Provider (PT): Stacey Drain   Encounter Date: 05/13/2021   PT End of Session - 05/13/21 1551     Visit Number 2    Number of Visits 17    Date for PT Re-Evaluation 07/02/21    Activity Tolerance Patient tolerated treatment well;No increased pain    Behavior During Therapy WFL for tasks assessed/performed             Past Medical History:  Diagnosis Date   Allergic rhinitis    Anxiety    AVM (arteriovenous malformation) brain    MRI'S SHOW CHRONIC HEMORRHAGE IN CEREBELLUM   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   Plaques on brainstem, followed by neurology   Elevated cholesterol    GERD (gastroesophageal reflux disease)    History of blood transfusion    Hypothyroidism    Low blood pressure    Pre-diabetes    Urinary incontinence    After delivery of her recent baby    Past Surgical History:  Procedure Laterality Date   BICEPT TENODESIS  07/28/2019   Procedure: MINI OPEN BICEPS TENODESIS;  Surgeon: Corky Mull, MD;  Location: [facility] ORS;  Service: Orthopedics;;   BLADDER SURGERY     as a child   COLONOSCOPY WITH PROPOFOL N/A 02/01/2020   Procedure: COLONOSCOPY WITH PROPOFOL;  Surgeon: Lin Landsman, MD;  Location: [facility] ENDOSCOPY;  Service: Gastroenterology;  Laterality: N/A;   ESOPHAGOGASTRODUODENOSCOPY (EGD) WITH PROPOFOL N/A 02/01/2020   Procedure: ESOPHAGOGASTRODUODENOSCOPY (EGD) WITH PROPOFOL;  Surgeon: Lin Landsman, MD;  Location: [facility] END[facility]COPY;  Service: Gastroenterology;  Laterality: N/A;   IR ANGIO INTRA EXTRACRAN SEL COM CAROTID INNOMINATE BILAT MOD SED  08/12/2019   IR ANGIO VERTEBRAL SEL VERTEBRAL BILAT MOD SED  08/12/2019   IR ANGIOGRAM EXTREMITY LEFT   08/12/2019   IR US GUIDE VASC ACCESS RIGHT  08/12/2019   MOUTH SURGERY     NASAL SEPTUM SURGERY     REFRACTIVE SURGERY     SHOULDER ARTHROSCOPY WITH SUBACROMIAL DECOMPRESSION, ROTATOR CUFF REPAIR AND BICEP TENDON REPAIR Left 07/28/2019   Procedure: SHOULDER ARTHROSCOPY WITH, DEBIRDEMENT, SUBACROMIAL DECOMPRESSION, MINI OPEN ROTATOR CUFF REPAIR;  Surgeon: Corky Mull, MD;  Location: [facility] ORS;  Service: Orthopedics;  Laterality: Left;   TONSILLECTOMY      There were no vitals filed for this visit.   Subjective Assessment - 05/13/21 1335     Subjective Pt has remained in boot, but she has continued to increase activity.    Pertinent History Pt is a 50 y.o. female referred to PT for L foot plantar fasciitis s/p Tenex procedure on 6/3. Per MD note pt is to use boot and crutches and attached to order is Tenex protocol. Pt has PMH that includes: Anxiety, AVM, high cholesterol. GERD, hypothyroidism. Pt reports battling L foot plantar fasciitis for ~ 20 years and has tried multiple conservative management for it with no relief. Post procedure pt has had no pain. Pt reports MD cleared her for WBAT with crutches. Pt reports able to reduce from B crutch use to single crutch usetomorrow and able to fully weightbear on Friday 1 week post op. Pt's big goal is being pain free after healing of her procedure, able to walk long distances, exercises, stand  on feet for longer. Worst pain prior to operation 5-8/10 NPS.    Limitations House hold activities;Walking;Standing    How long can you stand comfortably? 1 hour    How long can you walk comfortably? up to 5/10 NPS before needing to have a seat.    Currently in Pain? No/denies            Ankle AROM   Great toe extension: 46/ Not Tested  Great toe flexion: 50/ Not Tested  Ankle DF: 15/15 Ankle PF: 40/40 Ankle inversion: 44/30 Ankle eversion: 30/20  ANKLE PROM   Great toe extension: 60/ Not Tested  Great toe flexion: 60/Not Tested  Ankle DF:  16/15 Ankle PF: 46/40 Ankle inversion:50/30 Ankle eversion: 30/30   THEREX:   ABCs x 25   BAPS Level 4 -PF/DF 2 x 15  -Inv/Ev 2 x 15  -Counter clockwise  -Clockwise   Sitting Hamstring Stretch 2 x 30 sec  Prone quad stretch 2 x 30 sec  -Vcs and tcs on appropriate way to hold belt  ITB stretch 2 x 30 sec  -Vc to shift to edge of table     Plan - 05/13/21 1639     Clinical Impression Statement Pt is in 2nd week of postop protocol for Tenex procedure, and she has nearly symmetrical right and left ankle PROM and AROM. Pt able to perform all ankle exercises without an increase in pain. Pt instructed to follow-up with physician about when she is allowed to stop using boot. 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    Personal Factors and Comorbidities Age;Comorbidity 3+;Fitness;Past/Current Experience;Profession;Time since onset of injury/illness/exacerbation    Comorbidities Anxiety, AVM, high cholesterol. GERD, hypothyroidism.    Examination-Activity Limitations Squat;Lift;Stairs;Locomotion Level;Stand    Examination-Participation Restrictions Shop;Community Activity;Occupation    Stability/Clinical Decision Making Stable/Uncomplicated    Rehab Potential Good    PT Frequency 2x / week    PT Duration 8 weeks    PT Treatment/Interventions ADLs/Self Care Home Management;Aquatic Therapy;Cryotherapy;Electrical Stimulation;Moist Heat;DME Instruction;Contrast Bath;Gait training;Stair training;Functional mobility training;Therapeutic activities;Therapeutic exercise;Balance training;Neuromuscular re-education;Patient/family education;Manual techniques;Passive range of motion;Dry needling    PT Next Visit Plan Measure L AROM + PROM great toe flexion and extension. Review HEP exercises    PT Home Exercise Plan MEDBRIDGE [email or web address]    Consulted  and Agree with Plan of Care Patient                PT Short Term Goals - 05/13/21 1557       PT SHORT TERM GOAL #1   Title Pt will be independent with HEP to improve strength and mobility of BLE's for functional mobility.    Baseline 6/7: initiated    Time 4    Period Weeks    Status New    Target Date 06/04/21      PT SHORT TERM GOAL #2   Title Pt will have L ankle PROM equal to R ankle PROM to display appropriate joint motion for standing and walking tasks    Baseline 6/13: L foot PROM nearly symmetrical with R foot with exception of inversion.    Time 4    Period Weeks    Status Partially Met    Target Date 06/04/21               PT Long Term Goals -  05/13/21 1557       PT LONG TERM GOAL #1   Title Pt will improve FOTO to target score to display improvements in functional mobility.    Baseline 6/7: 54/70    Time 8    Period Weeks    Status New      PT LONG TERM GOAL #2   Title Pt will improve L ankle AROM equal to R ankle to display adequate motion to perform ambulatory tasks required for home, work, community ADL's.    Baseline 6/13: See AROM of L ankle in objective. Nearly symmetrical with exception of inversion    Time 8    Period Weeks    Status Partially Met      PT LONG TERM GOAL #3   Title Pt will report ability to walk > 1 mile with < 2/10 pain NPS to display improvements in leisure walking activities.    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    Baseline 6/7: can only stand for 1 hour    Time 8    Period Weeks    Status New               Patient will benefit from skilled therapeutic intervention in order to improve the following deficits and impairments:  Decreased mobility, Decreased activity tolerance, Decreased range of motion, Decreased strength, Difficulty walking,  Impaired flexibility  Visit Diagnosis: Stiffness of left foot, not elsewhere classified     Problem List Patient Active Problem List   Diagnosis Date Noted   Fatty liver 16/09/9603   Umbilical hernia 54/08/8118   Stress incontinence 12/03/2020   Altered bowel habits    Tendinitis of upper biceps tendon of left shoulder 07/28/2019   Anxiety and depression 07/03/2019   Obstructive sleep apnea 07/03/2019   Subjective pulsatile tinnitus of right ear 07/03/2019   Nontraumatic incomplete tear of left rotator cuff 05/23/2019   Rotator cuff tendinitis, left 05/23/2019   Stress 12/22/2018   Loose stools 12/22/2018   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 [place], [address] Phone: [phone]   Fax:  [phone]  Name: ANTIONETTE LUSTER MRN: [id number] Date of Birth: December 26, 1970

## 2021-05-16 ENCOUNTER — Other Ambulatory Visit: Payer: Self-pay

## 2021-05-16 ENCOUNTER — Ambulatory Visit: Payer: BC Managed Care – PPO | Admitting: Physical Therapy

## 2021-05-16 ENCOUNTER — Encounter: Payer: Self-pay | Admitting: Physical Therapy

## 2021-05-16 DIAGNOSIS — R2689 Other abnormalities of gait and mobility: Secondary | ICD-10-CM

## 2021-05-16 DIAGNOSIS — M25572 Pain in left ankle and joints of left foot: Secondary | ICD-10-CM | POA: Diagnosis not present

## 2021-05-16 DIAGNOSIS — M533 Sacrococcygeal disorders, not elsewhere classified: Secondary | ICD-10-CM | POA: Diagnosis not present

## 2021-05-16 DIAGNOSIS — M2170 Unequal limb length (acquired), unspecified site: Secondary | ICD-10-CM | POA: Diagnosis not present

## 2021-05-16 DIAGNOSIS — M25675 Stiffness of left foot, not elsewhere classified: Secondary | ICD-10-CM

## 2021-05-16 NOTE — Therapy (Signed)
[facility] [address], [place], [address] Phone: [phone]   Fax:  [phone]  Physical Therapy Treatment  Patient Details  Name: Stephanie Shields MRN: [id number] Date of Birth: 04-10-1971 Referring Provider (PT): Stacey Drain   Encounter Date: 05/16/2021   PT End of Session - 05/16/21 1426     Visit Number 3    Number of Visits 17    Date for PT Re-Evaluation 07/02/21    PT Start Time 4562    PT Stop Time 1455    PT Time Calculation (min) 40 min    Activity Tolerance Patient tolerated treatment well;No increased pain    Behavior During Therapy WFL for tasks assessed/performed             Past Medical History:  Diagnosis Date   Allergic rhinitis    Anxiety    AVM (arteriovenous malformation) brain    MRI'S SHOW CHRONIC HEMORRHAGE IN CEREBELLUM   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   Plaques on brainstem, followed by neurology   Elevated cholesterol    GERD (gastroesophageal reflux disease)    History of blood transfusion    Hypothyroidism    Low blood pressure    Pre-diabetes    Urinary incontinence    After delivery of her recent baby    Past Surgical History:  Procedure Laterality Date   BICEPT TENODESIS  07/28/2019   Procedure: MINI OPEN BICEPS TENODESIS;  Surgeon: Corky Mull, MD;  Location: [facility] ORS;  Service: Orthopedics;;   BLADDER SURGERY     as a child   COLONOSCOPY WITH PROPOFOL N/A 02/01/2020   Procedure: COLONOSCOPY WITH PROPOFOL;  Surgeon: Lin Landsman, MD;  Location: [facility] ENDOSCOPY;  Service: Gastroenterology;  Laterality: N/A;   ESOPHAGOGASTRODUODENOSCOPY (EGD) WITH PROPOFOL N/A 02/01/2020   Procedure: ESOPHAGOGASTRODUODENOSCOPY (EGD) WITH PROPOFOL;  Surgeon: Lin Landsman, MD;  Location: [facility] ENDOSCOPY;  Service: Gastroenterology;  Laterality: N/A;   IR ANGIO INTRA EXTRACRAN SEL COM CAROTID INNOMINATE BILAT MOD SED  08/12/2019   IR ANGIO  VERTEBRAL SEL VERTEBRAL BILAT MOD SED  08/12/2019   IR ANGIOGRAM EXTREMITY LEFT  08/12/2019   IR US GUIDE VASC ACCESS RIGHT  08/12/2019   MOUTH SURGERY     NASAL SEPTUM SURGERY     REFRACTIVE SURGERY     SHOULDER ARTHROSCOPY WITH SUBACROMIAL DECOMPRESSION, ROTATOR CUFF REPAIR AND BICEP TENDON REPAIR Left 07/28/2019   Procedure: SHOULDER ARTHROSCOPY WITH, DEBIRDEMENT, SUBACROMIAL DECOMPRESSION, MINI OPEN ROTATOR CUFF REPAIR;  Surgeon: Corky Mull, MD;  Location: [facility] ORS;  Service: Orthopedics;  Laterality: Left;   TONSILLECTOMY      There were no vitals filed for this visit.   Subjective Assessment - 05/16/21 1418     Subjective Pt reports discussing leaving boot between week 3 and 5 and that this could be decided by physical therapy. She reports some pain across arch after standing all day. She states she will be establishing care with new PT on July 6th.    Pertinent History Pt is a 50 y.o. female referred to PT for L foot plantar fasciitis s/p Tenex procedure on 6/3. Per MD note pt is to use boot and crutches and attached to order is Tenex protocol. Pt has PMH that includes: Anxiety, AVM, high cholesterol. GERD, hypothyroidism. Pt reports battling L foot plantar fasciitis for ~ 20 years and has tried multiple conservative management for it with no relief. Post procedure pt has had no  pain. Pt reports MD cleared her for WBAT with crutches. Pt reports able to reduce from B crutch use to single crutch usetomorrow and able to fully weightbear on Friday 1 week post op. Pt's big goal is being pain free after healing of her procedure, able to walk long distances, exercises, stand on feet for longer. Worst pain prior to operation 5-8/10 NPS.    Limitations House hold activities;Walking;Standing    How long can you stand comfortably? 1 hour    How long can you walk comfortably? up to 5/10 NPS before needing to have a seat.    Currently in Pain? No/denies             THEREX  Tilt Board PF/DF  pattern 1 x 25   Tilt Board Inv/Ev pattern 1 x 25   Ankle Circles 2 x 25   Straight Leg Raise 2 x 10 Sidelying Abduction 2 x 15                 PT Short Term Goals - 05/16/21 1430       PT SHORT TERM GOAL #1   Title Pt will be independent with HEP to improve strength and mobility of BLE's for functional mobility.    Baseline 6/7: initiated    Time 4    Period Weeks    Status New    Target Date 06/04/21      PT SHORT TERM GOAL #2   Title Pt will have L ankle PROM equal to R ankle PROM to display appropriate joint motion for standing and walking tasks    Baseline 6/13: L foot PROM nearly symmetrical with R foot with exception of inversion.    Time 4    Period Weeks    Status Partially Met    Target Date 06/04/21               PT Long Term Goals - 05/16/21 1429       PT LONG TERM GOAL #1   Title Pt will improve FOTO to target score to display improvements in functional mobility.    Baseline 6/7: 54/70    Time 8    Period Weeks    Status New      PT LONG TERM GOAL #2   Title Pt will improve L ankle AROM equal to R ankle to display adequate motion to perform ambulatory tasks required for home, work, community ADL's.    Baseline 6/13: See AROM of L ankle in objective. Nearly symmetrical with exception of inversion    Time 8    Period Weeks    Status Partially Met      PT LONG TERM GOAL #3   Title Pt will report ability to walk > 1 mile with < 2/10 pain NPS to display improvements in leisure walking activities.    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    Baseline 6/7: can only stand for 1 hour    Time 8    Period Weeks    Status New              Access Code: [email or web address] URL: [email or web address] Date: 05/16/2021 Prepared by: [place]  Quad - 1 x daily - 7 x weekly - 2 sets - 20 reps Seated Ankle Alphabet - 1 x daily - 7 x weekly - 1 sets - 5 reps Clamshell - 1 x daily - 7 x weekly - 2 sets - 20 reps Sidelying Hip Abduction - 1 x daily - 7 x weekly - 2 sets - 20 reps Supine Active Straight Leg Raise - 1 x daily - 7 x weekly - 2 sets - 20 reps Seated Hamstring Stretch - 1 x daily - 7 x weekly - 3 sets - 30 reps Prone Quadriceps Stretch with Strap - 1 x daily - 7 x weekly - 3 sets - 30 reps Half Kneeling Hip Flexor Stretch with Chair - 1 x daily - 7 x weekly - 3 sets - 30 reps Sidelying ITB Stretch off Table - 1 x daily - 7 x weekly - 3 sets - 30 reps Supine Bridge - 1 x daily - 7 x weekly - 3 sets - 10 reps      Plan - 05/16/21 1427     Clinical Impression Statement Pt continues to progress with postop protocol for Tenex procedure. She was able to tolereate all ankle mobility exercises and hip and knee strengthening exercises without experiencing an increase pain. Pt will continue to benefit from skilled PT to progress to week three of protocol next week with introduction of balance activities and further advancement of strengthening and flexibility for ankle to return to walking and standing activities.    Personal Factors and Comorbidities Age;Comorbidity 3+;Fitness;Past/Current Experience;Profession;Time since onset of injury/illness/exacerbation    Comorbidities Anxiety, AVM, high cholesterol. GERD, hypothyroidism.    Examination-Activity Limitations Squat;Lift;Stairs;Locomotion Level;Stand    Examination-Participation Restrictions Shop;Community Activity;Occupation    Stability/Clinical Decision Making Stable/Uncomplicated    Rehab Potential Good    PT Frequency 2x / week    PT Duration 8 weeks    PT Treatment/Interventions ADLs/Self Care Home Management;Aquatic Therapy;Cryotherapy;Electrical Stimulation;Moist Heat;DME Instruction;Contrast Bath;Gait training;Stair training;Functional mobility  training;Therapeutic activities;Therapeutic exercise;Balance training;Neuromuscular re-education;Patient/family education;Manual techniques;Passive range of motion;Dry needling    PT Next Visit Plan Progression to stage III of protocol and measurement of ROM inversion    PT Home Exercise Plan MED[facility]GE [email or web address]    Consulted and Agree with Plan of Care Patient             Patient will benefit from skilled therapeutic intervention in order to improve the following deficits and impairments:  Decreased mobility, Decreased activity tolerance, Decreased range of motion, Decreased strength, Difficulty walking, Impaired flexibility  Visit Diagnosis: Stiffness of left foot, not elsewhere classified  Other abnormalities of gait and mobility     Problem List Patient Active Problem List   Diagnosis Date Noted   Fatty liver 88/91/6945   Umbilical hernia 03/88/8280   Stress incontinence 12/03/2020   Altered bowel habits    Tendinitis of upper biceps tendon of left shoulder 07/28/2019   Anxiety and depression 07/03/2019   Obstructive sleep apnea 07/03/2019   Subjective pulsatile tinnitus of right ear 07/03/2019   Nontraumatic incomplete tear of left rotator cuff 05/23/2019   Rotator cuff tendinitis, left 05/23/2019   Stress 12/22/2018   Loose stools 12/22/2018   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, [place], [address] Phone: [phone]   Fax:  [phone]  Name:  CHARLETT MERKLE MRN: [id number] Date of Birth: May 25, 1971

## 2021-05-21 ENCOUNTER — Encounter: Payer: Self-pay | Admitting: Physical Therapy

## 2021-05-21 ENCOUNTER — Ambulatory Visit: Payer: BC Managed Care – PPO | Admitting: Physical Therapy

## 2021-05-21 DIAGNOSIS — M25675 Stiffness of left foot, not elsewhere classified: Secondary | ICD-10-CM | POA: Diagnosis not present

## 2021-05-21 DIAGNOSIS — M533 Sacrococcygeal disorders, not elsewhere classified: Secondary | ICD-10-CM | POA: Diagnosis not present

## 2021-05-21 DIAGNOSIS — M2170 Unequal limb length (acquired), unspecified site: Secondary | ICD-10-CM | POA: Diagnosis not present

## 2021-05-21 DIAGNOSIS — M25572 Pain in left ankle and joints of left foot: Secondary | ICD-10-CM | POA: Diagnosis not present

## 2021-05-21 DIAGNOSIS — R2689 Other abnormalities of gait and mobility: Secondary | ICD-10-CM | POA: Diagnosis not present

## 2021-05-21 NOTE — Therapy (Signed)
[facility] 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   Plaques on brainstem, followed by neurology   Elevated cholesterol    GERD (gastroesophageal reflux disease)    History of blood transfusion    Hypothyroidism    Low blood pressure    Pre-diabetes    Urinary incontinence    After delivery of her recent baby    Past Surgical History:  Procedure Laterality Date   BICEPT TENODESIS  07/28/2019   Procedure: MINI OPEN BICEPS TENODESIS;  Surgeon: Corky Mull, MD;  Location: [facility] ORS;  Service: Orthopedics;;   BLADDER SURGERY     as a child   COLONOSCOPY WITH PROPOFOL N/A 02/01/2020   Procedure: COLONOSCOPY WITH PROPOFOL;  Surgeon: Lin Landsman, MD;  Location: [facility] ENDOSCOPY;  Service: Gastroenterology;  Laterality: N/A;   ESOPHAGOGASTRODUODENOSCOPY (EGD) WITH PROPOFOL N/A 02/01/2020   Procedure: ESOPHAGOGASTRODUODENOSCOPY (EGD) WITH PROPOFOL;  Surgeon: Lin Landsman, MD;  Location: [facility];  Service: Gastroenterology;  Laterality: N/A;   IR ANGIO INTRA EXTRACRAN SEL COM CAROTID INNOMINATE BILAT MOD SED  08/12/2019   IR ANGIO VERTEBRAL SEL VERTEBRAL BILAT MOD SED  08/12/2019   IR ANGIOGRAM EXTREMITY LEFT  08/12/2019   IR US GUIDE VASC ACCESS RIGHT  08/12/2019   MOUTH SURGERY     NASAL SEPTUM SURGERY     REFRACTIVE SURGERY     SHOULDER ARTHROSCOPY WITH SUBACROMIAL DECOMPRESSION, ROTATOR CUFF REPAIR AND BICEP TENDON REPAIR Left 07/28/2019   Procedure: SHOULDER ARTHROSCOPY  WITH, DEBIRDEMENT, SUBACROMIAL DECOMPRESSION, MINI OPEN ROTATOR CUFF REPAIR;  Surgeon: Corky Mull, MD;  Location: [facility] ORS;  Service: Orthopedics;  Laterality: Left;   TONSILLECTOMY      There were no vitals filed for this visit.   Subjective Assessment - 05/21/21 1105     Subjective Pt reports no pain since last visit, and she is eager to try and walk without boot. She has walked some at home without boot but not placed weight on heel.    Pertinent History Pt is a 50 y.o. female referred to PT for L foot plantar fasciitis s/p Tenex procedure on 6/3. Per MD note pt is to use boot and crutches and attached to order is Tenex protocol. Pt has PMH that includes: Anxiety, AVM, high cholesterol. GERD, hypothyroidism. Pt reports battling L foot plantar fasciitis for ~ 20 years and has tried multiple conservative management for it with no relief. Post procedure pt has had no pain. Pt reports MD cleared her for WBAT with crutches. Pt reports able to reduce from B crutch use to single crutch usetomorrow and able to fully weightbear on Friday 1 week post op. Pt's big goal is being pain free after healing of her procedure, able to walk long distances, exercises, stand on feet for longer. Worst pain prior to operation 5-8/10 NPS.    Limitations House hold activities;Walking;Standing    How long can you stand comfortably? 1 hour    How long can you walk comfortably? up to 5/10 NPS  before needing to have a seat.    Currently in Pain? No/denies             THEREX   Walked with sneakers 10 ft x 5 with intermittent 1 UE support  -Able to contact with heel strike on left during initial contact   Gastroc Stretch 1 x 30 sec   Soleus Stretch 1 x 30 sec   Towel Scrunches 5x   Single Leg Stance 1 x 5 30 sec   Standing Heel Raises with BUE support 2 x 10   Standing Hip Abduction with BUE support 2 x 10   Standing Hamstring Curl 2 x 10 with BUE support 2 x 10     PT Short Term Goals - 05/16/21 1430        PT SHORT TERM GOAL #1   Title Pt will be independent with HEP to improve strength and mobility of BLE's for functional mobility.    Baseline 6/7: initiated    Time 4    Period Weeks    Status New    Target Date 06/04/21      PT SHORT TERM GOAL #2   Title Pt will have L ankle PROM equal to R ankle PROM to display appropriate joint motion for standing and walking tasks    Baseline 6/13: L foot PROM nearly symmetrical with R foot with exception of inversion.    Time 4    Period Weeks    Status Partially Met    Target Date 06/04/21               PT Long Term Goals - 05/16/21 1429       PT LONG TERM GOAL #1   Title Pt will improve FOTO to target score to display improvements in functional mobility.    Baseline 6/7: 54/70    Time 8    Period Weeks    Status New      PT LONG TERM GOAL #2   Title Pt will improve L ankle AROM equal to R ankle to display adequate motion to perform ambulatory tasks required for home, work, community ADL's.    Baseline 6/13: See AROM of L ankle in objective. Nearly symmetrical with exception of inversion    Time 8    Period Weeks    Status Partially Met      PT LONG TERM GOAL #3   Title Pt will report ability to walk > 1 mile with < 2/10 pain NPS to display improvements in leisure walking activities.    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    Baseline 6/7: can only stand for 1 hour    Time 8    Period Weeks    Status New             Access Code: [email or web address] URL: [email or web address] Date: 05/21/2021 Prepared by: Bradly Chris  Exercises Seated Long Arc Quad - 1 x daily - 7 x weekly - 2 sets - 20 reps Seated Ankle Alphabet - 1 x daily - 7 x weekly - 1 sets - 5 reps Supine Active Straight Leg Raise - 1 x daily - 7 x weekly - 2 sets - 20  reps Seated  Hamstring Stretch - 1 x daily - 7 x weekly - 3 sets - 30 reps Prone Quadriceps Stretch with Strap - 1 x daily - 7 x weekly - 3 sets - 30 reps Half Kneeling Hip Flexor Stretch with Chair - 1 x daily - 7 x weekly - 3 sets - 30 reps Sidelying ITB Stretch off Table - 1 x daily - 7 x weekly - 3 sets - 30 reps Supine Bridge - 1 x daily - 7 x weekly - 3 sets - 10 reps Seated Toe Towel Scrunches - 1 x daily - 3 x weekly - 5 sets - 10 reps Single Leg Stance - 1 x daily - 3 x weekly - 1 sets - 5 reps - 30 hold Standing Hip Abduction with Counter Support - 1 x daily - 3 x weekly - 3 sets - 10 reps Standing Alternating Knee Flexion - 1 x daily - 3 x weekly - 3 sets - 10 reps Heel rises with counter support - 1 x daily - 3 x weekly - 3 sets - 10 reps Soleus Stretch on Wall - 1 x daily - 7 x weekly - 1 sets - 5 reps - 30 hold Soleus Stretch on Wall - 1 x daily - 3 x weekly - 1 sets - 5 reps - 30 hold    Patient will benefit from skilled therapeutic intervention in order to improve the following deficits and impairments:     Visit Diagnosis: No diagnosis found.    Access Code: [email or web address] URL: [email or web address] Date: 05/21/2021 Prepared by: Bradly Chris  Exercises Seated Long Arc Quad - 1 x daily - 7 x weekly - 2 sets - 20 reps Seated Ankle Alphabet - 1 x daily - 7 x weekly - 1 sets - 5 reps Supine Active Straight Leg Raise - 1 x daily - 7 x weekly - 2 sets - 20 reps Seated Hamstring Stretch - 1 x daily - 7 x weekly - 3 sets - 30 reps Prone Quadriceps Stretch with Strap - 1 x daily - 7 x weekly - 3 sets - 30 reps Half Kneeling Hip Flexor Stretch with Chair - 1 x daily - 7 x weekly - 3 sets - 30 reps Sidelying ITB Stretch off Table - 1 x daily - 7 x weekly - 3 sets - 30 reps Supine Bridge - 1 x daily - 7 x weekly - 3 sets - 10 reps Seated Toe Towel Scrunches - 1 x daily - 3 x weekly - 5 sets - 10 reps Single Leg Stance - 1 x daily - 3 x weekly - 1 sets - 5 reps - 30  hold Standing Hip Abduction with Counter Support - 1 x daily - 3 x weekly - 3 sets - 10 reps Standing Alternating Knee Flexion - 1 x daily - 3 x weekly - 3 sets - 10 reps Heel rises with counter support - 1 x daily - 3 x weekly - 3 sets - 10 reps Soleus Stretch on Wall - 1 x daily - 7 x weekly - 1 sets - 5 reps - 30 hold Soleus Stretch on Wall - 1 x daily - 3 x weekly - 1 sets - 5 reps - 30 hold    Problem List Patient Active Problem List   Diagnosis Date Noted   Fatty liver 38/17/7116   Umbilical hernia 57/90/3833   Stress incontinence 12/03/2020   Altered bowel habits    Tendinitis of upper biceps tendon of left  shoulder 07/28/2019   Anxiety and depression 07/03/2019   Obstructive sleep apnea 07/03/2019   Subjective pulsatile tinnitus of right ear 07/03/2019   Nontraumatic incomplete tear of left rotator cuff 05/23/2019   Rotator cuff tendinitis, left 05/23/2019   Stress 12/22/2018   Loose stools 12/22/2018   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, [place], [address] Phone: [phone]   Fax:  [phone]  Name: Stephanie Shields MRN: [id number] Date of Birth: May 28, 1971

## 2021-05-23 ENCOUNTER — Ambulatory Visit: Payer: BC Managed Care – PPO | Admitting: Physical Therapy

## 2021-05-23 ENCOUNTER — Other Ambulatory Visit: Payer: Self-pay

## 2021-05-23 ENCOUNTER — Other Ambulatory Visit: Payer: BC Managed Care – PPO

## 2021-05-23 DIAGNOSIS — R2689 Other abnormalities of gait and mobility: Secondary | ICD-10-CM | POA: Diagnosis not present

## 2021-05-23 DIAGNOSIS — M25675 Stiffness of left foot, not elsewhere classified: Secondary | ICD-10-CM

## 2021-05-23 DIAGNOSIS — M2170 Unequal limb length (acquired), unspecified site: Secondary | ICD-10-CM | POA: Diagnosis not present

## 2021-05-23 DIAGNOSIS — M533 Sacrococcygeal disorders, not elsewhere classified: Secondary | ICD-10-CM | POA: Diagnosis not present

## 2021-05-23 DIAGNOSIS — M25572 Pain in left ankle and joints of left foot: Secondary | ICD-10-CM | POA: Diagnosis not present

## 2021-05-23 NOTE — Therapy (Signed)
[facility] [address], [place], [address] Phone: [phone]   Fax:  [phone]  Physical Therapy Treatment  Patient Details  Name: Stephanie Shields MRN: [id number] Date of Birth: [birth year] Referring Provider (PT): Stacey Drain   Encounter Date: 05/23/2021   PT End of Session - 05/23/21 1152     Visit Number 5    Number of Visits 17    Date for PT Re-Evaluation 07/02/21    PT Start Time 1100    PT Stop Time 1145    PT Time Calculation (min) 45 min    Activity Tolerance Patient tolerated treatment well;No increased pain    Behavior During Therapy WFL for tasks assessed/performed             Past Medical History:  Diagnosis Date   Allergic rhinitis    Anxiety    AVM (arteriovenous malformation) brain    MRI'S SHOW CHRONIC HEMORRHAGE IN CEREBELLUM   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   Plaques on brainstem, followed by neurology   Elevated cholesterol    GERD (gastroesophageal reflux disease)    History of blood transfusion    Hypothyroidism    Low blood pressure    Pre-diabetes    Urinary incontinence    After delivery of her recent baby    Past Surgical History:  Procedure Laterality Date   BICEPT TENODESIS  07/28/2019   Procedure: MINI OPEN BICEPS TENODESIS;  Surgeon: Corky Mull, MD;  Location: [facility] ORS;  Service: Orthopedics;;   BLADDER SURGERY     as a child   COLONOSCOPY WITH PROPOFOL N/A 02/01/2020   Procedure: COLONOSCOPY WITH PROPOFOL;  Surgeon: Lin Landsman, MD;  Location: [facility] ENDOSCOPY;  Service: Gastroenterology;  Laterality: N/A;   ESOPHAGOGASTRODUODENOSCOPY (EGD) WITH PROPOFOL N/A 02/01/2020   Procedure: ESOPHAGOGASTRODUODENOSCOPY (EGD) WITH PROPOFOL;  Surgeon: Lin Landsman, MD;  Location: [facility] ENDOSCOPY;  Service: Gastroenterology;  Laterality: N/A;   IR ANGIO INTRA EXTRACRAN SEL COM CAROTID INNOMINATE BILAT MOD SED  08/12/2019   IR ANGIO  VERTEBRAL SEL VERTEBRAL BILAT MOD SED  08/12/2019   IR ANGIOGRAM EXTREMITY LEFT  08/12/2019   IR US GUIDE VASC ACCESS RIGHT  08/12/2019   MOUTH SURGERY     NASAL SEPTUM SURGERY     REFRACTIVE SURGERY     SHOULDER ARTHROSCOPY WITH SUBACROMIAL DECOMPRESSION, ROTATOR CUFF REPAIR AND BICEP TENDON REPAIR Left 07/28/2019   Procedure: SHOULDER ARTHROSCOPY WITH, DEBIRDEMENT, SUBACROMIAL DECOMPRESSION, MINI OPEN ROTATOR CUFF REPAIR;  Surgeon: Corky Mull, MD;  Location: [facility] ORS;  Service: Orthopedics;  Laterality: Left;   TONSILLECTOMY      There were no vitals filed for this visit.   Subjective Assessment - 05/23/21 1105     Subjective Pt reports that she feels some soreness in her left foot. Has gone grocery shopping and walked without boot for the past week.    Pertinent History Pt is a 50 y.o. female referred to PT for L foot plantar fasciitis s/p Tenex procedure on 6/3. Per MD note pt is to use boot and crutches and attached to order is Tenex protocol. Pt has PMH that includes: Anxiety, AVM, high cholesterol. GERD, hypothyroidism. Pt reports battling L foot plantar fasciitis for ~ 20 years and has tried multiple conservative management for it with no relief. Post procedure pt has had no pain. Pt reports MD cleared her for WBAT with crutches. Pt reports able to reduce from B crutch use  to single crutch usetomorrow and able to fully weightbear on Friday 1 week post op. Pt's big goal is being pain free after healing of her procedure, able to walk long distances, exercises, stand on feet for longer. Worst pain prior to operation 5-8/10 NPS.    Limitations House hold activities;Walking;Standing    How long can you stand comfortably? 1 hour    How long can you walk comfortably? up to 5/10 NPS before needing to have a seat.    Currently in Pain? Yes    Pain Score 0-No pain            THEREX  Ankle 4 ways with GTB  -PF 1x15  -DF 1x15 -Ev 1x15 -Inv 1x15  Vcs and Tc on band placement and how to  position for exercise    Side Step Ups on 4 inch Yoga Block 2x10 -Vcs to decrease descent    Single Leg Stance on LLE 30 sec  Single Leg Stance on toe taps 1 x 10  Single Leg Stance with Eyes Closed 2 x 30 sec  Single Leg Stance with on Foam     PT Education - 05/23/21 1151     Education Details form/sequence for exercise    Person(s) Educated Patient    Methods Explanation;Demonstration;Verbal cues    Comprehension Verbalized understanding;Returned demonstration;Verbal cues required              PT Short Term Goals - 05/23/21 1201       PT SHORT TERM GOAL #1   Title Pt will be independent with HEP to improve strength and mobility of BLE's for functional mobility.    Baseline 6/7: initiated    Time 4    Period Weeks    Status New    Target Date 06/04/21      PT SHORT TERM GOAL #2   Title Pt will have L ankle PROM equal to R ankle PROM to display appropriate joint motion for standing and walking tasks    Baseline 6/13: L foot PROM nearly symmetrical with R foot with exception of inversion.    Time 4    Period Weeks    Status Partially Met    Target Date 06/04/21               PT Long Term Goals - 05/23/21 1201       PT LONG TERM GOAL #1   Title Pt will improve FOTO to target score to display improvements in functional mobility.    Baseline 6/7: 54/70    Time 8    Period Weeks    Status New      PT LONG TERM GOAL #2   Title Pt will improve L ankle AROM equal to R ankle to display adequate motion to perform ambulatory tasks required for home, work, community ADL's.    Baseline 6/13: See AROM of L ankle in objective. Nearly symmetrical with exception of inversion    Time 8    Period Weeks    Status Partially Met      PT LONG TERM GOAL #3   Title Pt will report ability to walk > 1 mile with < 2/10 pain NPS to display improvements in leisure walking activities.    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    Baseline 6/7: can only stand for 1 hour    Time 8    Period Weeks    Status New            Home exercises are as follows:   Access Code: [email or web address] URL: [email or web address] Date: 05/23/2021 Prepared by: Bradly Chris  Exercises Seated Hamstring Stretch - 1 x daily - 7 x weekly - 3 sets - 30 reps Prone Quadriceps Stretch with Strap - 1 x daily - 7 x weekly - 3 sets - 30 reps Half Kneeling Hip Flexor Stretch with Chair - 1 x daily - 7 x weekly - 3 sets - 30 reps Sidelying ITB Stretch off Table - 1 x daily - 7 x weekly - 3 sets - 30 reps Supine Bridge - 1 x daily - 7 x weekly - 3 sets - 10 reps Single Leg Stance - 1 x daily - 3 x weekly - 1 sets - 5 reps - 30 hold Standing Alternating Knee Flexion - 1 x daily - 3 x weekly - 3 sets - 10 reps Heel rises with counter support - 1 x daily - 3 x weekly - 3 sets - 10 reps Soleus Stretch on Wall - 1 x daily - 7 x weekly - 1 sets - 5 reps - 30 hold Lateral Step Up with Unilateral Counter Support - 1 x daily - 3 x weekly - 2 sets - 10 reps Long Sitting Ankle Inversion with Resistance - 1 x daily - 3 x weekly - 3 sets - 10 reps Long Sitting Ankle Eversion with Resistance - 1 x daily - 3 x weekly - 3 sets - 10 reps Long Sitting Ankle Plantar Flexion with Resistance - 1 x daily - 3 x weekly - 3 sets - 10 reps Long Sitting Ankle Dorsiflexion with Anchored Resistance - 1 x daily - 3 x weekly - 3 sets - 10 reps       Plan - 05/23/21 1153     Clinical Impression Statement Pt presents week 3 post op for Tenex procedure for debridement of plantar fascia. Pt continues to demonstrate improvement with strength ability to weight bear without boot for past week, and ability to complete single leg stance exercises without an increase in her pain. She will continue to benefit from skilled PT to progress through post op  protocol. For week 4, which includes closed chain double leg press and dynamic walking activities as long as she is not feeling excessive soreness.    Personal Factors and Comorbidities Age;Comorbidity 3+;Fitness;Past/Current Experience;Profession;Time since onset of injury/illness/exacerbation    Comorbidities Anxiety, AVM, high cholesterol. GERD, hypothyroidism.    Examination-Activity Limitations Squat;Lift;Stairs;Locomotion Level;Stand    Examination-Participation Restrictions Shop;Community Activity;Occupation    Stability/Clinical Decision Making Stable/Uncomplicated    Rehab Potential Good    PT Frequency 2x / week    PT Duration 8 weeks    PT Treatment/Interventions ADLs/Self Care Home Management;Aquatic Therapy;Cryotherapy;Electrical Stimulation;Moist Heat;DME Instruction;Contrast Bath;Gait training;Stair training;Functional mobility training;Therapeutic activities;Therapeutic exercise;Balance training;Neuromuscular re-education;Patient/family education;Manual techniques;Passive range of motion;Dry needling    PT Next Visit Plan Progression to week 4 activities on Tenex protocol: dynamic walking (toe walk, heel walk), and general aerobic conditioning.    PT Home Exercise Plan [facility] [email or web address]    Consulted and Agree with Plan of Care Patient             Patient will benefit from skilled therapeutic  intervention in order to improve the following deficits and impairments:  Decreased mobility, Decreased activity tolerance, Decreased range of motion, Decreased strength, Difficulty walking, Impaired flexibility  Visit Diagnosis: Stiffness of left foot, not elsewhere classified  Other abnormalities of gait and mobility    Problem List Patient Active Problem List   Diagnosis Date Noted   Fatty liver 44/31/5400   Umbilical hernia 86/76/1950   Stress incontinence 12/03/2020   Altered bowel habits    Tendinitis of upper biceps tendon of left shoulder 07/28/2019   Anxiety and  depression 07/03/2019   Obstructive sleep apnea 07/03/2019   Subjective pulsatile tinnitus of right ear 07/03/2019   Nontraumatic incomplete tear of left rotator cuff 05/23/2019   Rotator cuff tendinitis, left 05/23/2019   Stress 12/22/2018   Loose stools 12/22/2018   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 [place], [address] Phone: [phone]   Fax:  [phone]  Name: BICH MCHANEY MRN: [id number] Date of Birth: 16-Jul-1971

## 2021-05-27 ENCOUNTER — Ambulatory Visit: Payer: BC Managed Care – PPO | Admitting: Family Medicine

## 2021-05-28 ENCOUNTER — Ambulatory Visit: Payer: BC Managed Care – PPO | Admitting: Physical Therapy

## 2021-05-28 DIAGNOSIS — M25675 Stiffness of left foot, not elsewhere classified: Secondary | ICD-10-CM | POA: Diagnosis not present

## 2021-05-28 DIAGNOSIS — R2689 Other abnormalities of gait and mobility: Secondary | ICD-10-CM | POA: Diagnosis not present

## 2021-05-28 DIAGNOSIS — M2170 Unequal limb length (acquired), unspecified site: Secondary | ICD-10-CM | POA: Diagnosis not present

## 2021-05-28 DIAGNOSIS — M533 Sacrococcygeal disorders, not elsewhere classified: Secondary | ICD-10-CM | POA: Diagnosis not present

## 2021-05-28 DIAGNOSIS — M25572 Pain in left ankle and joints of left foot: Secondary | ICD-10-CM | POA: Diagnosis not present

## 2021-05-28 NOTE — Therapy (Signed)
[facility] [address] [place], [address] Phone: [phone]   Fax:  [phone]  Physical Therapy Treatment  Patient Details  Name: Stephanie Shields MRN: [id number] Date of Birth: October 08, 1971 Referring Provider (PT): Stacey Drain   Encounter Date: 05/28/2021   PT End of Session - 05/28/21 1249     Visit Number 5    Number of Visits 17    Date for PT Re-Evaluation 07/02/21    PT Start Time 1100    PT Stop Time 1145    PT Time Calculation (min) 45 min    Activity Tolerance Patient tolerated treatment well;No increased pain    Behavior During Therapy WFL for tasks assessed/performed             Past Medical History:  Diagnosis Date   Allergic rhinitis    Anxiety    AVM (arteriovenous malformation) brain    MRI'S SHOW CHRONIC HEMORRHAGE IN CEREBELLUM   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   Plaques on brainstem, followed by neurology   Elevated cholesterol    GERD (gastroesophageal reflux disease)    History of blood transfusion    Hypothyroidism    Low blood pressure    Pre-diabetes    Urinary incontinence    After delivery of her recent baby    Past Surgical History:  Procedure Laterality Date   BICEPT TENODESIS  07/28/2019   Procedure: MINI OPEN BICEPS TENODESIS;  Surgeon: Corky Mull, MD;  Location: [facility] ORS;  Service: Orthopedics;;   BLADDER SURGERY     as a child   COLONOSCOPY WITH PROPOFOL N/A 02/01/2020   Procedure: COLONOSCOPY WITH PROPOFOL;  Surgeon: Lin Landsman, MD;  Location: [facility] ENDOSCOPY;  Service: Gastroenterology;  Laterality: N/A;   ESOPHAGOGASTRODUODENOSCOPY (EGD) WITH PROPOFOL N/A 02/01/2020   Procedure: ESOPHAGOGASTRODUODENOSCOPY (EGD) WITH PROPOFOL;  Surgeon: Lin Landsman, MD;  Location: [facility] ENDOSCOPY;  Service: Gastroenterology;  Laterality: N/A;   IR ANGIO INTRA EXTRACRAN SEL COM CAROTID INNOMINATE BILAT MOD SED  08/12/2019   IR ANGIO  VERTEBRAL SEL VERTEBRAL BILAT MOD SED  08/12/2019   IR ANGIOGRAM EXTREMITY LEFT  08/12/2019   IR US GUIDE VASC ACCESS RIGHT  08/12/2019   MOUTH SURGERY     NASAL SEPTUM SURGERY     REFRACTIVE SURGERY     SHOULDER ARTHROSCOPY WITH SUBACROMIAL DECOMPRESSION, ROTATOR CUFF REPAIR AND BICEP TENDON REPAIR Left 07/28/2019   Procedure: SHOULDER ARTHROSCOPY WITH, DEBIRDEMENT, SUBACROMIAL DECOMPRESSION, MINI OPEN ROTATOR CUFF REPAIR;  Surgeon: Corky Mull, MD;  Location: [facility] ORS;  Service: Orthopedics;  Laterality: Left;   TONSILLECTOMY      There were no vitals filed for this visit.   Subjective Assessment - 05/28/21 1108     Subjective Pt reports that she feels some soreness at end of day, but otherwise she has been doing well. She recently had an appointment with physician who said her progress has been going well.    Pertinent History Pt is a 50 y.o. female referred to PT for L foot plantar fasciitis s/p Tenex procedure on 6/3. Per MD note pt is to use boot and crutches and attached to order is Tenex protocol. Pt has PMH that includes: Anxiety, AVM, high cholesterol. GERD, hypothyroidism. Pt reports battling L foot plantar fasciitis for ~ 20 years and has tried multiple conservative management for it with no relief. Post procedure pt has had no pain. Pt reports MD cleared her for WBAT with  crutches. Pt reports able to reduce from B crutch use to single crutch usetomorrow and able to fully weightbear on Friday 1 week post op. Pt's big goal is being pain free after healing of her procedure, able to walk long distances, exercises, stand on feet for longer. Worst pain prior to operation 5-8/10 NPS.    Limitations House hold activities;Walking;Standing    How long can you stand comfortably? 1 hour    How long can you walk comfortably? up to 5/10 NPS before needing to have a seat.    Currently in Pain? No/denies    Pain Score 0-No pain            THEREX:  Heel Raises Double Up, Single Down on Left 2  x 10 -Vcs to slowly lower down   Step Downs off 4 inch yoga block 2 x 10  -Vcs to decrease speed of exercise   Side Step Downs off 4 inch yoga block 2 x 10  Lateral Walking 4 x 10 m   Toe Walking 4 x 10 m   Heel Walking 4 x 10 m   Heel to Toe Waling 2 x 10 m   Recumbent Bicycle 4 min on Level 1  -Education on RPE and use of singing test to determine moderate intensity. Instructed pt to complete cardio 5 min every other day at mod intensity.   Educated pt on plantar fascia massage with lacrosse ball   Updated HEP and educated patient on changes to exercises and addition of new exercises      PT Short Term Goals - 05/28/21 1243       PT SHORT TERM GOAL #1   Title Pt will be independent with HEP to improve strength and mobility of BLE's for functional mobility.    Baseline 6/7: initiated    Time 4    Period Weeks    Status New    Target Date 06/04/21      PT SHORT TERM GOAL #2   Title Pt will have L ankle PROM equal to R ankle PROM to display appropriate joint motion for standing and walking tasks    Baseline 6/13: L foot PROM nearly symmetrical with R foot with exception of inversion.    Time 4    Period Weeks    Status Partially Met    Target Date 06/04/21               PT Long Term Goals - 05/28/21 1244       PT LONG TERM GOAL #1   Title Pt will improve FOTO to target score to display improvements in functional mobility.    Baseline 6/7: 54/70    Time 8    Period Weeks    Status New      PT LONG TERM GOAL #2   Title Pt will improve L ankle AROM equal to R ankle to display adequate motion to perform ambulatory tasks required for home, work, community ADL's.    Baseline 6/13: See AROM of L ankle in objective. Nearly symmetrical with exception of inversion    Time 8    Period Weeks    Status Partially Met      PT LONG TERM GOAL #3   Title Pt will report ability to walk > 1 mile with < 2/10 pain NPS to display improvements in leisure walking  activities.    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 Pt demonstrates continued improvement with ability to perform single leg hip strengthening exercises, and ability to perform dynamic walking exercises without an increase in pain. Pt educated on beginning aerobic activity targeted to lower extremities. Pt will continue to benefit from skilled PT to progress single leg activity and walking tolerance for left foot to be able to tolerate prolonged walking and standing activities.    Personal Factors and Comorbidities Age;Comorbidity 3+;Fitness;Past/Current Experience;Profession;Time since onset of injury/illness/exacerbation    Comorbidities Anxiety, AVM, high cholesterol. GERD, hypothyroidism.    Examination-Activity Limitations Squat;Lift;Stairs;Locomotion Level;Stand    Examination-Participation Restrictions Shop;Community Activity;Occupation    Stability/Clinical Decision Making Stable/Uncomplicated    Rehab Potential Good    PT Frequency 2x / week    PT Duration 8 weeks    PT Treatment/Interventions ADLs/Self Care Home Management;Aquatic Therapy;Cryotherapy;Electrical Stimulation;Moist Heat;DME Instruction;Contrast Bath;Gait training;Stair training;Functional mobility training;Therapeutic activities;Therapeutic exercise;Balance training;Neuromuscular re-education;Patient/family education;Manual techniques;Passive range of motion;Dry needling    PT Next Visit Plan Single leg stance balance activities foam and ball toss.    PT Home Exercise Plan MEDBRIDGE [email or web address]     Consulted and Agree with Plan of Care Patient            Access Code: [email or web address] URL: [email or web address] Date: 05/28/2021 Prepared by: Bradly Chris  Exercises Seated Hamstring Stretch - 1 x daily - 7 x weekly - 3 sets - 30 reps Prone Quadriceps Stretch with Strap - 1 x daily - 7 x weekly - 3 sets - 30 reps Half Kneeling Hip Flexor Stretch with Chair - 1 x daily - 7 x weekly - 3 sets - 30 reps Sidelying ITB Stretch off Table - 1 x daily - 7 x weekly - 3 sets - 30 reps Single Leg Stance - 1 x daily - 3 x weekly - 1 sets - 5 reps - 30 hold Soleus Stretch on Wall - 1 x daily - 7 x weekly - 1 sets - 5 reps - 30 hold Long Sitting Ankle Inversion with Resistance - 1 x daily - 3 x weekly - 3 sets - 10 reps Long Sitting Ankle Eversion with Resistance - 1 x daily - 3 x weekly - 3 sets - 10 reps Long Sitting Ankle Plantar Flexion with Resistance - 1 x daily - 3 x weekly - 3 sets - 10 reps Long Sitting Ankle Dorsiflexion with Anchored Resistance - 1 x daily - 3 x weekly - 3 sets - 10 reps Forward Step Down - 1 x daily - 3 x weekly - 2 sets - 10 reps Lateral Step Down - 1 x daily - 3 x weekly - 2 sets - 10 reps   Patient will benefit from skilled therapeutic intervention in order to improve the following deficits and impairments:  Decreased mobility, Decreased activity tolerance, Decreased range of motion, Decreased strength, Difficulty walking, Impaired flexibility  Visit Diagnosis: Stiffness of  left foot, not elsewhere classified  Other abnormalities of gait and mobility     Problem List Patient Active Problem List   Diagnosis Date Noted   Fatty liver 74/14/2395   Umbilical hernia 32/01/3342   Stress incontinence 12/03/2020   Altered bowel habits    Tendinitis of upper biceps tendon of left shoulder 07/28/2019   Anxiety and depression 07/03/2019   Obstructive sleep apnea 07/03/2019   Subjective pulsatile tinnitus of right ear 07/03/2019   Nontraumatic incomplete  tear of left rotator cuff 05/23/2019   Rotator cuff tendinitis, left 05/23/2019   Stress 12/22/2018   Loose stools 12/22/2018   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 [place], [address] Phone: [phone]   Fax:  [phone]  Name: Stephanie Shields MRN: [id number] Date of Birth: Oct 22, 1971

## 2021-05-30 ENCOUNTER — Ambulatory Visit: Payer: BC Managed Care – PPO | Admitting: Physical Therapy

## 2021-05-30 DIAGNOSIS — M25675 Stiffness of left foot, not elsewhere classified: Secondary | ICD-10-CM | POA: Diagnosis not present

## 2021-05-30 DIAGNOSIS — R2689 Other abnormalities of gait and mobility: Secondary | ICD-10-CM | POA: Diagnosis not present

## 2021-05-30 DIAGNOSIS — M25572 Pain in left ankle and joints of left foot: Secondary | ICD-10-CM | POA: Diagnosis not present

## 2021-05-30 DIAGNOSIS — M533 Sacrococcygeal disorders, not elsewhere classified: Secondary | ICD-10-CM | POA: Diagnosis not present

## 2021-05-30 DIAGNOSIS — F909 Attention-deficit hyperactivity disorder, unspecified type: Secondary | ICD-10-CM | POA: Diagnosis not present

## 2021-05-30 DIAGNOSIS — M2170 Unequal limb length (acquired), unspecified site: Secondary | ICD-10-CM | POA: Diagnosis not present

## 2021-05-30 NOTE — Therapy (Signed)
[facility] [address] [place], [address] Phone: [phone]   Fax:  [phone]  Physical Therapy Treatment  Patient Details  Name: Stephanie Shields MRN: [id number] Date of Birth: Sep 18, 1971 Referring Provider (PT): Stacey Drain   Encounter Date: 05/30/2021   PT End of Session - 05/31/21 0929     Visit Number 7    Number of Visits 17    Date for PT Re-Evaluation 07/02/21    PT Start Time 7253    PT Stop Time 1500    PT Time Calculation (min) 45 min    Activity Tolerance Patient tolerated treatment well;No increased pain    Behavior During Therapy WFL for tasks assessed/performed            Past Medical History:  Diagnosis Date   Allergic rhinitis    Anxiety    AVM (arteriovenous malformation) brain    MRI'S SHOW CHRONIC HEMORRHAGE IN CEREBELLUM   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   Plaques on brainstem, followed by neurology   Elevated cholesterol    GERD (gastroesophageal reflux disease)    History of blood transfusion    Hypothyroidism    Low blood pressure    Pre-diabetes    Urinary incontinence    After delivery of her recent baby    Past Surgical History:  Procedure Laterality Date   BICEPT TENODESIS  07/28/2019   Procedure: MINI OPEN BICEPS TENODESIS;  Surgeon: Corky Mull, MD;  Location: [facility] ORS;  Service: Orthopedics;;   BLADDER SURGERY     as a child   COLONOSCOPY WITH PROPOFOL N/A 02/01/2020   Procedure: COLONOSCOPY WITH PROPOFOL;  Surgeon: Lin Landsman, MD;  Location: [facility] ENDOSCOPY;  Service: Gastroenterology;  Laterality: N/A;   ESOPHAGOGASTRODUODENOSCOPY (EGD) WITH PROPOFOL N/A 02/01/2020   Procedure: ESOPHAGOGASTRODUODENOSCOPY (EGD) WITH PROPOFOL;  Surgeon: Lin Landsman, MD;  Location[facility] END[facility]COPY;  Service: Gastroenterology;  Laterality: N/A;   IR ANGIO INTRA EXTRACRAN SEL COM CAROTID INNOMINATE BILAT MOD SED  08/12/2019   IR ANGIO  VERTEBRAL SEL VERTEBRAL BILAT MOD SED  08/12/2019   IR ANGIOGRAM EXTREMITY LEFT  08/12/2019   IR US GUIDE VASC ACCESS RIGHT  08/12/2019   MOUTH SURGERY     NASAL SEPTUM SURGERY     REFRACTIVE SURGERY     SHOULDER ARTHROSCOPY WITH SUBACROMIAL DECOMPRESSION, ROTATOR CUFF REPAIR AND BICEP TENDON REPAIR Left 07/28/2019   Procedure: SHOULDER ARTHROSCOPY WITH, DEBIRDEMENT, SUBACROMIAL DECOMPRESSION, MINI OPEN ROTATOR CUFF REPAIR;  Surgeon: Corky Mull, MD;  Location: [facility] ORS;  Service: Orthopedics;  Laterality: Left;   TONSILLECTOMY      There were no vitals filed for this visit.   Subjective Assessment - 05/30/21 1423     Subjective Reports some soreness in left leg from walking [place] zoo, but otherise feels no pain.    Pertinent History Pt is a 50 y.o. female referred to PT for L foot plantar fasciitis s/p Tenex procedure on 6/3. Per MD note pt is to use boot and crutches and attached to order is Tenex protocol. Pt has PMH that includes: Anxiety, AVM, high cholesterol. GERD, hypothyroidism. Pt reports battling L foot plantar fasciitis for ~ 20 years and has tried multiple conservative management for it with no relief. Post procedure pt has had no pain. Pt reports MD cleared her for WBAT with crutches. Pt reports able to reduce from B crutch use to single crutch usetomorrow and able to fully weightbear  on Friday 1 week post op. Pt's big goal is being pain free after healing of her procedure, able to walk long distances, exercises, stand on feet for longer. Worst pain prior to operation 5-8/10 NPS.    Limitations House hold activities;Walking;Standing    How long can you stand comfortably? 1 hour    How long can you walk comfortably? up to 5/10 NPS before needing to have a seat.    Pain Score 0-No pain            NMR:  Single Leg Stance 30 sec  Single Leg Stance 3  [place] Taps x 10  Single Leg Stance on Foam 2 x 30 sec  Single Leg Stance with Press Forward and Press Up x 10  Single Leg Stance  Ball Toss x 10  Single Leg Stance on Foam Ball Toss x 10   Toe Walking 4 x 7 m Heel Walking 4 x 7 m Lateral Walking 4 x 10 m  Tandem Walking 4 x 7 m   4 x 10 ft Ladders Every step  4 x 10 ft Ladders Every Step with 2 foam blocks  4 x 10 ft Ladders Every Other Step with 2 foam blocks 6 x 10 ft Ladders Every Other Step with 2 foam block and 1 hurdle   6 x 10 ft Ladders Every Other Step with 2 foam block and 1 hurdle and 10 lb weight pickup  2 x 10 ft Ladders Every Other Step with 2 foam block and 1 hurdle and 10 lb weight pickup with dual task (naming states with "M")    PT Short Term Goals - 05/31/21 7824       PT SHORT TERM GOAL #1   Title Pt will be independent with HEP to improve strength and mobility of BLE's for functional mobility.    Baseline 6/7: initiated    Time 4    Period Weeks    Status New    Target Date 06/04/21      PT SHORT TERM GOAL #2   Title Pt will have L ankle PROM equal to R ankle PROM to display appropriate joint motion for standing and walking tasks    Baseline 6/13: L foot PROM nearly symmetrical with R foot with exception of inversion.    Time 4    Period Weeks    Status Partially Met    Target Date 06/04/21               PT Long Term Goals - 05/31/21 0918       PT LONG TERM GOAL #1   Title Pt will improve FOTO to target score to display improvements in functional mobility.    Baseline 6/7: 54/70    Time 8    Period Weeks    Status New      PT LONG TERM GOAL #2   Title Pt will improve L ankle AROM equal to R ankle to display adequate motion to perform ambulatory tasks required for home, work, community ADL's.    Baseline 6/13: See AROM of L ankle in objective. Nearly symmetrical with exception of inversion    Time 8    Period Weeks    Status Partially Met      PT LONG TERM GOAL #3   Title Pt will report ability to walk > 1 mile with < 2/10 pain NPS to display improvements in leisure walking activities.    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 Pt demonstrates increased strength and stability in left foot with ability to walk on unstable surfaces without experiencing pain and unsteadiness. Pt has only one more visit before she transfers her care to physical therapist in [place], where she will be moving. 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    Personal Factors and Comorbidities Age;Comorbidity 3+;Fitness;Past/Current Experience;Profession;Time since onset of injury/illness/exacerbation    Comorbidities Anxiety, AVM, high cholesterol. GERD, hypothyroidism.    Examination-Activity Limitations Squat;Lift;Stairs;Locomotion Level;Stand    Examination-Participation Restrictions Shop;Community Activity;Occupation    Stability/Clinical Decision Making Stable/Uncomplicated    Rehab Potential Good    PT Frequency 2x / week    PT Duration 8 weeks    PT Treatment/Interventions ADLs/Self Care Home Management;Aquatic Therapy;Cryotherapy;Electrical Stimulation;Moist Heat;DME Instruction;Contrast Bath;Gait training;Stair training;Functional mobility training;Therapeutic activities;Therapeutic exercise;Balance training;Neuromuscular re-education;Patient/family education;Manual techniques;Passive range of motion;Dry needling    PT Next Visit Plan Continue to walk on uneven surfaces, treadmill training, SLS balance  activities, and work on faxing records to new provider.    PT Home Exercise Plan MEDBRIDGE [email or web address]    Consulted and Agree with Plan of Care Patient            HEP includes the following:  Access Code: [email or web address] URL: [email or web address] Date: 05/31/2021 Prepared by: Bradly Chris  Exercises Seated Hamstring Stretch - 1 x daily - 7 x weekly - 3 sets - 30 reps Prone Quadriceps Stretch with Strap - 1 x daily - 7 x weekly - 3 sets - 30 reps Half Kneeling Hip Flexor Stretch with Chair - 1 x daily - 7 x weekly - 3 sets - 30 reps Sidelying ITB Stretch off Table - 1 x daily - 7 x weekly - 3 sets - 30 reps Single Leg Stance - 1 x daily - 3 x weekly - 1 sets - 5 reps - 30 hold Soleus Stretch on Wall - 1 x daily - 7 x weekly - 1 sets - 5 reps - 30 hold Long Sitting Ankle Inversion with Resistance - 1 x daily - 3 x weekly - 3 sets - 10 reps Long Sitting Ankle Eversion with Resistance - 1 x daily - 3 x weekly - 3 sets - 10 reps Long Sitting Ankle Plantar Flexion with Resistance - 1 x daily - 3 x weekly - 3 sets - 10 reps Long Sitting Ankle Dorsiflexion with Anchored Resistance - 1 x daily - 3 x weekly - 3 sets - 10 reps Forward Step Down - 1 x daily - 3 x weekly - 2 sets - 10 reps Lateral Step Down - 1 x daily - 3 x weekly - 2 sets -  10 reps   Patient will benefit from skilled therapeutic intervention in order to improve the following deficits and impairments:  Decreased mobility, Decreased activity tolerance, Decreased range of motion, Decreased strength, Difficulty walking, Impaired flexibility  Visit Diagnosis: Stiffness of left foot, not elsewhere classified  Other abnormalities of gait and mobility    Problem List Patient Active Problem List   Diagnosis Date Noted   Fatty liver 90/30/0923   Umbilical hernia 30/05/6225   Stress incontinence 12/03/2020   Altered bowel habits    Tendinitis of upper biceps tendon of left shoulder 07/28/2019   Anxiety and depression  07/03/2019   Obstructive sleep apnea 07/03/2019   Subjective pulsatile tinnitus of right ear 07/03/2019   Nontraumatic incomplete tear of left rotator cuff 05/23/2019   Rotator cuff tendinitis, left 05/23/2019   Stress 12/22/2018   Loose stools 12/22/2018   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 [place], [address] Phone: [phone]   Fax:  [phone]  Name: Stephanie Shields MRN: [id number] Date of Birth: 02-09-1971

## 2021-06-04 ENCOUNTER — Ambulatory Visit: Payer: BC Managed Care – PPO | Attending: Family Medicine | Admitting: Physical Therapy

## 2021-06-04 DIAGNOSIS — R2689 Other abnormalities of gait and mobility: Secondary | ICD-10-CM | POA: Diagnosis not present

## 2021-06-04 DIAGNOSIS — G4733 Obstructive sleep apnea (adult) (pediatric): Secondary | ICD-10-CM | POA: Diagnosis not present

## 2021-06-04 DIAGNOSIS — M25675 Stiffness of left foot, not elsewhere classified: Secondary | ICD-10-CM | POA: Diagnosis not present

## 2021-06-04 NOTE — Therapy (Signed)
[facility] [address] [place], [address] Phone: [phone]   Fax:  [phone]  Physical Therapy Treatment/Discharge Summary    Reporting Dates: 05/13/21-06/06/21  Patient Details  Name: Stephanie Shields MRN: [id number] Date of Birth: 1971-03-12 Referring Provider (PT): Stacey Drain   Encounter Date: 06/04/2021   PT End of Session - 06/04/21 1512     Visit Number 6    Number of Visits 17    Date for PT Re-Evaluation 07/02/21    PT Start Time 1500    PT Stop Time 1545    PT Time Calculation (min) 45 min    Activity Tolerance Patient tolerated treatment well;No increased pain    Behavior During Therapy WFL for tasks assessed/performed             Past Medical History:  Diagnosis Date   Allergic rhinitis    Anxiety    AVM (arteriovenous malformation) brain    MRI'S SHOW CHRONIC HEMORRHAGE IN CEREBELLUM   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   Plaques on brainstem, followed by neurology   Elevated cholesterol    GERD (gastroesophageal reflux disease)    History of blood transfusion    Hypothyroidism    Low blood pressure    Pre-diabetes    Urinary incontinence    After delivery of her recent baby    Past Surgical History:  Procedure Laterality Date   BICEPT TENODESIS  07/28/2019   Procedure: MINI OPEN BICEPS TENODESIS;  Surgeon: Corky Mull, MD;  Location: [facility] ORS;  Service: Orthopedics;;   BLADDER SURGERY     as a child   COLONOSCOPY WITH PROPOFOL N/A 02/01/2020   Procedure: COLONOSCOPY WITH PROPOFOL;  Surgeon: Lin Landsman, MD;  Location: [facility] ENDOSCOPY;  Service: Gastroenterology;  Laterality: N/A;   ESOPHAGOGASTRODUODENOSCOPY (EGD) WITH PROPOFOL N/A 02/01/2020   Procedure: ESOPHAGOGASTRODUODENOSCOPY (EGD) WITH PROPOFOL;  Surgeon: Lin Landsman, MD;  Location: [facility] ENDOSCOPY;  Service: Gastroenterology;  Laterality: N/A;   IR ANGIO INTRA EXTRACRAN SEL COM  CAROTID INNOMINATE BILAT MOD SED  08/12/2019   IR ANGIO VERTEBRAL SEL VERTEBRAL BILAT MOD SED  08/12/2019   IR ANGIOGRAM EXTREMITY LEFT  08/12/2019   IR US GUIDE VASC ACCESS RIGHT  08/12/2019   MOUTH SURGERY     NASAL SEPTUM SURGERY     REFRACTIVE SURGERY     SHOULDER ARTHROSCOPY WITH SUBACROMIAL DECOMPRESSION, ROTATOR CUFF REPAIR AND BICEP TENDON REPAIR Left 07/28/2019   Procedure: SHOULDER ARTHROSCOPY WITH, DEBIRDEMENT, SUBACROMIAL DECOMPRESSION, MINI OPEN ROTATOR CUFF REPAIR;  Surgeon: Corky Mull, MD;  Location: [facility] ORS;  Service: Orthopedics;  Laterality: Left;   TONSILLECTOMY      There were no vitals filed for this visit.          PT Education - 06/04/21 1510     Education Details form/sequence for appropriate exercise    Person(s) Educated Patient    Methods Explanation;Verbal cues;Demonstration    Comprehension Verbalized understanding;Verbal cues required;Returned demonstration              PT Short Term Goals - 05/31/21 0918       PT SHORT TERM GOAL #1   Title Pt will be independent with HEP to improve strength and mobility of BLE's for functional mobility.    Baseline 6/7: initiated    Time 4    Period Weeks    Status New    Target Date 06/04/21  PT SHORT TERM GOAL #2   Title Pt will have L ankle PROM equal to R ankle PROM to display appropriate joint motion for standing and walking tasks    Baseline 6/13: L foot PROM nearly symmetrical with R foot with exception of inversion.    Time 4    Period Weeks    Status Partially Met    Target Date 06/04/21               PT Long Term Goals - 06/04/21 1506       PT LONG TERM GOAL #1   Title Pt will improve FOTO to target score to display improvements in functional mobility.    Baseline 6/7: 54/70    Time 8    Period Weeks    Status New      PT LONG TERM GOAL #2   Title Pt will improve L ankle AROM equal to R ankle to display adequate motion to perform ambulatory tasks required for home,  work, community ADL's.    Baseline 6/13: See AROM of L ankle in objective. Nearly symmetrical with exception of inversion    Time 8    Period Weeks    Status Partially Met      PT LONG TERM GOAL #3   Title Pt will report ability to walk > 1 mile with < 2/10 pain NPS to display improvements in leisure walking activities.    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    Baseline 6/7: can only stand for 1 hour    Time 8    Period Weeks    Status New             THEREX   Leg Press #75 3 x 10   Step Downs 3 x 10 on 6 inch steps with 1 UE support  -Vcs to point toes forward  Side Step Ups 3 x 10 on 6 inch steps with 2 UE support   NMR    Single Leg Stance on LLE 30 sec   Single Leg Stance on LLE with Horizontal Head Turns 1 x 10   Single Leg Stance on LLE with Vertical Head Turns 1 x 10  Single Leg Stance on Foam 30 sec  Single Leg Stance on Foam Horizontal Head Turns 1 x 10   Dynamic balance 2 foam pad walks with 6 inch step over and three cone pick up and stack x 4   Dynamic balance 2 foam pad walks with 4 ft foam tandem walk and 3 cone stack x 4  Instructed pt that records would be faxed to new PT office.    Plan - 06/04/21 1512     Clinical Impression Statement Pt presents for final follow-up before transitioning care to new PT office in [facility]. She demonstrates continued improvement with single stance balance, dynamic gait, and weight bearing without experiencing increased pain. She is currently on 5th week of [facility] protocol and she has completed all tasks including beginning leg press for first time. She will continue to benefit from skilled PT and her records will be transferred to her new clinic.    Personal Factors and Comorbidities Age;Comorbidity 3+;Fitness;Past/Current  Experience;Profession;Time since onset of  injury/illness/exacerbation    Comorbidities Anxiety, AVM, high cholesterol. GERD, hypothyroidism.    Examination-Activity Limitations Squat;Lift;Stairs;Locomotion Level;Stand    Examination-Participation Restrictions Shop;Community Activity;Occupation    Stability/Clinical Decision Making Stable/Uncomplicated    Rehab Potential Good    PT Frequency 2x / week    PT Duration 8 weeks    PT Treatment/Interventions ADLs/Self Care Home Management;Aquatic Therapy;Cryotherapy;Electrical Stimulation;Moist Heat;DME Instruction;Contrast Bath;Gait training;Stair training;Functional mobility training;Therapeutic activities;Therapeutic exercise;Balance training;Neuromuscular re-education;Patient/family education;Manual techniques;Passive range of motion;Dry needling    PT Next Visit Plan Pt will transition to new provider and no longer be attending therapy at [facility] and Agree with Plan of Care Patient             Patient will benefit from skilled therapeutic intervention in order to improve the following deficits and impairments:  Decreased mobility, Decreased activity tolerance, Decreased range of motion, Decreased strength, Difficulty walking, Impaired flexibility  Visit Diagnosis: Stiffness of left foot, not elsewhere classified  Other abnormalities of gait and mobility     Problem List Patient Active Problem List   Diagnosis Date Noted   Fatty liver 65/53/7482   Umbilical hernia 70/78/6754   Stress incontinence 12/03/2020   Altered bowel habits    Tendinitis of upper biceps tendon of left shoulder 07/28/2019   Anxiety and depression 07/03/2019   Obstructive sleep apnea 07/03/2019   Subjective pulsatile tinnitus of right ear 07/03/2019   Nontraumatic incomplete tear of left rotator cuff 05/23/2019   Rotator cuff tendinitis, left 05/23/2019   Stress 12/22/2018   Loose stools 12/22/2018    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 [place], [address] Phone: [phone]   Fax:  [phone]  Name: Stephanie Shields MRN: [id number] Date of Birth: 10-May-1971

## 2021-06-06 ENCOUNTER — Ambulatory Visit: Payer: BC Managed Care – PPO | Admitting: Physical Therapy

## 2021-06-11 ENCOUNTER — Other Ambulatory Visit: Payer: Self-pay

## 2021-06-11 ENCOUNTER — Other Ambulatory Visit (INDEPENDENT_AMBULATORY_CARE_PROVIDER_SITE_OTHER): Payer: BC Managed Care – PPO

## 2021-06-11 DIAGNOSIS — E063 Autoimmune thyroiditis: Secondary | ICD-10-CM | POA: Diagnosis not present

## 2021-06-11 DIAGNOSIS — E6609 Other obesity due to excess calories: Secondary | ICD-10-CM | POA: Diagnosis not present

## 2021-06-11 DIAGNOSIS — K760 Fatty (change of) liver, not elsewhere classified: Secondary | ICD-10-CM | POA: Diagnosis not present

## 2021-06-11 DIAGNOSIS — Z6837 Body mass index (BMI) 37.0-37.9, adult: Secondary | ICD-10-CM

## 2021-06-11 DIAGNOSIS — F909 Attention-deficit hyperactivity disorder, unspecified type: Secondary | ICD-10-CM | POA: Diagnosis not present

## 2021-06-11 DIAGNOSIS — E038 Other specified hypothyroidism: Secondary | ICD-10-CM | POA: Diagnosis not present

## 2021-06-11 LAB — LIPID PANEL
Cholesterol: 158 mg/dL (ref 0–200)
HDL: 43 mg/dL (ref >39.00)
LDL Cholesterol: 91 mg/dL (ref 0–99)
NonHDL: 115.14
Total CHOL/HDL Ratio: 4
Triglycerides: 119 mg/dL (ref 0.0–149.0)
VLDL: 23.8 mg/dL (ref 0.0–40.0)

## 2021-06-11 LAB — COMPREHENSIVE METABOLIC PANEL
ALT: 50 U/L — ABNORMAL HIGH (ref 0–35)
AST: 37 U/L (ref 0–37)
Albumin: 4 g/dL (ref 3.5–5.2)
Alkaline Phosphatase: 45 U/L (ref 39–117)
BUN: 16 mg/dL (ref 6–23)
CO2: 25 mEq/L (ref 19–32)
Calcium: 8.5 mg/dL (ref 8.4–10.5)
Chloride: 104 mEq/L (ref 96–112)
Creatinine, Ser: 0.65 mg/dL (ref 0.40–1.20)
GFR: 104.42 mL/min (ref >60.00)
Glucose, Bld: 84 mg/dL (ref 70–99)
Potassium: 4 mEq/L (ref 3.5–5.1)
Sodium: 137 mEq/L (ref 135–145)
Total Bilirubin: 0.6 mg/dL (ref 0.2–1.2)
Total Protein: 6.4 g/dL (ref 6.0–8.3)

## 2021-06-11 LAB — HEMOGLOBIN A1C: Hgb A1c MFr Bld: 5.7 % (ref 4.6–6.5)

## 2021-06-11 LAB — TSH: TSH: 1.36 u[IU]/mL (ref 0.35–5.50)

## 2021-06-13 ENCOUNTER — Other Ambulatory Visit: Payer: Self-pay | Admitting: Family Medicine

## 2021-06-13 DIAGNOSIS — K760 Fatty (change of) liver, not elsewhere classified: Secondary | ICD-10-CM

## 2021-06-14 ENCOUNTER — Telehealth (INDEPENDENT_AMBULATORY_CARE_PROVIDER_SITE_OTHER): Payer: BC Managed Care – PPO | Admitting: Family Medicine

## 2021-06-14 ENCOUNTER — Other Ambulatory Visit: Payer: Self-pay

## 2021-06-14 ENCOUNTER — Encounter: Payer: Self-pay | Admitting: Family Medicine

## 2021-06-14 ENCOUNTER — Ambulatory Visit (INDEPENDENT_AMBULATORY_CARE_PROVIDER_SITE_OTHER): Payer: BC Managed Care – PPO | Admitting: Psychology

## 2021-06-14 DIAGNOSIS — J4520 Mild intermittent asthma, uncomplicated: Secondary | ICD-10-CM

## 2021-06-14 DIAGNOSIS — R531 Weakness: Secondary | ICD-10-CM | POA: Diagnosis not present

## 2021-06-14 DIAGNOSIS — E6609 Other obesity due to excess calories: Secondary | ICD-10-CM

## 2021-06-14 DIAGNOSIS — N926 Irregular menstruation, unspecified: Secondary | ICD-10-CM

## 2021-06-14 DIAGNOSIS — M722 Plantar fascial fibromatosis: Secondary | ICD-10-CM

## 2021-06-14 DIAGNOSIS — F4310 Post-traumatic stress disorder, unspecified: Secondary | ICD-10-CM | POA: Diagnosis not present

## 2021-06-14 DIAGNOSIS — K760 Fatty (change of) liver, not elsewhere classified: Secondary | ICD-10-CM | POA: Diagnosis not present

## 2021-06-14 DIAGNOSIS — E66812 Obesity, class 2: Secondary | ICD-10-CM

## 2021-06-14 DIAGNOSIS — M79672 Pain in left foot: Secondary | ICD-10-CM | POA: Diagnosis not present

## 2021-06-14 DIAGNOSIS — F439 Reaction to severe stress, unspecified: Secondary | ICD-10-CM

## 2021-06-14 DIAGNOSIS — R7303 Prediabetes: Secondary | ICD-10-CM

## 2021-06-14 DIAGNOSIS — F4323 Adjustment disorder with mixed anxiety and depressed mood: Secondary | ICD-10-CM | POA: Diagnosis not present

## 2021-06-14 DIAGNOSIS — Z6837 Body mass index (BMI) 37.0-37.9, adult: Secondary | ICD-10-CM

## 2021-06-14 DIAGNOSIS — J45909 Unspecified asthma, uncomplicated: Secondary | ICD-10-CM | POA: Insufficient documentation

## 2021-06-14 DIAGNOSIS — R41840 Attention and concentration deficit: Secondary | ICD-10-CM | POA: Insufficient documentation

## 2021-06-14 MED ORDER — AZELASTINE HCL 0.1 % NA SOLN: 1.0000 | Freq: Two times a day (BID) | NASAL | 3 refills | Status: DC

## 2021-06-14 MED ORDER — ALBUTEROL SULFATE HFA 108 (90 BASE) MCG/ACT IN AERS: 2.0000 | INHALATION_SPRAY | Freq: Four times a day (QID) | RESPIRATORY_TRACT | 0 refills | Status: DC | PRN

## 2021-06-14 NOTE — Assessment & Plan Note (Signed)
Patient with home stressors with loss of nursing care for her son.  I will check with our clinical pharmacist to see if she thinks this is an appropriate referral for chronic care management clinical social work.

## 2021-06-14 NOTE — Assessment & Plan Note (Signed)
I encouraged healthier dietary habits.  Discussed adding in some exercise as she is able to once she is released following her plantar fasciitis procedure.

## 2021-06-14 NOTE — Progress Notes (Signed)
Virtual Visit via video Note  This visit type was conducted due to national recommendations for restrictions regarding the COVID-19 pandemic (e.g. social distancing).  This format is felt to be most appropriate for this patient at this time.  All issues noted in this document were discussed and addressed.  No physical exam was performed (except for noted visual exam findings with Video Visits).   I connected with Stephanie Shields today at 11:30 AM EDT by a video enabled telemedicine application or telephone and verified that I am speaking with the correct person using two identifiers. Location patient: home Location provider: work Persons participating in the virtual visit: patient, provider  I discussed the limitations, risks, security and privacy concerns of performing an evaluation and management service by telephone and the availability of in person appointments. I also discussed with the patient that there may be a patient responsible charge related to this service. The patient expressed understanding and agreed to proceed.  Reason for visit: f/u  HPI: Fatty liver disease: Patient had recent LFTs that revealed an elevated ALT.  She notes no right upper quadrant pain.  No alcohol intake.  No Tylenol intake.  Obesity: Notes her diet has been all over the place recently.  They recently moved.  She is trying to eat more salads and vegetables.  Exercise has been difficult given issues with Plantar fasciitis though she is recently recovering from a procedure for this and thinks she may be able to get some exercise in the future.  Attention deficit: She is still in the process of having this evaluated.  She has 1 more appointment to get the results.  Irregular menstrual cycles: Patient notes she had her IUD out in October.  She did not have a menstrual cycle until January though had 1 in January and February and then had another 1 in May.  She notes no vision changes.  No headaches.  Reactive  airway disease: Patient notes this only bothers her if she gets ill typically during flu season.  She has had an albuterol inhaler in the past and notes that does help when she gets wheezy from this.  No issues at other times of the year.   The patient also notes some stressors at home as they lost their nursing support for her son who just had his trach taken out.  They do have access to case manager.  She does note that her and her husband have had to split time overnight monitoring her son.  ROS: See pertinent positives and negatives per HPI.  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   Plaques on brainstem, followed by neurology   Elevated cholesterol    GERD (gastroesophageal reflux disease)    History of blood transfusion    Hypothyroidism    Low blood pressure    Pre-diabetes    Urinary incontinence    After delivery of her recent baby    Past Surgical History:  Procedure Laterality Date   BICEPT TENODESIS  07/28/2019   Procedure: MINI OPEN BICEPS TENODESIS;  Surgeon: Corky Mull, MD;  Location: [facility] ORS;  Service: Orthopedics;;   BLADDER SURGERY     as a child   COLONOSCOPY WITH PROPOFOL N/A 02/01/2020   Procedure: COLONOSCOPY WITH PROPOFOL;  Surgeon: Lin Landsman, MD;  Location:  [facility] ENDOSCOPY;  Service: Gastroenterology;  Laterality: N/A;   ESOPHAGOGASTRODUODENOSCOPY (EGD) WITH PROPOFOL N/A 02/01/2020   Procedure: ESOPHAGOGASTRODUODENOSCOPY (EGD) WITH PROPOFOL;  Surgeon: Lin Landsman, MD;  Location[facility];  Service: Gastroenterology;  Laterality: N/A;   IR ANGIO INTRA EXTRACRAN SEL COM CAROTID INNOMINATE BILAT MOD SED  08/12/2019   IR ANGIO VERTEBRAL SEL VERTEBRAL BILAT MOD SED  08/12/2019   IR ANGIOGRAM EXTREMITY LEFT  08/12/2019   IR US GUIDE VASC ACCESS RIGHT  08/12/2019   MOUTH  SURGERY     NASAL SEPTUM SURGERY     REFRACTIVE SURGERY     SHOULDER ARTHROSCOPY WITH SUBACROMIAL DECOMPRESSION, ROTATOR CUFF REPAIR AND BICEP TENDON REPAIR Left 07/28/2019   Procedure: SHOULDER ARTHROSCOPY WITH, DEBIRDEMENT, SUBACROMIAL DECOMPRESSION, MINI OPEN ROTATOR CUFF REPAIR;  Surgeon: Corky Mull, MD;  Location: [facility] ORS;  Service: Orthopedics;  Laterality: Left;   TONSILLECTOMY      Family History  Problem Relation Age of Onset   Arthritis Other        Parent, Grandparent   Prostate cancer Other        Grandparent   Hyperlipidemia Other        Parent   Heart disease Other        Grandparent   Stroke Other        Grandparent   Diabetes Other        Parent    SOCIAL HX: Non-smoker   Current Outpatient Medications:    albuterol (VENTOLIN HFA) 108 (90 Base) MCG/ACT inhaler, Inhale 2 puffs into the lungs every 6 (six) hours as needed for wheezing or shortness of breath., Disp: 8 g, Rfl: 0   Cholecalciferol (VITAMIN D) 50 MCG (2000 UT) tablet, Take 2,000 Units by mouth daily at 3 pm., Disp: , Rfl:    levothyroxine (SYNTHROID) 150 MCG tablet, TAKE 1 TABLET (150 MCG TOTAL) BY MOUTH DAILY BEFORE BREAKFAST., Disp: 90 tablet, Rfl: 3   Probiotic CAPS, , Disp: , Rfl:    azelastine (ASTELIN) 0.1 % nasal spray, Place 1-2 sprays into both nostrils 2 (two) times daily. Use in each nostril as directed, Disp: 30 mL, Rfl: 3   famotidine (PEPCID) 20 MG tablet, Take 20 mg by mouth 2 (two) times daily. (Patient not taking: Reported on 06/14/2021), Disp: , Rfl:   EXAM:  VITALS per patient if applicable:  GENERAL: alert, oriented, appears well and in no acute distress  HEENT: atraumatic, conjunttiva clear, no obvious abnormalities on inspection of external nose and ears  NECK: normal movements of the head and neck  LUNGS: on inspection no signs of respiratory distress, breathing rate appears normal, no obvious gross SOB, gasping or wheezing  CV: no obvious cyanosis  MS: moves all  visible extremities without noticeable abnormality  PSYCH/NEURO: pleasant and cooperative, no obvious depression or anxiety, speech and thought processing grossly intact  ASSESSMENT AND PLAN:  Discussed the following assessment and plan:  Problem List Items Addressed This Visit     Attention deficit    She will complete her evaluation and then treatment will be determined if appropriate.       Fatty liver    ALT elevated.  Discussed rechecking in several weeks.  Discussed diet and exercise as treatment for fatty liver disease though if her LFTs remain elevated we did discuss having her see GI for further evaluation.       Irregular menses    Likely perimenopausal though I did encourage the patient to discuss this with her  gynecologist.       Obesity    I encouraged healthier dietary habits.  Discussed adding in some exercise as she is able to once she is released following her plantar fasciitis procedure.       Plantar fasciitis    She will complete physical therapy through sports medicine.       Prediabetes    Encouraged healthy diet and exercise.       Reactive airway disease    This only bothers her when she has a respiratory illness.  We will provide her with an albuterol inhaler to have on hand though I did advise that if she develops an illness to progressively worsen she needs to be evaluated.        Return in about 1 month (around 07/15/2021) for labs, 3 months fatty .   I discussed the assessment and treatment plan with the patient. The patient was provided an opportunity to ask questions and all were answered. The patient agreed with the plan and demonstrated an understanding of the instructions.   The patient was advised to call back or seek an in-person evaluation if the symptoms worsen or if the condition fails to improve as anticipated.  The patient just moved to [facility].  She will plan to continue to follow with me for the time being though she  will be looking for a new primary care provider closer to home.  [name], MD

## 2021-06-14 NOTE — Assessment & Plan Note (Signed)
She will complete her evaluation and then treatment will be determined if appropriate.

## 2021-06-14 NOTE — Assessment & Plan Note (Signed)
Likely perimenopausal though I did encourage the patient to discuss this with her gynecologist.

## 2021-06-14 NOTE — Assessment & Plan Note (Signed)
This only bothers her when she has a respiratory illness.  We will provide her with an albuterol inhaler to have on hand though I did advise that if she develops an illness to progressively worsen she needs to be evaluated.

## 2021-06-14 NOTE — Assessment & Plan Note (Signed)
She will complete physical therapy through sports medicine.

## 2021-06-14 NOTE — Assessment & Plan Note (Signed)
Encouraged healthy diet and exercise

## 2021-06-14 NOTE — Assessment & Plan Note (Signed)
ALT elevated.  Discussed rechecking in several weeks.  Discussed diet and exercise as treatment for fatty liver disease though if her LFTs remain elevated we did discuss having her see GI for further evaluation.

## 2021-06-18 ENCOUNTER — Telehealth: Payer: Self-pay

## 2021-06-18 DIAGNOSIS — D2262 Melanocytic nevi of left upper limb, including shoulder: Secondary | ICD-10-CM | POA: Diagnosis not present

## 2021-06-18 DIAGNOSIS — D2261 Melanocytic nevi of right upper limb, including shoulder: Secondary | ICD-10-CM | POA: Diagnosis not present

## 2021-06-18 DIAGNOSIS — D2272 Melanocytic nevi of left lower limb, including hip: Secondary | ICD-10-CM | POA: Diagnosis not present

## 2021-06-18 DIAGNOSIS — D225 Melanocytic nevi of trunk: Secondary | ICD-10-CM | POA: Diagnosis not present

## 2021-06-18 NOTE — Chronic Care Management (AMB) (Signed)
  Care Management   Note  06/18/2021 Name: Stephanie Shields MRN: [id number] DOB: [birth year]  Stephanie Shields is a 50 y.o. year old female who is a primary care patient of Caryl Bis, Angela Adam, MD. I reached out to Lenoria Farrier by phone today in response to a referral sent by Ms. Renaye Rakers Krinke's health plan.    Ms. Pack was given information about care management services today including:  Care management services include personalized support from designated clinical staff supervised by her physician, including individualized plan of care and coordination with other care providers 24/7 contact phone numbers for assistance for urgent and routine care needs. The patient may stop care management services at any time by phone call to the office staff.  Patient agreed to services and verbal consent obtained.   Follow up plan: Telephone appointment with care management team member scheduled for:06/25/2021  Noreene Larsson, [place], [place],  [address] Direct Dial: [phone] [email or web address]

## 2021-06-18 NOTE — Progress Notes (Signed)
Patient has been scheduled

## 2021-06-21 DIAGNOSIS — M722 Plantar fascial fibromatosis: Secondary | ICD-10-CM | POA: Diagnosis not present

## 2021-06-21 DIAGNOSIS — M79672 Pain in left foot: Secondary | ICD-10-CM | POA: Diagnosis not present

## 2021-06-21 DIAGNOSIS — R531 Weakness: Secondary | ICD-10-CM | POA: Diagnosis not present

## 2021-06-25 ENCOUNTER — Ambulatory Visit: Payer: BC Managed Care – PPO | Admitting: *Deleted

## 2021-06-25 DIAGNOSIS — M79672 Pain in left foot: Secondary | ICD-10-CM | POA: Diagnosis not present

## 2021-06-25 DIAGNOSIS — F32A Depression, unspecified: Secondary | ICD-10-CM

## 2021-06-25 DIAGNOSIS — F439 Reaction to severe stress, unspecified: Secondary | ICD-10-CM

## 2021-06-25 DIAGNOSIS — J4520 Mild intermittent asthma, uncomplicated: Secondary | ICD-10-CM

## 2021-06-25 DIAGNOSIS — Z6837 Body mass index (BMI) 37.0-37.9, adult: Secondary | ICD-10-CM

## 2021-06-25 DIAGNOSIS — R531 Weakness: Secondary | ICD-10-CM | POA: Diagnosis not present

## 2021-06-25 DIAGNOSIS — M722 Plantar fascial fibromatosis: Secondary | ICD-10-CM | POA: Diagnosis not present

## 2021-06-25 DIAGNOSIS — F419 Anxiety disorder, unspecified: Secondary | ICD-10-CM

## 2021-06-25 DIAGNOSIS — G378 Other specified demyelinating diseases of central nervous system: Secondary | ICD-10-CM

## 2021-06-25 DIAGNOSIS — R41840 Attention and concentration deficit: Secondary | ICD-10-CM

## 2021-06-25 DIAGNOSIS — E6609 Other obesity due to excess calories: Secondary | ICD-10-CM

## 2021-06-25 NOTE — Patient Instructions (Signed)
Visit Information   PATIENT GOALS:   Goals Addressed               This Visit's Progress     Find Help in My Community to Provide Care for My Son. (pt-stated)   On track     Timeframe:  Short-Term Goal Priority:  High Start Date:  06/25/2021                         Expected End Date:  08/26/2021              Follow-Up Date: 07/03/2021 at 11:30am.  Patient Goals/Self-Care Activities:  Contact Medicaid CAP-C Waiver Program to inquire about Case Management and Caregiver Services for 48-year-old exceptional son. Contact [facility] to inquire about services for 50-year-old exceptional son. Contact [employer] to inquire about school and after-school care programs for exceptional children.    [employer] to inquire about Publishing rights manager for 50-year-old exceptional son. Consider referral to [facility] for family counseling services, in the form of Parent/Child Interaction Therapy. Consider referral to [facility] for ongoing individual mental health counseling and supportive services.   Work with [facility] on a weekly basis to receive counseling, supportive services and referrals to [employer] and resources.          Consent to CCM Services: Ms. Denunzio was given information about Chronic Care Management services today including:  CCM service includes personalized support from designated clinical staff supervised by her physician, including individualized plan of care and coordination with other care providers 24/7 contact phone numbers for assistance for urgent and routine care needs. Service will only be billed when office clinical staff spend 20 minutes or more in a month to coordinate care. Only one practitioner may furnish and bill the service in a calendar month. The patient may stop CCM services at any time (effective at the end of the month) by phone call to the office staff. The patient will be responsible for cost sharing  (co-pay) of up to 20% of the service fee (after annual deductible is met).  Patient agreed to services and verbal consent obtained.   Patient verbalizes understanding of instructions provided today and agrees to view in [place].   Telephone follow-up appointment with care management team member scheduled for:  07/03/2021 at 11:30am.  Nat Christen LCSW Licensed Clinical Social Worker [place]  [phone]   CLINICAL CARE PLAN: Patient Care Plan: LCSW Plan of Care.     Problem Identified: Find Help in My Community to Provide Care for My Son.   Priority: High     Goal: Find Help in My Community to Provide Care for My Son.   Start Date: 06/25/2021  Expected End Date: 08/26/2021  This Visit's Progress: On track  Priority: High  Note:   Current barriers:   Patient is in need of assistance with connecting to community resources that can offer respite care to her and her husband during nighttime hours. Patient is in need of assistance with arranging in-home care services for her 48-year-old exceptional son during nighttime hours.   Financial constraints related to inability to pay for respite/in-home care services out-of-pocket.   Limited access to nighttime caregiver for 50-year-old exceptional son that currently requires 24 hour care and supervision.   Patient acknowledges deficits with meeting this unmet need. Patient is unable to independently navigate community resource options without care coordination and support. Clinical Goals:  Over the next  45-60 days, patient will work with LCSW to address concerns related to in-home caregiver support for 50-year-old exceptional son. Patient will work with LCSW to address needs related to having in-home care support during nighttime hours so that patient and husband are able to sleep. Patient will follow-up with her 72-year-old exceptional son's cardiologist on 06/27/2021 to learn more about her son's symptoms and possible treatment  options. Clinical Interventions:  Collaboration with Leone Haven, MD regarding development and update of comprehensive plan of care as evidenced by provider attestation and co-signature. Inter-disciplinary care team collaboration (see longitudinal plan of care). Assessment of needs, barriers, agencies contacted, as well as how impacting. Reviewed various resources, discussed options and provided patient with information about the Medicaid CAP-C Waiver Program, Case Management and Caregiver Services. Reviewed various resources, discussed options and provided patient with information about the [place] Office manager for Exceptional Children). Reviewed various resources, discussed options and provided patient with information about [employer], research (medical) (school and after-school care program for exceptional children).    Reviewed various resources, discussed options and provided patient with information about [place], through [employer]. Reviewed various resources, discussed options and provided patient with information about family counseling services through Parent/Child Interaction Therapy and individual counseling through [facility]. Collaborated with appropriate clinical care team members regarding patient needs.   Patient interviewed and appropriate assessments performed. Provided mental health counseling with regards to stress around providing 24 hour care and supervision for 49-year-old exceptional son. Discussed plans with patient for ongoing care management follow-up and provided patient with direct contact information for care management team. 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 Assisted patient with obtaining information about health plan benefits through [facility] and [place] (insurance for 50-year-old exceptional  son). Other interventions provided: PHQ 2 and PHQ 9 Depression Screen Completed and Results Reviewed, Solution-Focused Strategies Implemented, Deep Breathing Exercises, Relaxation Techniques and Mindfulness Meditation Strategies Taught and Encouraged, Active Listening/Reflection Utilized, Emotional Support Provided, Problem Solving/Task-Centered Skills Utilized, Quality of Sleep Assessed and Sleep Hygiene Techniques Promoted, Caregiver Stress Acknowledged, Participation in Counseling Encouraged, Participation in Support Group Discussed, Consideration of In-Home Help Emphasized, Verbalization of Feelings Encouraged, and Discussion of Referral to [place] to Connect to Ongoing [place] and Supportive Services.   Patient Goals/Self-Care Activities:  Contact Medicaid CAP-C Waiver Program to inquire about Case Management and Caregiver Services for 51-year-old exceptional son. Contact [facility] to inquire about services for 54-year-old exceptional son. Contact [employer] to inquire about school and after-school care programs for exceptional children.    [employer] to inquire about Publishing rights manager for 44-year-old exceptional son. Consider referral to [facility] [facility] for family counseling services, in the form of Parent/Child Interaction Therapy. Consider referral to [facility] for ongoing individual mental health counseling and supportive services.   Work with [facility] on a weekly basis to receive counseling, supportive services and referrals to [employer] and resources. Follow-Up Plan:  LCSW will follow-up with patient by phone on 07/03/2021 at 11:30am.

## 2021-06-25 NOTE — Chronic Care Management (AMB) (Signed)
Care Management Clinical Social Work Note  06/25/2021 Name: Stephanie Shields MRN: [id number] DOB: 16-Dec-1970  Stephanie Shields is a 50 y.o. year old female who is a primary care patient of Stephanie Shields, Stephanie Adam, MD.  The Care Management team was consulted for assistance with chronic disease management and coordination needs.  Engaged with patient by telephone for initial visit in response to provider referral for social work chronic care management and care coordination services  Consent to Services:  Ms. Waage was given information about Care Management services today including:  Care Management services includes personalized support from designated clinical staff supervised by her physician, including individualized plan of care and coordination with other care providers 24/7 contact phone numbers for assistance for urgent and routine care needs. The patient may stop case management services at any time by phone call to the office staff.  Patient agreed to services and consent obtained.   Assessment: Review of patient past medical history, allergies, medications, and health status, including review of relevant consultants reports was performed today as part of a comprehensive evaluation and provision of chronic care management and care coordination services.  SDOH (Social Determinants of Health) assessments and interventions performed:  SDOH Interventions    Flowsheet Row Most Recent Value  SDOH Interventions   Food Insecurity Interventions Intervention Not Indicated  Financial Strain Interventions Intervention Not Indicated  Housing Interventions Intervention Not Indicated  Intimate Partner Violence Interventions Intervention Not Indicated  Physical Activity Interventions Intervention Not Indicated, Patient Refused  Stress Interventions Provide Counseling  Social Connections Interventions Intervention Not Indicated  Transportation Interventions Intervention Not Indicated        Advanced  Directives Status: Not addressed in this encounter.  Care Plan  Allergies  Allergen Reactions   Sulfa Antibiotics Nausea And Vomiting   Lobster [Shellfish Allergy] Nausea And Vomiting   Other Nausea And Vomiting    Outpatient Encounter Medications as of 06/25/2021  Medication Sig   albuterol (VENTOLIN HFA) 108 (90 Base) MCG/ACT inhaler Inhale 2 puffs into the lungs every 6 (six) hours as needed for wheezing or shortness of breath.   azelastine (ASTELIN) 0.1 % nasal spray Place 1-2 sprays into both nostrils 2 (two) times daily. Use in each nostril as directed   Cholecalciferol (VITAMIN D) 50 MCG (2000 UT) tablet Take 2,000 Units by mouth daily at 3 pm.   famotidine (PEPCID) 20 MG tablet Take 20 mg by mouth 2 (two) times daily. (Patient not taking: Reported on 06/14/2021)   levothyroxine (SYNTHROID) 150 MCG tablet TAKE 1 TABLET (150 MCG TOTAL) BY MOUTH DAILY BEFORE BREAKFAST.   Probiotic CAPS    No facility-administered encounter medications on file as of 06/25/2021.    Patient Active Problem List   Diagnosis Date Noted   Reactive airway disease 06/14/2021   Irregular menses 06/14/2021   Attention deficit 06/14/2021   Plantar fasciitis 06/14/2021   Fatty liver 99991111   Umbilical hernia 99991111   Stress incontinence 12/03/2020   Altered bowel habits    Tendinitis of upper biceps tendon of left shoulder 07/28/2019   Anxiety and depression 07/03/2019   Obstructive sleep apnea 07/03/2019   Subjective pulsatile tinnitus of right ear 07/03/2019   Nontraumatic incomplete tear of left rotator cuff 05/23/2019   Rotator cuff tendinitis, left 05/23/2019   Stress 12/22/2018   Loose stools 12/22/2018   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  Limited social support, limited access to caregiver and caregiver stress.  Care Plan : LCSW Plan of Care.  Updates made by Francis Gaines, LCSW since 06/25/2021 12:00 AM     Problem: Find Help in My Community to Provide Care for My Son.   Priority: High     Goal: Find Help in My Community to Provide Care for My Son.   Start Date: 06/25/2021  Expected End Date: 08/26/2021  This Visit's Progress: On track  Priority: High  Note:   Current barriers:   Patient is in need of assistance with connecting to community resources that can offer respite care to her and her husband during nighttime hours. Patient is in need of assistance with arranging in-home care services for her 95-year-old exceptional son during nighttime hours.   Financial constraints related to inability to pay for respite/in-home care services out-of-pocket.   Limited access to nighttime caregiver for 22-year-old exceptional son that currently requires 24 hour care and supervision.   Patient acknowledges deficits with meeting this unmet need. Patient is unable to independently navigate community resource options without care coordination and support. Clinical Goals:  Over the next 45-60 days, patient will work with LCSW to address concerns related to in-home caregiver support for 31-year-old exceptional son. Patient will work with LCSW to address needs related to having in-home care support during nighttime hours so that patient and husband are able to sleep. Patient will follow-up with her 64-year-old exceptional son's cardiologist on 06/27/2021 to learn more about her son's symptoms and possible treatment options. Clinical Interventions:  Collaboration with Leone Haven, MD regarding development and update of comprehensive  plan of care as evidenced by provider attestation and co-signature. Inter-disciplinary care team collaboration (see longitudinal plan of care). Assessment of needs, barriers, agencies contacted, as well as how impacting. Reviewed various resources, discussed options and provided patient with information about the Medicaid CAP-C Waiver Program, Case Management and Caregiver Services. Reviewed various resources, discussed options and provided patient with information about the [place] Office manager for Exceptional Children). Reviewed various resources, discussed options and provided patient with information about [employer], research (medical) (school and after-school care program for exceptional children).    Reviewed various resources, discussed options and provided patient with information about [place], through [employer]. Reviewed various resources, discussed options and provided patient with information about family counseling services through Parent/Child Interaction Therapy and individual counseling through [place]. Collaborated with appropriate clinical care team members regarding patient needs.   Patient interviewed and appropriate assessments performed. Provided mental health counseling with regards to stress around providing 24 hour care and supervision for 24-year-old exceptional son. Discussed plans with patient for ongoing care management follow-up and provided patient with direct contact information for care management team. 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 Assisted patient with obtaining information about health plan benefits through [facility] and [place] (insurance for 89-year-old exceptional son). Other interventions provided: PHQ 2 and PHQ 9 Depression Screen Completed and Results Reviewed, Solution-Focused  Strategies Implemented, Deep Breathing Exercises, Relaxation Techniques and Mindfulness Meditation Strategies Taught and Encouraged, Active Listening/Reflection Utilized, Emotional Support Provided, Problem Solving/Task-Centered  Skills Utilized, Quality of Sleep Assessed and Sleep Hygiene Techniques Promoted, Caregiver Stress Acknowledged, Participation in Counseling Encouraged, Participation in Support Group Discussed, Consideration of In-Home Help Emphasized, Verbalization of Feelings Encouraged, and Discussion of Referral to [place] to Connect to Ongoing Mental Health Counseling and Supportive Services.   Patient Goals/Self-Care Activities:  Contact Medicaid CAP-C Waiver Program to inquire about Case Management and Caregiver Services for 80-year-old exceptional son. Contact [facility] to inquire about services for 84-year-old exceptional son. Contact [employer] to inquire about school and after-school care programs for exceptional children.    [employer] to inquire about Publishing rights manager for 27-year-old exceptional son. Consider referral to [place] for family counseling services, in the form of Parent/Child Interaction Therapy. Consider referral to [place] for ongoing individual mental health counseling and supportive services.   Work with [facility] on a weekly basis to receive counseling, supportive services and referrals to [employer] and resources. Follow-Up Plan:  LCSW will follow-up with patient by phone on 07/03/2021 at 11:30am.      Follow Up Plan:  LCSW will follow-up with patient by phone on 07/03/2021 at 11:30am.  Nat Christen LCSW Licensed Clinical Social Worker [place]  [phone]

## 2021-06-26 DIAGNOSIS — F909 Attention-deficit hyperactivity disorder, unspecified type: Secondary | ICD-10-CM | POA: Diagnosis not present

## 2021-07-01 DIAGNOSIS — M79672 Pain in left foot: Secondary | ICD-10-CM | POA: Diagnosis not present

## 2021-07-01 DIAGNOSIS — R531 Weakness: Secondary | ICD-10-CM | POA: Diagnosis not present

## 2021-07-01 DIAGNOSIS — M722 Plantar fascial fibromatosis: Secondary | ICD-10-CM | POA: Diagnosis not present

## 2021-07-03 ENCOUNTER — Ambulatory Visit: Payer: BC Managed Care – PPO | Admitting: *Deleted

## 2021-07-03 DIAGNOSIS — F419 Anxiety disorder, unspecified: Secondary | ICD-10-CM

## 2021-07-03 DIAGNOSIS — F32A Depression, unspecified: Secondary | ICD-10-CM

## 2021-07-03 DIAGNOSIS — R41840 Attention and concentration deficit: Secondary | ICD-10-CM

## 2021-07-03 DIAGNOSIS — F439 Reaction to severe stress, unspecified: Secondary | ICD-10-CM

## 2021-07-03 NOTE — Patient Instructions (Signed)
Visit Information  PATIENT GOALS:  Goals Addressed               This Visit's Progress     Find Help in My Community to Provide Care for My Son. (pt-stated)   On track     Timeframe:  Short-Term Goal Priority:  High Start Date:  06/25/2021                         Expected End Date:  08/26/2021              Follow-Up Date: 07/10/2021 at 12:30pm.  Patient Goals/Self-Care Activities:  Contact Medicaid CAP-C Waiver Program to inquire about Case Management and Caregiver Services for 62-year-old exceptional son. Contact [facility] to inquire about services for 6-year-old exceptional son. Contact [employer] to inquire about school and after-school care programs for exceptional children.    [employer] to inquire about Publishing rights manager for 90-year-old exceptional son. Verbal consent obtained to place referral to [facility] for ongoing family counseling and supportive services, in the form of Parent/Child Interaction Therapy. Verbal consent obtained to place referral to [facility] for ongoing individual mental health counseling and supportive services, in the form of Cognitive Behavioral Therapy.   Work with [facility] on a weekly/bi-weekly basis to receive counseling and supportive services, until established with [employer].           Patient verbalizes understanding of instructions provided today and agrees to view in [place].   Follow-Up Date:  07/10/2021 at 12:30pm.  Nat Christen LCSW Licensed Clinical Social Worker [place]  [phone]

## 2021-07-03 NOTE — Chronic Care Management (AMB) (Signed)
Care Management Clinical Social Work Note  07/03/2021 Name: Stephanie Shields MRN: [id number] DOB: 13-Oct-1971  Lenoria Farrier is a 50 y.o. year old female who is a primary care patient of Caryl Bis, Angela Adam, MD.  The Care Management team was consulted for assistance with chronic disease management and coordination needs.  Engaged with patient by telephone for follow-up visit in response to provider referral for social work chronic care management and care coordination services  Consent to Services:  Ms. Panetta was given information about Care Management services today including:  Care Management services includes personalized support from designated clinical staff supervised by her physician, including individualized plan of care and coordination with other care providers 24/7 contact phone numbers for assistance for urgent and routine care needs. The patient may stop case management services at any time by phone call to the office staff.  Patient agreed to services and consent obtained.   Assessment: Review of patient past medical history, allergies, medications, and health status, including review of relevant consultants reports was performed today as part of a comprehensive evaluation and provision of chronic care management and care coordination services.  SDOH (Social Determinants of Health) assessments and interventions performed:    Advanced Directives Status: Not addressed in this encounter.  Care Plan  Allergies  Allergen Reactions   Sulfa Antibiotics Nausea And Vomiting   Lobster [Shellfish Allergy] Nausea And Vomiting   Other Nausea And Vomiting    Outpatient Encounter Medications as of 07/03/2021  Medication Sig   albuterol (VENTOLIN HFA) 108 (90 Base) MCG/ACT inhaler Inhale 2 puffs into the lungs every 6 (six) hours as needed for wheezing or shortness of breath.   azelastine (ASTELIN) 0.1 % nasal spray Place 1-2 sprays into both nostrils 2 (two) times daily. Use in each nostril  as directed   Cholecalciferol (VITAMIN D) 50 MCG (2000 UT) tablet Take 2,000 Units by mouth daily at 3 pm.   famotidine (PEPCID) 20 MG tablet Take 20 mg by mouth 2 (two) times daily. (Patient not taking: Reported on 06/14/2021)   levothyroxine (SYNTHROID) 150 MCG tablet TAKE 1 TABLET (150 MCG TOTAL) BY MOUTH DAILY BEFORE BREAKFAST.   Probiotic CAPS    No facility-administered encounter medications on file as of 07/03/2021.    Patient Active Problem List   Diagnosis Date Noted   Reactive airway disease 06/14/2021   Irregular menses 06/14/2021   Attention deficit 06/14/2021   Plantar fasciitis 06/14/2021   Fatty liver 99991111   Umbilical hernia 99991111   Stress incontinence 12/03/2020   Altered bowel habits    Tendinitis of upper biceps tendon of left shoulder 07/28/2019   Anxiety and depression 07/03/2019   Obstructive sleep apnea 07/03/2019   Subjective pulsatile tinnitus of right ear 07/03/2019   Nontraumatic incomplete tear of left rotator cuff 05/23/2019   Rotator cuff tendinitis, left 05/23/2019   Stress 12/22/2018   Loose stools 12/22/2018   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  Limited Access to Caregiver. Care Plan : LCSW Plan of Care.  Updates made by Francis Gaines, LCSW since 07/03/2021 12:00 AM  Problem: Find Help in My Community to Provide Care for My Son.   Priority: High     Goal: Find Help in My Community to Provide Care for My Son.   Start Date: 06/25/2021  Expected End Date: 08/26/2021  This Visit's  Progress: On track  Recent Progress: On track  Priority: High  Note:   Current Barriers:   Patient is in need of assistance with connecting to community resources that can offer respite care to her and her husband during nighttime hours. Patient is in need of assistance with arranging in-home care services for her 63-year-old exceptional son during nighttime hours.   Financial constraints related to inability to pay for respite/in-home care services out-of-pocket.   Limited access to nighttime caregiver for 66-year-old exceptional son that currently requires 24 hour care and supervision.   Patient acknowledges deficits with meeting this unmet need. Patient is unable to independently navigate community resource options without care coordination and support. Clinical Goals:  Over the next 45-60 days, patient will work with LCSW to address concerns related to in-home caregiver support for 46-year-old exceptional son. Patient will work with LCSW to address needs related to having in-home care support during nighttime hours so that patient and husband are able to sleep. Patient will follow-up with her 71-year-old exceptional son's cardiologist on 06/27/2021 to learn more about her son's symptoms and possible treatment options. Clinical Interventions:  Collaboration with Leone Haven, MD regarding development and update of comprehensive plan of care as evidenced by provider attestation and co-signature. Inter-disciplinary care team collaboration (see longitudinal plan of care). Assessment of needs, barriers, agencies contacted, as well as how impacting. Reviewed various resources, discussed options and provided patient with information about the Medicaid CAP-C Waiver Program, Case Management and Caregiver Services. Reviewed various resources, discussed options and provided patient with information about the [place] Office manager for Exceptional Children). Reviewed various resources, discussed options and  provided patient with information about [employer], research (medical) (school and after-school care program for exceptional children).    Reviewed various resources, discussed options and provided patient with information about [place], through [employer]. Reviewed various resources, discussed options and provided patient with information about family counseling services through Parent/Child Interaction Therapy and individual counseling through [place]. Collaborated with appropriate clinical care team members regarding patient needs.   Patient interviewed and appropriate assessments performed. Provided mental health counseling with regards to stress around providing 24 hour care and supervision for 45-year-old exceptional son. Discussed plans with patient for ongoing care management follow-up and provided patient with direct contact information for care management team. 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 Assisted patient with obtaining information about health plan benefits through [facility] and [place] (insurance for 70-year-old exceptional son). Other interventions provided: PHQ 2 and PHQ 9 Depression Screen Completed and Results Reviewed, Solution-Focused Strategies Implemented, Deep Breathing Exercises, Relaxation Techniques and Mindfulness Meditation Strategies Taught and Encouraged, Active Listening/Reflection Utilized, Emotional Support Provided, Problem Solving/Task-Centered Skills Utilized, Quality of Sleep Assessed and Sleep Hygiene Techniques Promoted, Caregiver Stress Acknowledged, Participation in Counseling Encouraged, Participation in Support Group Discussed, Consideration of In-Home Help Emphasized, Verbalization of Feelings Encouraged, and Discussion of Referral to [place] to Connect to Ongoing [place] and Supportive Services.   Patient Goals/Self-Care Activities:  Contact Medicaid CAP-C Waiver Program to inquire about Case Management and Caregiver Services for 74-year-old exceptional son. Contact [facility]  to inquire about services for 79-year-old exceptional son. Contact [employer] to inquire about school and after-school care programs for exceptional children.    [employer] to inquire about Publishing rights manager for 24-year-old exceptional son. Verbal consent obtained to place referral to [facility] for ongoing family counseling and supportive services, in the form of Parent/Child Interaction Therapy. Verbal consent obtained to place referral to [facility] for ongoing individual mental health counseling and supportive services, in the form of Cognitive Behavioral Therapy.   Work with [employer] on a weekly/bi-weekly basis to receive counseling and supportive services, until established with [employer].   Follow-Up Plan:  LCSW will follow-up with patient by phone on 07/10/2021 at 12:30pm.      Follow-Up Plan:  LCSW will follow-up with patient by phone on 07/10/2021 at 12:30pm.  Nat Christen LCSW Licensed Clinical Social Worker [place]  [phone]

## 2021-07-05 ENCOUNTER — Other Ambulatory Visit: Payer: BC Managed Care – PPO

## 2021-07-09 ENCOUNTER — Other Ambulatory Visit: Payer: Self-pay | Admitting: Family Medicine

## 2021-07-10 ENCOUNTER — Ambulatory Visit: Payer: BC Managed Care – PPO | Admitting: *Deleted

## 2021-07-10 DIAGNOSIS — R41840 Attention and concentration deficit: Secondary | ICD-10-CM

## 2021-07-10 DIAGNOSIS — F32A Depression, unspecified: Secondary | ICD-10-CM

## 2021-07-10 DIAGNOSIS — E6609 Other obesity due to excess calories: Secondary | ICD-10-CM

## 2021-07-10 DIAGNOSIS — G378 Other specified demyelinating diseases of central nervous system: Secondary | ICD-10-CM

## 2021-07-10 DIAGNOSIS — F419 Anxiety disorder, unspecified: Secondary | ICD-10-CM

## 2021-07-10 DIAGNOSIS — F439 Reaction to severe stress, unspecified: Secondary | ICD-10-CM

## 2021-07-10 NOTE — Chronic Care Management (AMB) (Signed)
Care Management Clinical Social Work Note  07/10/2021 Name: Stephanie Shields MRN: [id number] DOB: 17-Aug-1971  Stephanie Shields is a 50 y.o. year old female who is a primary care patient of Caryl Bis, Angela Adam, MD.  The Care Management team was consulted for assistance with chronic disease management and coordination needs.  Engaged with patient by telephone for follow-up visit in response to provider referral for social work chronic care management and care coordination services  Consent to Services:  Ms. Ferreras was given information about Care Management services today including:  Care Management services includes personalized support from designated clinical staff supervised by her physician, including individualized plan of care and coordination with other care providers 24/7 contact phone numbers for assistance for urgent and routine care needs. The patient may stop case management services at any time by phone call to the office staff.  Patient agreed to services and consent obtained.   Assessment: Review of patient past medical history, allergies, medications, and health status, including review of relevant consultants reports was performed today as part of a comprehensive evaluation and provision of chronic care management and care coordination services.  SDOH (Social Determinants of Health) assessments and interventions performed:    Advanced Directives Status: Not addressed in this encounter.  Care Plan  Allergies  Allergen Reactions   Sulfa Antibiotics Nausea And Vomiting   Lobster [Shellfish Allergy] Nausea And Vomiting   Other Nausea And Vomiting    Outpatient Encounter Medications as of 07/10/2021  Medication Sig   albuterol (VENTOLIN HFA) 108 (90 Base) MCG/ACT inhaler Inhale 2 puffs into the lungs every 6 (six) hours as needed for wheezing or shortness of breath.   azelastine (ASTELIN) 0.1 % nasal spray Place 1-2 sprays into both nostrils 2 (two) times daily. Use in each nostril  as directed   Cholecalciferol (VITAMIN D) 50 MCG (2000 UT) tablet Take 2,000 Units by mouth daily at 3 pm.   famotidine (PEPCID) 20 MG tablet Take 20 mg by mouth 2 (two) times daily. (Patient not taking: Reported on 06/14/2021)   levothyroxine (SYNTHROID) 150 MCG tablet TAKE 1 TABLET (150 MCG TOTAL) BY MOUTH DAILY BEFORE BREAKFAST.   Probiotic CAPS    No facility-administered encounter medications on file as of 07/10/2021.    Patient Active Problem List   Diagnosis Date Noted   Reactive airway disease 06/14/2021   Irregular menses 06/14/2021   Attention deficit 06/14/2021   Plantar fasciitis 06/14/2021   Fatty liver 99991111   Umbilical hernia 99991111   Stress incontinence 12/03/2020   Altered bowel habits    Tendinitis of upper biceps tendon of left shoulder 07/28/2019   Anxiety and depression 07/03/2019   Obstructive sleep apnea 07/03/2019   Subjective pulsatile tinnitus of right ear 07/03/2019   Nontraumatic incomplete tear of left rotator cuff 05/23/2019   Rotator cuff tendinitis, left 05/23/2019   Stress 12/22/2018   Loose stools 12/22/2018   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  Limited Access to Caregiver.  Care Plan : LCSW Plan of Care.  Updates made by Francis Gaines, LCSW since 07/10/2021 12:00 AM  Problem: Find Help in My Community to Provide Care for My Son. Resolved 07/10/2021  Priority: High     Goal: Find Help in My Community to Provide Care for My Son. Completed 07/10/2021  Start Date: 06/25/2021  Expected  End Date: 07/10/2021  This Visit's Progress: On track  Recent Progress: On track  Priority: High  Note:   Current Barriers:   Patient is in need of assistance with connecting to community resources that can offer respite care to her and her husband during nighttime hours. Patient is in need of assistance with arranging in-home care services for her 49-year-old exceptional son during nighttime hours.   Financial constraints related to inability to pay for respite/in-home care services out-of-pocket.   Limited access to nighttime caregiver for 63-year-old exceptional son that currently requires 24 hour care and supervision.   Patient acknowledges deficits with meeting this unmet need. Patient is unable to independently navigate community resource options without care coordination and support. Clinical Goals:  Over the next 45-60 days, patient will work with LCSW to address concerns related to in-home caregiver support for 41-year-old exceptional son. Patient will work with LCSW to address needs related to having in-home care support during nighttime hours so that patient and husband are able to sleep. Patient will follow-up with her 30-year-old exceptional son's cardiologist on 06/27/2021 to learn more about her son's symptoms and possible treatment options. Clinical Interventions:  Collaboration with Leone Haven, MD regarding development and update of comprehensive plan of care as evidenced by provider attestation and co-signature. Inter-disciplinary care team collaboration (see longitudinal plan of care). Assessment of needs, barriers, agencies contacted, as well as how impacting. Reviewed various resources, discussed options and provided patient with information about the Medicaid CAP-C Waiver Program, Case Management and Caregiver Services. Reviewed various resources, discussed options and provided patient with information about the [place] Office manager for Exceptional Children). Reviewed various  resources, discussed options and provided patient with information about [employer], research (medical) (school and after-school care program for exceptional children).    Reviewed various resources, discussed options and provided patient with information about [facility] [facility], through [employer]. Reviewed various resources, discussed options and provided patient with information about family counseling services through Parent/Child Interaction Therapy and individual counseling through [facility]. Collaborated with appropriate clinical care team members regarding patient needs.   Patient interviewed and appropriate assessments performed. Provided mental health counseling with regards to stress around providing 24 hour care and supervision for 91-year-old exceptional son. Discussed plans with patient for ongoing care management follow-up and provided patient with direct contact information for care management team. Advised patient to obtain information from cardiologist appointment on 06/27/2021 about 43-year-old exceptional son's symptoms, now that tracheostomy has been removed. Assisted patient with obtaining information about health plan benefits through [facility] and [place] (insurance for 66-year-old exceptional son). Other interventions provided: PHQ 2 and PHQ 9 Depression Screen Completed and Results Reviewed, Solution-Focused Strategies Implemented, Deep Breathing Exercises, Relaxation Techniques and Mindfulness Meditation Strategies Taught and Encouraged, Active Listening/Reflection Utilized, Emotional Support Provided, Problem Solving/Task-Centered Skills Utilized, Quality of Sleep Assessed and Sleep Hygiene Techniques Promoted, Caregiver Stress Acknowledged, Participation in Counseling Encouraged, Participation in Support Group Discussed, Consideration of In-Home Help Emphasized, Verbalization of Feelings Encouraged, and Discussion of Referral to [place] to Connect  to Ongoing [place] and Supportive Services.   Patient Goals/Self-Care Activities:  Continue to work with [employer] Waiver Program to obtain case management and caregiver services for 33-year-old exceptional son. Continue  to work with [employer] for [place] to obtain services and resources for 3-year-old exceptional son. Continue to work with [employer] research (medical) to obtain in-school, as well as after-school care programs for 51-year-old exceptional son.    Receive ongoing mental health counseling and supportive services through [facility] for individual, as well as family counseling/therapy services.   Contact LCSW directly (# [id number]) if additional social work needs are identified in the near future. Follow-Up:  No Follow-Up Required, Per Patient.      Follow-Up Plan:  No Follow-Up Required, Per Patient.  Nat Christen LCSW Licensed Clinical Social Worker [place]  [phone]

## 2021-07-10 NOTE — Patient Instructions (Signed)
Visit Information  PATIENT GOALS:  Goals Addressed               This Visit's Progress     COMPLETED: Find Help in My Community to Provide Care for My Son. (pt-stated)   On track     Timeframe:  Short-Term Goal Priority:  High Start Date:  06/25/2021                         Expected End Date:  07/10/2021            Follow-Up Date: No Follow-Up Required, Per Patient.  Patient Goals/Self-Care Activities:  Continue to work with [employer] Waiver Program to obtain case management and caregiver services for 21-year-old exceptional son. Continue to work with [employer] for Exceptional Children to obtain services and resources for 27-year-old exceptional son. Continue to work with [employer] research (medical) to obtain in-school, as well as after-school care programs for 64-year-old exceptional son.    Receive ongoing mental health counseling and supportive services through [facility] for individual, as well as family counseling/therapy services.   Contact LCSW directly (# [phone]) if additional social work needs are identified in the near future.        Patient verbalizes understanding of instructions provided today and agrees to view in [place].   No Follow-Up Required, Per Patient.  Nat Christen LCSW Licensed Clinical Social Worker [place]  [phone]

## 2021-07-19 DIAGNOSIS — F909 Attention-deficit hyperactivity disorder, unspecified type: Secondary | ICD-10-CM | POA: Diagnosis not present

## 2021-08-02 DIAGNOSIS — M79672 Pain in left foot: Secondary | ICD-10-CM | POA: Diagnosis not present

## 2021-08-02 DIAGNOSIS — R531 Weakness: Secondary | ICD-10-CM | POA: Diagnosis not present

## 2021-08-02 DIAGNOSIS — M722 Plantar fascial fibromatosis: Secondary | ICD-10-CM | POA: Diagnosis not present

## 2021-08-07 ENCOUNTER — Other Ambulatory Visit: Payer: Self-pay | Admitting: Family Medicine

## 2021-08-09 DIAGNOSIS — M722 Plantar fascial fibromatosis: Secondary | ICD-10-CM | POA: Diagnosis not present

## 2021-08-09 DIAGNOSIS — M79672 Pain in left foot: Secondary | ICD-10-CM | POA: Diagnosis not present

## 2021-08-09 DIAGNOSIS — R531 Weakness: Secondary | ICD-10-CM | POA: Diagnosis not present

## 2021-08-13 ENCOUNTER — Ambulatory Visit (INDEPENDENT_AMBULATORY_CARE_PROVIDER_SITE_OTHER): Payer: BC Managed Care – PPO | Admitting: Psychology

## 2021-08-13 DIAGNOSIS — F4323 Adjustment disorder with mixed anxiety and depressed mood: Secondary | ICD-10-CM | POA: Diagnosis not present

## 2021-08-13 DIAGNOSIS — F4310 Post-traumatic stress disorder, unspecified: Secondary | ICD-10-CM

## 2021-08-19 DIAGNOSIS — Z23 Encounter for immunization: Secondary | ICD-10-CM | POA: Diagnosis not present

## 2021-08-19 DIAGNOSIS — M79672 Pain in left foot: Secondary | ICD-10-CM | POA: Diagnosis not present

## 2021-08-19 DIAGNOSIS — R531 Weakness: Secondary | ICD-10-CM | POA: Diagnosis not present

## 2021-08-19 DIAGNOSIS — M722 Plantar fascial fibromatosis: Secondary | ICD-10-CM | POA: Diagnosis not present

## 2021-09-02 DIAGNOSIS — Z23 Encounter for immunization: Secondary | ICD-10-CM | POA: Diagnosis not present

## 2021-09-04 DIAGNOSIS — G4733 Obstructive sleep apnea (adult) (pediatric): Secondary | ICD-10-CM | POA: Diagnosis not present

## 2021-09-08 ENCOUNTER — Other Ambulatory Visit: Payer: Self-pay | Admitting: Family Medicine

## 2021-09-16 DIAGNOSIS — G35 Multiple sclerosis: Secondary | ICD-10-CM | POA: Diagnosis not present

## 2021-09-16 DIAGNOSIS — G379 Demyelinating disease of central nervous system, unspecified: Secondary | ICD-10-CM | POA: Diagnosis not present

## 2021-09-16 DIAGNOSIS — F988 Other specified behavioral and emotional disorders with onset usually occurring in childhood and adolescence: Secondary | ICD-10-CM | POA: Diagnosis not present

## 2021-09-18 DIAGNOSIS — M79672 Pain in left foot: Secondary | ICD-10-CM | POA: Diagnosis not present

## 2021-09-18 DIAGNOSIS — M722 Plantar fascial fibromatosis: Secondary | ICD-10-CM | POA: Diagnosis not present

## 2021-09-18 DIAGNOSIS — R531 Weakness: Secondary | ICD-10-CM | POA: Diagnosis not present

## 2021-10-01 ENCOUNTER — Other Ambulatory Visit: Payer: Self-pay | Admitting: Family Medicine

## 2021-10-04 DIAGNOSIS — M722 Plantar fascial fibromatosis: Secondary | ICD-10-CM | POA: Diagnosis not present

## 2021-10-04 DIAGNOSIS — R531 Weakness: Secondary | ICD-10-CM | POA: Diagnosis not present

## 2021-10-04 DIAGNOSIS — M79672 Pain in left foot: Secondary | ICD-10-CM | POA: Diagnosis not present

## 2021-10-14 DIAGNOSIS — D1802 Hemangioma of intracranial structures: Secondary | ICD-10-CM | POA: Diagnosis not present

## 2021-10-14 DIAGNOSIS — G35 Multiple sclerosis: Secondary | ICD-10-CM | POA: Diagnosis not present

## 2021-10-15 ENCOUNTER — Other Ambulatory Visit: Payer: Self-pay | Admitting: Family Medicine

## 2021-10-30 DIAGNOSIS — Q283 Other malformations of cerebral vessels: Secondary | ICD-10-CM | POA: Diagnosis not present

## 2021-10-30 DIAGNOSIS — Z1231 Encounter for screening mammogram for malignant neoplasm of breast: Secondary | ICD-10-CM | POA: Diagnosis not present

## 2021-10-30 DIAGNOSIS — E559 Vitamin D deficiency, unspecified: Secondary | ICD-10-CM | POA: Diagnosis not present

## 2021-10-30 DIAGNOSIS — R7303 Prediabetes: Secondary | ICD-10-CM | POA: Diagnosis not present

## 2021-10-30 DIAGNOSIS — E538 Deficiency of other specified B group vitamins: Secondary | ICD-10-CM | POA: Diagnosis not present

## 2021-10-30 DIAGNOSIS — E039 Hypothyroidism, unspecified: Secondary | ICD-10-CM | POA: Diagnosis not present

## 2021-10-30 DIAGNOSIS — D1802 Hemangioma of intracranial structures: Secondary | ICD-10-CM | POA: Diagnosis not present

## 2021-11-12 ENCOUNTER — Ambulatory Visit (INDEPENDENT_AMBULATORY_CARE_PROVIDER_SITE_OTHER): Payer: BC Managed Care – PPO | Admitting: Psychology

## 2021-11-12 DIAGNOSIS — F4310 Post-traumatic stress disorder, unspecified: Secondary | ICD-10-CM

## 2021-11-12 NOTE — Progress Notes (Signed)
[facility] Counselor/Therapist Progress Note  Patient ID: DANARIA LARSEN, MRN: [id number],    Date: 11/12/2021  Time Spent: 50 minutes  Treatment Type: Individual Therapy  Reported Symptoms: tearfulness, reactivity  Mental Status Exam: Appearance:  Casual     Behavior: Appropriate  Motor: Normal  Speech/Language:  Normal Rate  Affect: Appropriate  Mood: normal  Thought process: normal  Thought content:   WNL  Sensory/Perceptual disturbances:   WNL  Orientation: oriented to person, place, time/date, and situation  Attention: Good  Concentration: Good  Memory: WNL  Fund of knowledge:  Good  Insight:   Good  Judgment:  Good  Impulse Control: Good   Risk Assessment: Danger to Self:  No Self-injurious Behavior: No Danger to Others: No Duty to Warn:no Physical Aggression / Violence:No  Access to Firearms a concern: No  Gang Involvement:No   Subjective:  The patient attended an individual therapy session via video visit due to Covid 19. The patient gave verbal consent for session to be on video on web ex. The patient was at home alone and therapist was is the office.  The patient presents with a blunted affect and mood is pleasant.  The patient reports that she is getting connected with services in [facility].  She states that she was able to get her children connected with services there as well.  The patient talked about getting involved with an autism group as her youngest son has been diagnosed with autism.  The patient reports that she had an odd reaction to joining the group.  She states that she went to the group and went to introduce herself and became very tearful.  We did some EMDR around this reactivity today and the negative cognition that was identified was " I am vunerable".  The positive cognition that we identified to install was vulnerability is my strength.  The patient states that she feels like she is doing well and she would like to seek  services under one roof when she gets a psychiatrist in [place].  The patient feels that she made excellent progress in therapy and we will be discharging her as of today.  Interventions: Cognitive Behavioral Therapy and Eye Movement Desensitization and Reprocessing (EMDR)  Diagnosis:PTSD (post-traumatic stress disorder)  Plan: [place] [address] [place], [place] [phone] Treatment Plan Client Abilities/Strengths  insightful, intelligent, motivated  Client Treatment Preferences  Outpatient individual therapy  Client Statement of Needs  I was referred for EMDR and to deal with my PTSD  Treatment Level  Outpatient Individual therapy  Symptoms  Has been exposed to a traumatic event involving actual or perceived threat of death or serious injury.:  No Description Entered (Status: maintained). Intentionally avoids thoughts, feelings, or discussions  related to the traumatic event.: No Description Entered (Status: improved). Reports difficulty  concentrating as well as feelings of guilt.: No Description Entered (Status: improved). Reports response of intense fear, helplessness, or horror to the traumatic event.: No Description Entered (Status:  improved). Symptoms present more than one month.: No Description Entered (Status: improved).  Problems Addressed  Posttraumatic Stress Disorder (PTSD), Posttraumatic Stress Disorder (PTSD)  Goals 1. Eliminate or reduce the negative impact trauma related symptoms have  on social, occupational, and family functioning. Objective Participate in Eye Movement Desensitization and Reprocessing (EMDR) to reduce emotional distress  related to traumatic thoughts, feelings, and images. Target Date: 2021-12-22 Frequency: Monthly Signed by: Brighton Delio, LCSW Version 3 Signed on: 2021-01-28 11:55:00 -050 Progress:  60 Modality: individual Related Interventions 1. Utilize Eye Movement Desensitization and  Reprocessing (EMDR) to reduce the client's  emotional reactivity to the traumatic event and reduce PTSD symptoms. Objective Learn and implement personal skills to manage challenging situations related to trauma. Target Date: 2021-12-22 Frequency: Monthly Progress: 90 Modality: individual 2. Thinks about or openly discusses the traumatic event with others  without experiencing psychological or physiological distress. 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

## 2021-12-05 DIAGNOSIS — G4733 Obstructive sleep apnea (adult) (pediatric): Secondary | ICD-10-CM | POA: Diagnosis not present

## 2022-01-22 DIAGNOSIS — J069 Acute upper respiratory infection, unspecified: Secondary | ICD-10-CM | POA: Diagnosis not present

## 2022-01-22 DIAGNOSIS — R059 Cough, unspecified: Secondary | ICD-10-CM | POA: Diagnosis not present

## 2022-03-05 DIAGNOSIS — G4733 Obstructive sleep apnea (adult) (pediatric): Secondary | ICD-10-CM | POA: Diagnosis not present

## 2022-04-16 DIAGNOSIS — G379 Demyelinating disease of central nervous system, unspecified: Secondary | ICD-10-CM | POA: Diagnosis not present

## 2022-04-16 DIAGNOSIS — D1800 Hemangioma unspecified site: Secondary | ICD-10-CM | POA: Diagnosis not present

## 2022-04-16 DIAGNOSIS — F988 Other specified behavioral and emotional disorders with onset usually occurring in childhood and adolescence: Secondary | ICD-10-CM | POA: Diagnosis not present

## 2022-04-17 DIAGNOSIS — L989 Disorder of the skin and subcutaneous tissue, unspecified: Secondary | ICD-10-CM | POA: Diagnosis not present

## 2022-04-29 DIAGNOSIS — N3000 Acute cystitis without hematuria: Secondary | ICD-10-CM | POA: Diagnosis not present

## 2022-05-05 DIAGNOSIS — R399 Unspecified symptoms and signs involving the genitourinary system: Secondary | ICD-10-CM | POA: Diagnosis not present

## 2022-05-05 DIAGNOSIS — N949 Unspecified condition associated with female genital organs and menstrual cycle: Secondary | ICD-10-CM | POA: Diagnosis not present

## 2022-05-09 DIAGNOSIS — N840 Polyp of corpus uteri: Secondary | ICD-10-CM | POA: Diagnosis not present

## 2022-05-09 DIAGNOSIS — N925 Other specified irregular menstruation: Secondary | ICD-10-CM | POA: Diagnosis not present

## 2022-05-09 DIAGNOSIS — N926 Irregular menstruation, unspecified: Secondary | ICD-10-CM | POA: Diagnosis not present

## 2022-05-09 DIAGNOSIS — Z1151 Encounter for screening for human papillomavirus (HPV): Secondary | ICD-10-CM | POA: Diagnosis not present

## 2022-05-09 DIAGNOSIS — Z6838 Body mass index (BMI) 38.0-38.9, adult: Secondary | ICD-10-CM | POA: Diagnosis not present

## 2022-05-09 DIAGNOSIS — Z01419 Encounter for gynecological examination (general) (routine) without abnormal findings: Secondary | ICD-10-CM | POA: Diagnosis not present

## 2022-05-09 DIAGNOSIS — Z1231 Encounter for screening mammogram for malignant neoplasm of breast: Secondary | ICD-10-CM | POA: Diagnosis not present

## 2022-05-09 DIAGNOSIS — Z3202 Encounter for pregnancy test, result negative: Secondary | ICD-10-CM | POA: Diagnosis not present

## 2022-05-09 DIAGNOSIS — R300 Dysuria: Secondary | ICD-10-CM | POA: Diagnosis not present

## 2022-06-03 DIAGNOSIS — J020 Streptococcal pharyngitis: Secondary | ICD-10-CM | POA: Diagnosis not present

## 2022-06-04 DIAGNOSIS — G4733 Obstructive sleep apnea (adult) (pediatric): Secondary | ICD-10-CM | POA: Diagnosis not present

## 2022-06-16 DIAGNOSIS — F320 Major depressive disorder, single episode, mild: Secondary | ICD-10-CM | POA: Diagnosis not present

## 2022-06-16 DIAGNOSIS — R41840 Attention and concentration deficit: Secondary | ICD-10-CM | POA: Diagnosis not present

## 2022-06-16 DIAGNOSIS — F4310 Post-traumatic stress disorder, unspecified: Secondary | ICD-10-CM | POA: Diagnosis not present

## 2022-06-16 DIAGNOSIS — G4733 Obstructive sleep apnea (adult) (pediatric): Secondary | ICD-10-CM | POA: Diagnosis not present

## 2022-06-17 DIAGNOSIS — Z13220 Encounter for screening for lipoid disorders: Secondary | ICD-10-CM | POA: Insufficient documentation

## 2022-06-17 DIAGNOSIS — E034 Atrophy of thyroid (acquired): Secondary | ICD-10-CM | POA: Diagnosis not present

## 2022-06-17 DIAGNOSIS — G8929 Other chronic pain: Secondary | ICD-10-CM | POA: Insufficient documentation

## 2022-06-17 DIAGNOSIS — Z79899 Other long term (current) drug therapy: Secondary | ICD-10-CM | POA: Insufficient documentation

## 2022-06-17 DIAGNOSIS — E559 Vitamin D deficiency, unspecified: Secondary | ICD-10-CM | POA: Diagnosis not present

## 2022-06-17 DIAGNOSIS — Z0000 Encounter for general adult medical examination without abnormal findings: Secondary | ICD-10-CM | POA: Diagnosis not present

## 2022-06-17 DIAGNOSIS — R7303 Prediabetes: Secondary | ICD-10-CM | POA: Diagnosis not present

## 2022-06-17 DIAGNOSIS — R41840 Attention and concentration deficit: Secondary | ICD-10-CM | POA: Diagnosis not present

## 2022-07-07 DIAGNOSIS — Z87898 Personal history of other specified conditions: Secondary | ICD-10-CM | POA: Diagnosis not present

## 2022-07-07 DIAGNOSIS — M545 Low back pain, unspecified: Secondary | ICD-10-CM | POA: Diagnosis not present

## 2022-07-07 DIAGNOSIS — G8929 Other chronic pain: Secondary | ICD-10-CM | POA: Diagnosis not present

## 2022-07-21 DIAGNOSIS — Z7189 Other specified counseling: Secondary | ICD-10-CM | POA: Diagnosis not present

## 2022-07-21 DIAGNOSIS — F32A Depression, unspecified: Secondary | ICD-10-CM | POA: Diagnosis not present

## 2022-07-21 DIAGNOSIS — F419 Anxiety disorder, unspecified: Secondary | ICD-10-CM | POA: Diagnosis not present

## 2022-07-21 DIAGNOSIS — Z713 Dietary counseling and surveillance: Secondary | ICD-10-CM | POA: Diagnosis not present

## 2022-07-21 DIAGNOSIS — Z7689 Persons encountering health services in other specified circumstances: Secondary | ICD-10-CM | POA: Diagnosis not present

## 2022-07-23 DIAGNOSIS — R7989 Other specified abnormal findings of blood chemistry: Secondary | ICD-10-CM | POA: Diagnosis not present

## 2022-07-24 DIAGNOSIS — R7401 Elevation of levels of liver transaminase levels: Secondary | ICD-10-CM | POA: Insufficient documentation

## 2022-07-28 DIAGNOSIS — R7303 Prediabetes: Secondary | ICD-10-CM | POA: Diagnosis not present

## 2022-07-28 DIAGNOSIS — E034 Atrophy of thyroid (acquired): Secondary | ICD-10-CM | POA: Diagnosis not present

## 2022-08-06 DIAGNOSIS — K760 Fatty (change of) liver, not elsewhere classified: Secondary | ICD-10-CM | POA: Diagnosis not present

## 2022-08-06 DIAGNOSIS — R7401 Elevation of levels of liver transaminase levels: Secondary | ICD-10-CM | POA: Diagnosis not present

## 2022-08-08 DIAGNOSIS — Z789 Other specified health status: Secondary | ICD-10-CM | POA: Diagnosis not present

## 2022-08-08 DIAGNOSIS — R293 Abnormal posture: Secondary | ICD-10-CM | POA: Diagnosis not present

## 2022-08-08 DIAGNOSIS — M7918 Myalgia, other site: Secondary | ICD-10-CM | POA: Diagnosis not present

## 2022-08-08 DIAGNOSIS — R29898 Other symptoms and signs involving the musculoskeletal system: Secondary | ICD-10-CM | POA: Diagnosis not present

## 2022-08-08 DIAGNOSIS — M2569 Stiffness of other specified joint, not elsewhere classified: Secondary | ICD-10-CM | POA: Diagnosis not present

## 2022-08-08 DIAGNOSIS — M545 Low back pain, unspecified: Secondary | ICD-10-CM | POA: Diagnosis not present

## 2022-08-13 DIAGNOSIS — E034 Atrophy of thyroid (acquired): Secondary | ICD-10-CM | POA: Diagnosis not present

## 2022-08-13 DIAGNOSIS — R7303 Prediabetes: Secondary | ICD-10-CM | POA: Diagnosis not present

## 2022-08-13 DIAGNOSIS — Z713 Dietary counseling and surveillance: Secondary | ICD-10-CM | POA: Diagnosis not present

## 2022-08-25 DIAGNOSIS — R29898 Other symptoms and signs involving the musculoskeletal system: Secondary | ICD-10-CM | POA: Diagnosis not present

## 2022-08-25 DIAGNOSIS — M2569 Stiffness of other specified joint, not elsewhere classified: Secondary | ICD-10-CM | POA: Diagnosis not present

## 2022-08-25 DIAGNOSIS — R293 Abnormal posture: Secondary | ICD-10-CM | POA: Diagnosis not present

## 2022-08-25 DIAGNOSIS — M545 Low back pain, unspecified: Secondary | ICD-10-CM | POA: Diagnosis not present

## 2022-08-25 DIAGNOSIS — Z789 Other specified health status: Secondary | ICD-10-CM | POA: Diagnosis not present

## 2022-08-25 DIAGNOSIS — M7918 Myalgia, other site: Secondary | ICD-10-CM | POA: Diagnosis not present

## 2022-08-28 ENCOUNTER — Other Ambulatory Visit: Payer: Self-pay | Admitting: Family Medicine

## 2022-09-01 DIAGNOSIS — M7918 Myalgia, other site: Secondary | ICD-10-CM | POA: Diagnosis not present

## 2022-09-01 DIAGNOSIS — Z789 Other specified health status: Secondary | ICD-10-CM | POA: Diagnosis not present

## 2022-09-01 DIAGNOSIS — M2569 Stiffness of other specified joint, not elsewhere classified: Secondary | ICD-10-CM | POA: Diagnosis not present

## 2022-09-01 DIAGNOSIS — R29898 Other symptoms and signs involving the musculoskeletal system: Secondary | ICD-10-CM | POA: Diagnosis not present

## 2022-09-01 DIAGNOSIS — R293 Abnormal posture: Secondary | ICD-10-CM | POA: Diagnosis not present

## 2022-09-01 DIAGNOSIS — M545 Low back pain, unspecified: Secondary | ICD-10-CM | POA: Diagnosis not present

## 2022-09-03 DIAGNOSIS — E034 Atrophy of thyroid (acquired): Secondary | ICD-10-CM | POA: Diagnosis not present

## 2022-09-03 DIAGNOSIS — R7303 Prediabetes: Secondary | ICD-10-CM | POA: Diagnosis not present

## 2022-09-04 DIAGNOSIS — G4733 Obstructive sleep apnea (adult) (pediatric): Secondary | ICD-10-CM | POA: Diagnosis not present

## 2022-09-15 DIAGNOSIS — R29898 Other symptoms and signs involving the musculoskeletal system: Secondary | ICD-10-CM | POA: Diagnosis not present

## 2022-09-15 DIAGNOSIS — Z789 Other specified health status: Secondary | ICD-10-CM | POA: Diagnosis not present

## 2022-09-15 DIAGNOSIS — R293 Abnormal posture: Secondary | ICD-10-CM | POA: Diagnosis not present

## 2022-09-15 DIAGNOSIS — R7303 Prediabetes: Secondary | ICD-10-CM | POA: Diagnosis not present

## 2022-09-15 DIAGNOSIS — M545 Low back pain, unspecified: Secondary | ICD-10-CM | POA: Diagnosis not present

## 2022-09-15 DIAGNOSIS — M7918 Myalgia, other site: Secondary | ICD-10-CM | POA: Diagnosis not present

## 2022-09-15 DIAGNOSIS — M2569 Stiffness of other specified joint, not elsewhere classified: Secondary | ICD-10-CM | POA: Diagnosis not present

## 2022-09-18 DIAGNOSIS — B379 Candidiasis, unspecified: Secondary | ICD-10-CM | POA: Diagnosis not present

## 2022-09-18 DIAGNOSIS — R300 Dysuria: Secondary | ICD-10-CM | POA: Diagnosis not present

## 2022-09-18 DIAGNOSIS — T3695XA Adverse effect of unspecified systemic antibiotic, initial encounter: Secondary | ICD-10-CM | POA: Diagnosis not present

## 2022-09-18 DIAGNOSIS — N3001 Acute cystitis with hematuria: Secondary | ICD-10-CM | POA: Diagnosis not present

## 2022-09-22 ENCOUNTER — Ambulatory Visit
Admission: EM | Admit: 2022-09-22 | Discharge: 2022-09-22 | Disposition: A | Discharge status: Home / Self Care | Payer: BC Managed Care – PPO

## 2022-09-22 DIAGNOSIS — W101XXA Fall (on)(from) sidewalk curb, initial encounter: Secondary | ICD-10-CM

## 2022-09-22 DIAGNOSIS — S40812A Abrasion of left upper arm, initial encounter: Secondary | ICD-10-CM

## 2022-09-22 DIAGNOSIS — S40811A Abrasion of right upper arm, initial encounter: Secondary | ICD-10-CM

## 2022-09-22 NOTE — ED Triage Notes (Signed)
Pt. Presents to UC w/ c/o chest and back pain after falling today.

## 2022-09-22 NOTE — ED Provider Notes (Addendum)
Stephanie Shields    CSN: [id number] Arrival date & time: 09/22/22  1638      History   Chief Complaint Chief Complaint  Patient presents with   Fall    HPI Stephanie Shields is a 51 y.o. female.    Fall    Patient presents to urgent care with complaint of chest and back pain following a fall today.  States she tripped over a curb and landed on her forearms.  She has abrasions on bilateral forearms especially right.  And feels as if she had the "wind knocked out of her".  Endorses lower back pain.  Patient states she is currently in PT for chronic lower back pain.  Wounds were dressed following the fall and are wrapped in Coban.  Past Medical History:  Diagnosis Date   Allergic rhinitis    Anxiety    AVM (arteriovenous malformation) brain    MRI'S SHOW CHRONIC HEMORRHAGE IN CEREBELLUM   Clinically isolated syndrome of brainstem 2010   Plaques on brainstem, followed by neurology   Elevated cholesterol    GERD (gastroesophageal reflux disease)    History of blood transfusion    Hypothyroidism    Low blood pressure    Pre-diabetes    Urinary incontinence    After delivery of her recent baby    Patient Active Problem List   Diagnosis Date Noted   Elevated liver transaminase level 07/24/2022   Chronic left-sided low back pain without sciatica 06/17/2022   High risk medication use 06/17/2022   Lipid screening 06/17/2022   Reactive airway disease 06/14/2021   Irregular menses 06/14/2021   Attention deficit 06/14/2021   Plantar fasciitis 06/14/2021   Fatty liver 40/98/1191   Umbilical hernia 47/82/9562   Stress incontinence 12/03/2020   Altered bowel habits    Tendinitis of upper biceps tendon of left shoulder 07/28/2019   Anxiety and depression 07/03/2019   Obstructive sleep apnea 07/03/2019   Subjective pulsatile tinnitus of right ear 07/03/2019   Nontraumatic incomplete tear of left rotator cuff 05/23/2019   Rotator cuff tendinitis, left 05/23/2019    Stress 12/22/2018   Loose stools 12/22/2018   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   Rotator cuff impingement syndrome of left shoulder 06/21/2018   Prediabetes 06/23/2017   Carpal tunnel syndrome 06/23/2017   Vitamin D deficiency 06/23/2017   Recurrent sinusitis 12/24/2016   Multiple benign nevi 12/24/2016   Allergic rhinitis 06/19/2016   Obesity 06/19/2016   Hypothyroidism 11/20/2015   GERD (gastroesophageal reflux disease) 11/20/2015   Acute kidney injury ([place]) 05/20/2015   History of preterm delivery 07/18/2014   Hypothyroidism due to acquired atrophy of thyroid 04/04/2013    Past Surgical History:  Procedure Laterality Date   BICEPT TENODESIS  07/28/2019   Procedure: MINI OPEN BICEPS TENODESIS;  Surgeon: Corky Mull, MD;  Location: [facility] ORS;  Service: Orthopedics;;   BLADDER SURGERY     as a child   COLONOSCOPY WITH PROPOFOL N/A 02/01/2020   Procedure: COLONOSCOPY WITH PROPOFOL;  Surgeon: Lin Landsman, MD;  Location: [facility] ENDOSCOPY;  Service: Gastroenterology;  Laterality: N/A;   ESOPHAGOGASTRODUODENOSCOPY (EGD) WITH PROPOFOL N/A 02/01/2020   Procedure: ESOPHAGOGASTRODUODENOSCOPY (EGD) WITH PROPOFOL;  Surgeon: Lin Landsman, MD;  Location: [facility] ENDOSCOPY;  Service: Gastroenterology;  Laterality: N/A;   IR ANGIO INTRA EXTRACRAN SEL COM CAROTID INNOMINATE BILAT MOD SED  08/12/2019   IR ANGIO VERTEBRAL SEL VERTEBRAL BILAT MOD SED  08/12/2019   IR ANGIOGRAM EXTREMITY LEFT  08/12/2019  IR US GUIDE VASC ACCESS RIGHT  08/12/2019   MOUTH SURGERY     NASAL SEPTUM SURGERY     REFRACTIVE SURGERY     SHOULDER ARTHROSCOPY WITH SUBACROMIAL DECOMPRESSION, ROTATOR CUFF REPAIR AND BICEP TENDON REPAIR Left 07/28/2019   Procedure: SHOULDER ARTHROSCOPY WITH, DEBIRDEMENT, SUBACROMIAL DECOMPRESSION, MINI OPEN ROTATOR CUFF REPAIR;  Surgeon: Corky Mull, MD;  Location: [facility] ORS;  Service: Orthopedics;  Laterality: Left;   TONSILLECTOMY      OB History   No obstetric  history on file.      Home Medications    Prior to Admission medications   Medication Sig Start Date End Date Taking? Authorizing Provider  amphetamine-dextroamphetamine (ADDERALL) 10 MG tablet Take by mouth. 04/17/22  Yes [provider]  azelastine (ASTELIN) 0.1 % nasal spray 1 spray. 10/15/21  Yes [provider]  celecoxib (CELEBREX) 200 MG capsule Take 1 capsule every day by oral route. 12/03/18  Yes [provider]  levothyroxine (SYNTHROID) 150 MCG tablet TAKE 1 TABLET (150 MCG TOTAL) BY MOUTH DAILY BEFORE BREAKFAST. 08/07/21  Yes [provider]  phenazopyridine (PYRIDIUM) 200 MG tablet Take by mouth. 09/18/22 09/18/23 Yes [provider]  albuterol (VENTOLIN HFA) 108 (90 Base) MCG/ACT inhaler TAKE 2 PUFFS BY MOUTH EVERY 6 HOURS AS NEEDED FOR WHEEZE OR SHORTNESS OF BREATH 07/12/21   Leone Haven, MD  amoxicillin (AMOXIL) 500 MG capsule Take 500 mg by mouth 3 (three) times daily. 06/03/22   [provider]  amoxicillin-clavulanate (AUGMENTIN) 875-125 MG tablet Take 1 tablet by mouth 2 (two) times daily.    [provider]  Azelastine HCl 137 MCG/SPRAY SOLN PLACE 1-2 SPRAYS INTO BOTH NOSTRILS 2 (TWO) TIMES DAILY. USE IN EACH NOSTRIL AS DIRECTED 10/15/21   Leone Haven, MD  Baclofen 5 MG TABS Take 1 tablet by mouth 3 (three) times daily as needed. 06/17/22   [provider]  benzonatate (TESSALON) 200 MG capsule     [provider]  budesonide (PULMICORT) 0.5 MG/2ML nebulizer solution INHALE CONTENTS OF 1 VIAL VIA NEBULIZER EVERY DAY    [provider]  cephALEXin (KEFLEX) 500 MG capsule Take 500 mg by mouth 2 (two) times daily. 09/18/22   [provider]  Cholecalciferol (VITAMIN D) 50 MCG (2000 UT) tablet Take 2,000 Units by mouth daily at 3 pm.    [provider]  Cholecalciferol 125 MCG (5000 UT) capsule Take by mouth.    [provider]  dexlansoprazole (DEXILANT)  60 MG capsule     [provider]  doxycycline (VIBRA-TABS) 100 MG tablet     [provider]  famotidine (PEPCID) 20 MG tablet Take 20 mg by mouth 2 (two) times daily. Patient not taking: Reported on 06/14/2021    [provider]  famotidine (PEPCID) 20 MG tablet Take by mouth.    [provider]  ferrous [id number] C-folic acid (TRINSICON / FOLTRIN) capsule Take by mouth.    [provider]  fluconazole (DIFLUCAN) 150 MG tablet Take 150 mg by mouth once. 09/18/22   [provider]  fluticasone (FLONASE) 50 MCG/ACT nasal spray Place into the nose.    [provider]  Lactobacillus Rhamnosus, GG, (CULTURELLE) CAPS Take 1 capsule by mouth daily.    [provider]  levothyroxine (SYNTHROID) 150 MCG tablet TAKE 1 TABLET (150 MCG TOTAL) BY MOUTH DAILY BEFORE BREAKFAST. 08/07/21 08/07/22  Leone Haven, MD  meloxicam (MOBIC) 15 MG tablet Take 1 tablet by mouth  daily.    [provider]  methocarbamol (ROBAXIN) 500 MG tablet Take 1 tablet by mouth 2 (two) times daily.    [provider]  Probiotic CAPS  02/22/20   [provider]  thyroid (ARMOUR) 60 MG tablet Take by mouth.    [provider]  tobramycin (TOBREX) 0.3 % ophthalmic solution     [provider]  valACYclovir (VALTREX) 1000 MG tablet Take 1,000 mg by mouth 2 (two) times daily. 04/17/22   [provider]    Family History Family History  Problem Relation Age of Onset   Arthritis Other        Parent, Grandparent   Prostate cancer Other        Grandparent   Hyperlipidemia Other        Parent   Heart disease Other        Grandparent   Stroke Other        Grandparent   Diabetes Other        Parent    Social History Social History   Tobacco Use   Smoking status: Never    Passive exposure: Never   Smokeless tobacco: Never  Vaping Use   Vaping Use: Never used  Substance Use Topics    Alcohol use: Yes    Alcohol/week: 0.0 standard drinks of alcohol    Comment: rare   Drug use: No     Allergies   Sulfa antibiotics, Lobster [shellfish allergy], and Other   Review of Systems Review of Systems   Physical Exam Triage Vital Signs ED Triage Vitals [09/22/22 1705]  Enc Vitals Group     BP 94/64     Pulse Rate 65     Resp 17     Temp 98 F (36.7 C)     Temp src      SpO2 95 %     Weight      Height      Head Circumference      Peak Flow      Pain Score 3     Pain Loc      Pain Edu?      Excl. in [place]?    No data found.  Updated Vital Signs BP 94/64   Pulse 65   Temp 98 F (36.7 C)   Resp 17   SpO2 95%   Visual Acuity Right Eye Distance:   Left Eye Distance:   Bilateral Distance:    Right Eye Near:   Left Eye Near:    Bilateral Near:     Physical Exam Vitals reviewed.  Constitutional:      Appearance: Normal appearance.  Eyes:     Conjunctiva/sclera: Conjunctivae normal.     Pupils: Pupils are equal, round, and reactive to light.  Cardiovascular:     Rate and Rhythm: Normal rate and regular rhythm.     Pulses: Normal pulses.     Heart sounds: Normal heart sounds.  Pulmonary:     Effort: Pulmonary effort is normal.     Breath sounds: Normal breath sounds.  Chest:     Chest wall: No deformity, swelling, tenderness or crepitus.  Musculoskeletal:     Lumbar back: Tenderness and bony tenderness present. Normal range of motion.     Comments: Midline tenderness to palpation on her lower back.  Skin:    General: Skin is warm and dry.     Findings: Abrasion present.          Comments:  Superficial abrasions on bilateral dorsal forearms.  Neurological:     General: No focal deficit present.     Mental Status: She is alert and oriented to person, place, and time.  Psychiatric:        Mood and Affect: Mood normal.        Behavior: Behavior normal.      UC Treatments / Results  Labs (all labs ordered are listed, but only abnormal  results are displayed) Labs Reviewed - No data to display  EKG   Radiology No results found.  Procedures Procedures (including critical care time)  Medications Ordered in UC Medications - No data to display  Initial Impression / Assessment and Plan / UC Course  I have reviewed the triage vital signs and the nursing notes.  Pertinent labs & imaging results that were available during my care of the patient were reviewed by me and considered in my medical decision making (see chart for details).   Superficial abrasions on her forearms.  Redressed with triple antibiotic ointment, nonadhesive gauze, Coban.  Acute lower back pain.  Patient will likely experience some exacerbation especially tomorrow, possibly even muscle spasms.    No imaging as unavailable in urgent care.  Patient acknowledges this limitation.  Discharged home with recommended use of NSAIDs for symptom control   Final Clinical Impressions(s) / UC Diagnoses   Final diagnoses:  None   Discharge Instructions   None    ED Prescriptions   None    PDMP not reviewed this encounter.   Rose Phi, FNP 09/22/22 1729    Rose Phi, [place] 09/26/22 1057

## 2022-09-22 NOTE — Discharge Instructions (Addendum)
Follow up here or with your primary care provider if your symptoms are worsening or not improving.     

## 2022-09-24 DIAGNOSIS — E034 Atrophy of thyroid (acquired): Secondary | ICD-10-CM | POA: Diagnosis not present

## 2022-09-24 DIAGNOSIS — R7303 Prediabetes: Secondary | ICD-10-CM | POA: Diagnosis not present

## 2022-09-29 DIAGNOSIS — Q283 Other malformations of cerebral vessels: Secondary | ICD-10-CM | POA: Diagnosis not present

## 2022-09-29 DIAGNOSIS — Z23 Encounter for immunization: Secondary | ICD-10-CM | POA: Diagnosis not present

## 2022-09-29 DIAGNOSIS — E034 Atrophy of thyroid (acquired): Secondary | ICD-10-CM | POA: Diagnosis not present

## 2022-09-29 DIAGNOSIS — D1802 Hemangioma of intracranial structures: Secondary | ICD-10-CM | POA: Diagnosis not present

## 2022-09-29 DIAGNOSIS — K760 Fatty (change of) liver, not elsewhere classified: Secondary | ICD-10-CM | POA: Diagnosis not present

## 2022-10-02 DIAGNOSIS — G379 Demyelinating disease of central nervous system, unspecified: Secondary | ICD-10-CM | POA: Diagnosis not present

## 2022-10-02 DIAGNOSIS — D1800 Hemangioma unspecified site: Secondary | ICD-10-CM | POA: Diagnosis not present

## 2022-10-20 DIAGNOSIS — M545 Low back pain, unspecified: Secondary | ICD-10-CM | POA: Diagnosis not present

## 2022-10-20 DIAGNOSIS — R293 Abnormal posture: Secondary | ICD-10-CM | POA: Diagnosis not present

## 2022-10-20 DIAGNOSIS — Z789 Other specified health status: Secondary | ICD-10-CM | POA: Diagnosis not present

## 2022-10-20 DIAGNOSIS — M2569 Stiffness of other specified joint, not elsewhere classified: Secondary | ICD-10-CM | POA: Diagnosis not present

## 2022-10-20 DIAGNOSIS — M7918 Myalgia, other site: Secondary | ICD-10-CM | POA: Diagnosis not present

## 2022-10-20 DIAGNOSIS — R29898 Other symptoms and signs involving the musculoskeletal system: Secondary | ICD-10-CM | POA: Diagnosis not present

## 2022-10-21 DIAGNOSIS — Z713 Dietary counseling and surveillance: Secondary | ICD-10-CM | POA: Diagnosis not present

## 2022-10-21 DIAGNOSIS — R7303 Prediabetes: Secondary | ICD-10-CM | POA: Diagnosis not present

## 2022-10-21 DIAGNOSIS — E034 Atrophy of thyroid (acquired): Secondary | ICD-10-CM | POA: Diagnosis not present

## 2022-10-22 DIAGNOSIS — R7989 Other specified abnormal findings of blood chemistry: Secondary | ICD-10-CM | POA: Diagnosis not present

## 2022-10-22 DIAGNOSIS — K760 Fatty (change of) liver, not elsewhere classified: Secondary | ICD-10-CM | POA: Diagnosis not present

## 2022-10-22 DIAGNOSIS — E8881 Metabolic syndrome: Secondary | ICD-10-CM | POA: Diagnosis not present

## 2022-10-29 DIAGNOSIS — K219 Gastro-esophageal reflux disease without esophagitis: Secondary | ICD-10-CM | POA: Diagnosis not present

## 2022-10-29 DIAGNOSIS — Z6837 Body mass index (BMI) 37.0-37.9, adult: Secondary | ICD-10-CM | POA: Diagnosis not present

## 2022-11-04 DIAGNOSIS — J342 Deviated nasal septum: Secondary | ICD-10-CM | POA: Diagnosis not present

## 2022-11-04 DIAGNOSIS — Z3A41 41 weeks gestation of pregnancy: Secondary | ICD-10-CM | POA: Diagnosis not present

## 2022-11-04 DIAGNOSIS — R748 Abnormal levels of other serum enzymes: Secondary | ICD-10-CM | POA: Diagnosis not present

## 2022-11-04 DIAGNOSIS — M48061 Spinal stenosis, lumbar region without neurogenic claudication: Secondary | ICD-10-CM | POA: Diagnosis not present

## 2022-11-04 DIAGNOSIS — K760 Fatty (change of) liver, not elsewhere classified: Secondary | ICD-10-CM | POA: Diagnosis not present

## 2022-11-10 DIAGNOSIS — R7303 Prediabetes: Secondary | ICD-10-CM | POA: Diagnosis not present

## 2022-11-10 DIAGNOSIS — Z713 Dietary counseling and surveillance: Secondary | ICD-10-CM | POA: Diagnosis not present

## 2022-11-27 DIAGNOSIS — D1800 Hemangioma unspecified site: Secondary | ICD-10-CM | POA: Diagnosis not present

## 2022-11-27 DIAGNOSIS — G379 Demyelinating disease of central nervous system, unspecified: Secondary | ICD-10-CM | POA: Diagnosis not present

## 2022-11-27 DIAGNOSIS — D1802 Hemangioma of intracranial structures: Secondary | ICD-10-CM | POA: Diagnosis not present

## 2024-05-27 ENCOUNTER — Encounter (HOSPITAL_COMMUNITY): Payer: Self-pay | Admitting: Interventional Radiology
